# Patient Record
Sex: Female | Born: 1955 | Race: Black or African American | Hispanic: No | Marital: Single | Smoking: Former smoker
Health system: Southern US, Community
[De-identification: ages and names within clinical notes are randomized; demographics above are authoritative.]

## PROBLEM LIST (undated history)

## (undated) DIAGNOSIS — Z8489 Family history of other specified conditions: Secondary | ICD-10-CM

## (undated) DIAGNOSIS — Z972 Presence of dental prosthetic device (complete) (partial): Secondary | ICD-10-CM

## (undated) DIAGNOSIS — T8859XA Other complications of anesthesia, initial encounter: Secondary | ICD-10-CM

## (undated) DIAGNOSIS — I10 Essential (primary) hypertension: Secondary | ICD-10-CM

## (undated) DIAGNOSIS — T4145XA Adverse effect of unspecified anesthetic, initial encounter: Secondary | ICD-10-CM

## (undated) HISTORY — PX: ABDOMINAL HYSTERECTOMY: SHX81

## (undated) HISTORY — PX: COLONOSCOPY: SHX174

---

## 2015-12-09 ENCOUNTER — Emergency Department
Admission: EM | Admit: 2015-12-09 | Discharge: 2015-12-09 | Disposition: A | Discharge status: Home / Self Care | Payer: BLUE CROSS/BLUE SHIELD | Attending: Emergency Medicine | Admitting: Emergency Medicine

## 2015-12-09 ENCOUNTER — Emergency Department: Payer: BLUE CROSS/BLUE SHIELD

## 2015-12-09 ENCOUNTER — Encounter: Payer: Self-pay | Admitting: Emergency Medicine

## 2015-12-09 DIAGNOSIS — M5441 Lumbago with sciatica, right side: Secondary | ICD-10-CM | POA: Diagnosis not present

## 2015-12-09 DIAGNOSIS — I10 Essential (primary) hypertension: Secondary | ICD-10-CM | POA: Insufficient documentation

## 2015-12-09 DIAGNOSIS — R52 Pain, unspecified: Secondary | ICD-10-CM

## 2015-12-09 DIAGNOSIS — M545 Low back pain: Secondary | ICD-10-CM | POA: Diagnosis present

## 2015-12-09 HISTORY — DX: Essential (primary) hypertension: I10

## 2015-12-09 MED ORDER — NAPROXEN 500 MG PO TABS: 500.0000 mg | ORAL_TABLET | Freq: Two times a day (BID) | ORAL | Status: DC

## 2015-12-09 MED ORDER — OXYCODONE-ACETAMINOPHEN 5-325 MG PO TABS: 1.0000 | ORAL_TABLET | ORAL | Status: DC | PRN

## 2015-12-09 MED ORDER — OXYCODONE-ACETAMINOPHEN 5-325 MG PO TABS
2.0000 | ORAL_TABLET | Freq: Once | ORAL | Status: AC
  Administered 2015-12-09: 2 via ORAL
  Filled 2015-12-09: qty 2

## 2015-12-09 MED ORDER — KETOROLAC TROMETHAMINE 30 MG/ML IJ SOLN
30.0000 mg | Freq: Once | INTRAMUSCULAR | Status: AC
  Administered 2015-12-09: 30 mg via INTRAVENOUS
  Filled 2015-12-09: qty 1

## 2015-12-09 MED ORDER — CYCLOBENZAPRINE HCL 10 MG PO TABS: 10.0000 mg | ORAL_TABLET | Freq: Three times a day (TID) | ORAL | Status: DC | PRN

## 2015-12-09 NOTE — ED Notes (Signed)
Pt to ed with c/o back pain that started yesterday afternoon, worse with walking or activity.  Reports she could have injured it while at work yesterday but not exactly sure of when.

## 2015-12-09 NOTE — ED Provider Notes (Signed)
[facility] Emergency Department Provider Note  ____________________________________________  Time seen: Approximately 11:20 AM  I have reviewed the triage vital signs and the nursing notes.   HISTORY  Chief Complaint Back Pain    HPI Little Ishikawaamela Sternberg is a 60 y.o. female presents for violation of lower right-sided back pain that radiates down to the right leg. Patient denies any known injury patient states the symptoms started yesterday afternoon. Pain is worsened or exacerbated with walking or activity. Minimal relief with rest. She reports the possibility she could've injured it while at work yesterday but not exactly sure when. No known mechanism of injury. Patient is 10 out of 10 with radiation down her leg. [place] does not have a local provider established this time.   Past Medical History  Diagnosis Date  . Hypertension     There are no active problems to display for this patient.   History reviewed. No pertinent past surgical history.  No current outpatient prescriptions on file.  Allergies Review of patient's allergies indicates no known allergies.  History reviewed. No pertinent family history.  Social History Social History  Substance Use Topics  . Smoking status: Never Smoker   . Smokeless tobacco: None  . Alcohol Use: No    Review of Systems Constitutional: No fever/chills Cardiovascular: Denies chest pain. Respiratory: Denies shortness of breath. Gastrointestinal: No abdominal pain.  No nausea, no vomiting.  No diarrhea.  No constipation. Genitourinary: Negative for dysuria. Musculoskeletal: Positive for severe right lower quadrant back pain radiating down her right leg. Skin: Negative for rash. Neurological: Negative for headaches, focal weakness or numbness.  10-point ROS otherwise negative.  ____________________________________________   PHYSICAL EXAM: BP 106/51 mmHg  Pulse 73   Temp(Src) 98.2 F (36.8 C) (Oral)  Resp 18  SpO2 98%   VITAL SIGNS: ED Triage Vitals  Enc Vitals Group     BP --      Pulse --      Resp --      Temp --      Temp src --      SpO2 --      Weight --      Height --      Head Cir --      Peak Flow --      Pain Score 12/09/15 1100 10     Pain Loc --      Pain Edu? --      Excl. in GC? --     Constitutional: Alert and oriented. Well appearing and in no acute distress.   Cardiovascular: Normal rate, regular rhythm. Grossly normal heart sounds.  Good peripheral circulation. Respiratory: Normal respiratory effort.  No retractions. Lungs CTAB. Gastrointestinal: Soft and nontender. No distention. No abdominal bruits. No CVA tenderness. Musculoskeletal: No lower extremity tenderness nor edema.  No joint effusions. Straight leg raise negative distal neurovascularly intact bilaterally Neurologic:  Normal speech and language. No gross focal neurologic deficits are appreciated. No gait instability. Skin:  Skin is warm, dry and intact. No rash noted. Psychiatric: Mood and affect are normal. Speech and behavior are normal.  ____________________________________________   LABS (all labs ordered are listed, but only abnormal results are displayed)  Labs Reviewed - No data to display ____________________________________________   RADIOLOGY  FINDINGS: Frontal, lateral, and spot lumbosacral lateral images were obtained. There are 5 non-rib-bearing lumbar type vertebral bodies. There is no fracture or spondylolisthesis. There is mild disc space narrowing at L4-5 and  L5-S1. Other disc spaces appear unremarkable. There are anterior osteophytes at L3 and L4. There are foci of calcification in the abdominal aorta.  IMPRESSION: Disc space narrowing at L4-5 and L5-S1. No fracture or spondylolisthesis. Scattered foci of atherosclerotic calcification in the aorta. ____________________________________________   PROCEDURES  Procedure(s)  performed: None  Critical Care performed: No  ____________________________________________   INITIAL IMPRESSION / ASSESSMENT AND PLAN / ED COURSE  Pertinent labs & imaging results that were available during my care of the patient were reviewed by me and considered in my medical decision making (see chart for details).  Lower back pain with some disc narrowing. Rx given for Flexeril 10 mg Naprosyn 500 mg and Percocet 5/325. Patient to follow-up with her get [facility]Dr. Carole Binning as needed for continued low back pain. Patient voices no other emergency medical complaints at this time work excuse given 24-48 hours. ____________________________________________   FINAL CLINICAL IMPRESSION(S) / ED DIAGNOSES  Final diagnoses:  None     This chart was dictated using voice recognition software/Dragon. Despite best efforts to proofread, errors can occur which can change the meaning. Any change was purely unintentional.   Evangeline Dakin, PA-C 12/09/15 1249  Myrna Blazer, MD 12/09/15 [phone]

## 2015-12-09 NOTE — ED Notes (Signed)
Pt back from x-ray.

## 2015-12-09 NOTE — ED Notes (Signed)
Right lower back pain that radiates to right lower leg. No known injury. Pt took Tylenol for pain with no relief yesterday.

## 2015-12-09 NOTE — Discharge Instructions (Signed)
Cryotherapy °Cryotherapy means treatment with cold. Ice or gel packs can be used to reduce both pain and swelling. Ice is the most helpful within the first 24 to 48 hours after an injury or flare-up from overusing a muscle or joint. Sprains, strains, spasms, burning pain, shooting pain, and aches can all be eased with ice. Ice can also be used when recovering from surgery. Ice is effective, has very few side effects, and is safe for most people to use. °PRECAUTIONS  °Ice is not a safe treatment option for people with: °· Raynaud phenomenon. This is a condition affecting small blood vessels in the extremities. Exposure to cold may cause your problems to return. °· Cold hypersensitivity. There are many forms of cold hypersensitivity, including: °· Cold urticaria. Red, itchy hives appear on the skin when the tissues begin to warm after being iced. °· Cold erythema. This is a red, itchy rash caused by exposure to cold. °· Cold hemoglobinuria. Red blood cells break down when the tissues begin to warm after being iced. The hemoglobin that carry oxygen are passed into the urine because they cannot combine with blood proteins fast enough. °· Numbness or altered sensitivity in the area being iced. °If you have any of the following conditions, do not use ice until you have discussed cryotherapy with your caregiver: °· Heart conditions, such as arrhythmia, angina, or chronic heart disease. °· High blood pressure. °· Healing wounds or open skin in the area being iced. °· Current infections. °· Rheumatoid arthritis. °· Poor circulation. °· Diabetes. °Ice slows the blood flow in the region it is applied. This is beneficial when trying to stop inflamed tissues from spreading irritating chemicals to surrounding tissues. However, if you expose your skin to cold temperatures for too long or without the proper protection, you can damage your skin or nerves. Watch for signs of skin damage due to cold. °HOME CARE INSTRUCTIONS °Follow  these tips to use ice and cold packs safely. °· Place a dry or damp towel between the ice and skin. A damp towel will cool the skin more quickly, so you may need to shorten the time that the ice is used. °· For a more rapid response, add gentle compression to the ice. °· Ice for no more than 10 to 20 minutes at a time. The bonier the area you are icing, the less time it will take to get the benefits of ice. °· Check your skin after 5 minutes to make sure there are no signs of a poor response to cold or skin damage. °· Rest 20 minutes or more between uses. °· Once your skin is numb, you can end your treatment. You can test numbness by very lightly touching your skin. The touch should be so light that you do not see the skin dimple from the pressure of your fingertip. When using ice, most people will feel these normal sensations in this order: cold, burning, aching, and numbness. °· Do not use ice on someone who cannot communicate their responses to pain, such as small children or people with dementia. °HOW TO MAKE AN ICE PACK °Ice packs are the most common way to use ice therapy. Other methods include ice massage, ice baths, and cryosprays. Muscle creams that cause a cold, tingly feeling do not offer the same benefits that ice offers and should not be used as a substitute unless recommended by your caregiver. °To make an ice pack, do one of the following: °· Place crushed ice or a   bag of frozen vegetables in a sealable plastic bag. Squeeze out the excess air. Place this bag inside another plastic bag. Slide the bag into a pillowcase or place a damp towel between your skin and the bag.  Mix 3 parts water with 1 part rubbing alcohol. Freeze the mixture in a sealable plastic bag. When you remove the mixture from the freezer, it will be slushy. Squeeze out the excess air. Place this bag inside another plastic bag. Slide the bag into a pillowcase or place a damp towel between your skin and the bag. SEEK MEDICAL CARE  IF:  You develop white spots on your skin. This may give the skin a blotchy (mottled) appearance.  Your skin turns blue or pale.  Your skin becomes waxy or hard.  Your swelling gets worse. MAKE SURE YOU:   Understand these instructions.  Will watch your condition.  Will get help right away if you are not doing well or get worse.   This information is not intended to replace advice given to you by your health care provider. Make sure you discuss any questions you have with your health care provider.   Document Released: 05/03/2011 Document Revised: 09/27/2014 Document Reviewed: 05/03/2011 Elsevier Interactive Patient Education 2016 [place] therapy can help ease sore, stiff, injured, and tight muscles and joints. Heat relaxes your muscles, which may help ease your pain.  RISKS AND COMPLICATIONS If you have any of the following conditions, do not use heat therapy unless your health care provider has approved:  Poor circulation.  Healing wounds or scarred skin in the area being treated.  Diabetes, heart disease, or high blood pressure.  Not being able to feel (numbness) the area being treated.  Unusual swelling of the area being treated.  Active infections.  Blood clots.  Cancer.  Inability to communicate pain. This may include young children and people who have problems with their brain function (dementia).  Pregnancy. Heat therapy should only be used on old, pre-existing, or long-lasting (chronic) injuries. Do not use heat therapy on new injuries unless directed by your health care provider. HOW TO USE HEAT THERAPY There are several different kinds of heat therapy, including:  Moist heat pack.  Warm water bath.  Hot water bottle.  Electric heating pad.  Heated gel pack.  Heated wrap.  Electric heating pad. Use the heat therapy method suggested by your health care provider. Follow your health care provider's instructions on when  and how to use heat therapy. GENERAL HEAT THERAPY RECOMMENDATIONS  Do not sleep while using heat therapy. Only use heat therapy while you are awake.  Your skin may turn pink while using heat therapy. Do not use heat therapy if your skin turns red.  Do not use heat therapy if you have new pain.  High heat or long exposure to heat can cause burns. Be careful when using heat therapy to avoid burning your skin.  Do not use heat therapy on areas of your skin that are already irritated, such as with a rash or sunburn. SEEK MEDICAL CARE IF:  You have blisters, redness, swelling, or numbness.  You have new pain.  Your pain is worse. MAKE SURE YOU:  Understand these instructions.  Will watch your condition.  Will get help right away if you are not doing well or get worse.   This information is not intended to replace advice given to you by your health care provider. Make sure you discuss any questions you  have with your health care provider.   Document Released: 11/29/2011 Document Revised: 09/27/2014 Document Reviewed: 10/30/2013 Elsevier Interactive Patient Education 2016 [employer] Inc[employer]  Musculoskeletal Pain Musculoskeletal pain is muscle and boney aches and pains. These pains can occur in any part of the body. Your caregiver may treat you without knowing the cause of the pain. They may treat you if blood or urine tests, X-rays, and other tests were normal.  CAUSES There is often not a definite cause or reason for these pains. These pains may be caused by a type of germ (virus). The discomfort may also come from overuse. Overuse includes working out too hard when your body is not fit. Boney aches also come from weather changes. Bone is sensitive to atmospheric pressure changes. HOME CARE INSTRUCTIONS   Ask when your test results will be ready. Make sure you get your test results.  Only take over-the-counter or prescription medicines for pain, discomfort, or fever as directed by your  caregiver. If you were given medications for your condition, do not drive, operate machinery or power tools, or sign legal documents for 24 hours. Do not drink alcohol. Do not take sleeping pills or other medications that may interfere with treatment.  Continue all activities unless the activities cause more pain. When the pain lessens, slowly resume normal activities. Gradually increase the intensity and duration of the activities or exercise.  During periods of severe pain, bed rest may be helpful. Lay or sit in any position that is comfortable.  Putting ice on the injured area.  Put ice in a bag.  Place a towel between your skin and the bag.  Leave the ice on for 15 to 20 minutes, 3 to 4 times a day.  Follow up with your caregiver for continued problems and no reason can be found for the pain. If the pain becomes worse or does not go away, it may be necessary to repeat tests or do additional testing. Your caregiver may need to look further for a possible cause. SEEK IMMEDIATE MEDICAL CARE IF:  You have pain that is getting worse and is not relieved by medications.  You develop chest pain that is associated with shortness or breath, sweating, feeling sick to your stomach (nauseous), or throw up (vomit).  Your pain becomes localized to the abdomen.  You develop any new symptoms that seem different or that concern you. MAKE SURE YOU:   Understand these instructions.  Will watch your condition.  Will get help right away if you are not doing well or get worse.   This information is not intended to replace advice given to you by your health care provider. Make sure you discuss any questions you have with your health care provider.   Document Released: 09/06/2005 Document Revised: 11/29/2011 Document Reviewed: 05/11/2013 Elsevier Interactive Patient Education 2016 Elsevier Inc.  Sciatica Sciatica is pain, weakness, numbness, or tingling along the path of the sciatic nerve. The  nerve starts in the lower back and runs down the back of each leg. The nerve controls the muscles in the lower leg and in the back of the knee, while also providing sensation to the back of the thigh, lower leg, and the sole of your foot. Sciatica is a symptom of another medical condition. For instance, nerve damage or certain conditions, such as a herniated disk or bone spur on the spine, pinch or put pressure on the sciatic nerve. This causes the pain, weakness, or other sensations normally associated with sciatica. Generally,  sciatica only affects one side of the body. CAUSES   Herniated or slipped disc.  Degenerative disk disease.  A pain disorder involving the narrow muscle in the buttocks (piriformis syndrome).  Pelvic injury or fracture.  Pregnancy.  Tumor (rare). SYMPTOMS  Symptoms can vary from mild to very severe. The symptoms usually travel from the low back to the buttocks and down the back of the leg. Symptoms can include:  Mild tingling or dull aches in the lower back, leg, or hip.  Numbness in the back of the calf or sole of the foot.  Burning sensations in the lower back, leg, or hip.  Sharp pains in the lower back, leg, or hip.  Leg weakness.  Severe back pain inhibiting movement. These symptoms may get worse with coughing, sneezing, laughing, or prolonged sitting or standing. Also, being overweight may worsen symptoms. DIAGNOSIS  Your caregiver will perform a physical exam to look for common symptoms of sciatica. He or she may ask you to do certain movements or activities that would trigger sciatic nerve pain. Other tests may be performed to find the cause of the sciatica. These may include:  Blood tests.  X-rays.  Imaging tests, such as an MRI or CT scan. TREATMENT  Treatment is directed at the cause of the sciatic pain. Sometimes, treatment is not necessary and the pain and discomfort goes away on its own. If treatment is needed, your caregiver may  suggest:  Over-the-counter medicines to relieve pain.  Prescription medicines, such as anti-inflammatory medicine, muscle relaxants, or narcotics.  Applying heat or ice to the painful area.  Steroid injections to lessen pain, irritation, and inflammation around the nerve.  Reducing activity during periods of pain.  Exercising and stretching to strengthen your abdomen and improve flexibility of your spine. Your caregiver may suggest losing weight if the extra weight makes the back pain worse.  Physical therapy.  Surgery to eliminate what is pressing or pinching the nerve, such as a bone spur or part of a herniated disk. HOME CARE INSTRUCTIONS   Only take over-the-counter or prescription medicines for pain or discomfort as directed by your caregiver.  Apply ice to the affected area for 20 minutes, 3-4 times a day for the first 48-72 hours. Then try heat in the same way.  Exercise, stretch, or perform your usual activities if these do not aggravate your pain.  Attend physical therapy sessions as directed by your caregiver.  Keep all follow-up appointments as directed by your caregiver.  Do not wear high heels or shoes that do not provide proper support.  Check your mattress to see if it is too soft. A firm mattress may lessen your pain and discomfort. SEEK IMMEDIATE MEDICAL CARE IF:   You lose control of your bowel or bladder (incontinence).  You have increasing weakness in the lower back, pelvis, buttocks, or legs.  You have redness or swelling of your back.  You have a burning sensation when you urinate.  You have pain that gets worse when you lie down or awakens you at night.  Your pain is worse than you have experienced in the past.  Your pain is lasting longer than 4 weeks.  You are suddenly losing weight without reason. MAKE SURE YOU:  Understand these instructions.  Will watch your condition.  Will get help right away if you are not doing well or get  worse.   This information is not intended to replace advice given to you by your health care provider.  Make sure you discuss any questions you have with your health care provider.   Document Released: 08/31/2001 Document Revised: 05/28/2015 Document Reviewed: 01/16/2012 Elsevier Interactive Patient Education [employer]

## 2015-12-09 NOTE — ED Notes (Signed)
Patient transported to X-ray 

## 2016-04-09 ENCOUNTER — Encounter: Payer: Self-pay | Admitting: *Deleted

## 2016-04-09 DIAGNOSIS — M2011 Hallux valgus (acquired), right foot: Secondary | ICD-10-CM | POA: Insufficient documentation

## 2016-04-14 ENCOUNTER — Encounter
Admission: RE | Disposition: A | Discharge status: Home / Self Care | Payer: Self-pay | Source: Ambulatory Visit | Attending: Podiatry

## 2016-04-14 ENCOUNTER — Ambulatory Visit: Payer: BLUE CROSS/BLUE SHIELD | Admitting: Anesthesiology

## 2016-04-14 ENCOUNTER — Ambulatory Visit
Admission: RE | Admit: 2016-04-14 | Discharge: 2016-04-14 | Disposition: A | Discharge status: Home / Self Care | Payer: BLUE CROSS/BLUE SHIELD | Source: Ambulatory Visit | Attending: Podiatry | Admitting: Podiatry

## 2016-04-14 DIAGNOSIS — Z79899 Other long term (current) drug therapy: Secondary | ICD-10-CM | POA: Diagnosis not present

## 2016-04-14 DIAGNOSIS — Z90710 Acquired absence of both cervix and uterus: Secondary | ICD-10-CM | POA: Diagnosis not present

## 2016-04-14 DIAGNOSIS — M2011 Hallux valgus (acquired), right foot: Secondary | ICD-10-CM | POA: Diagnosis not present

## 2016-04-14 DIAGNOSIS — F17200 Nicotine dependence, unspecified, uncomplicated: Secondary | ICD-10-CM | POA: Insufficient documentation

## 2016-04-14 DIAGNOSIS — I10 Essential (primary) hypertension: Secondary | ICD-10-CM | POA: Diagnosis not present

## 2016-04-14 HISTORY — PX: HALLUX VALGUS LAPIDUS: SHX6626

## 2016-04-14 HISTORY — DX: Adverse effect of unspecified anesthetic, initial encounter: T41.45XA

## 2016-04-14 HISTORY — DX: Other complications of anesthesia, initial encounter: T88.59XA

## 2016-04-14 HISTORY — DX: Presence of dental prosthetic device (complete) (partial): Z97.2

## 2016-04-14 HISTORY — DX: Family history of other specified conditions: Z84.89

## 2016-04-14 SURGERY — BUNIONECTOMY, LAPIDUS
Anesthesia: Regional | Site: Foot | Laterality: Right | Wound class: Clean

## 2016-04-14 MED ORDER — ONDANSETRON HCL 4 MG/2ML IJ SOLN
4.0000 mg | Freq: Once | INTRAMUSCULAR | Status: AC
  Administered 2016-04-14: 4 mg via INTRAVENOUS

## 2016-04-14 MED ORDER — LACTATED RINGERS IV SOLN
INTRAVENOUS | Status: DC
  Administered 2016-04-14: 11:00:00 via INTRAVENOUS

## 2016-04-14 MED ORDER — OXYCODONE-ACETAMINOPHEN 5-325 MG PO TABS: 1.0000 | ORAL_TABLET | ORAL | 0 refills | Status: DC | PRN

## 2016-04-14 MED ORDER — BUPIVACAINE HCL (PF) 0.25 % IJ SOLN
INTRAMUSCULAR | Status: DC | PRN
  Administered 2016-04-14: 16 mL

## 2016-04-14 MED ORDER — PROPOFOL 10 MG/ML IV BOLUS
INTRAVENOUS | Status: DC | PRN
  Administered 2016-04-14: 200 mg via INTRAVENOUS

## 2016-04-14 MED ORDER — OXYCODONE HCL 5 MG PO TABS: 5.0000 mg | ORAL_TABLET | Freq: Once | ORAL | Status: DC | PRN

## 2016-04-14 MED ORDER — ROPIVACAINE HCL 5 MG/ML IJ SOLN
INTRAMUSCULAR | Status: DC | PRN
  Administered 2016-04-14: 30 mL via EPIDURAL

## 2016-04-14 MED ORDER — FENTANYL CITRATE (PF) 100 MCG/2ML IJ SOLN
50.0000 ug | INTRAMUSCULAR | Status: DC | PRN
  Administered 2016-04-14: 100 ug via INTRAVENOUS

## 2016-04-14 MED ORDER — DEXAMETHASONE SODIUM PHOSPHATE 4 MG/ML IJ SOLN
INTRAMUSCULAR | Status: DC | PRN
  Administered 2016-04-14: 4 mg via INTRAVENOUS

## 2016-04-14 MED ORDER — MIDAZOLAM HCL 2 MG/2ML IJ SOLN
1.0000 mg | INTRAMUSCULAR | Status: DC | PRN
  Administered 2016-04-14: 2 mg via INTRAVENOUS

## 2016-04-14 MED ORDER — GLYCOPYRROLATE 0.2 MG/ML IJ SOLN
INTRAMUSCULAR | Status: DC | PRN
  Administered 2016-04-14: 0.2 mg via INTRAVENOUS

## 2016-04-14 MED ORDER — ONDANSETRON HCL 4 MG/2ML IJ SOLN
INTRAMUSCULAR | Status: DC | PRN
  Administered 2016-04-14: 4 mg via INTRAVENOUS

## 2016-04-14 MED ORDER — OXYCODONE HCL 5 MG/5ML PO SOLN: 5.0000 mg | Freq: Once | ORAL | Status: DC | PRN

## 2016-04-14 MED ORDER — LIDOCAINE HCL (CARDIAC) 20 MG/ML IV SOLN
INTRAVENOUS | Status: DC | PRN
  Administered 2016-04-14: 30 mg via INTRATRACHEAL

## 2016-04-14 MED ORDER — PROMETHAZINE HCL 25 MG/ML IJ SOLN
6.2500 mg | INTRAMUSCULAR | Status: DC | PRN
  Administered 2016-04-14: 6.25 mg via INTRAVENOUS

## 2016-04-14 SURGICAL SUPPLY — 43 items
BANDAGE ELASTIC 4 VELCRO NS (GAUZE/BANDAGES/DRESSINGS) ×2 IMPLANT
BENZOIN TINCTURE PRP APPL 2/3 (GAUZE/BANDAGES/DRESSINGS) ×2 IMPLANT
BLADE MED AGGRESSIVE (BLADE) IMPLANT
BLADE OSC/SAGITTAL MD 5.5X18 (BLADE) IMPLANT
BLADE SURG 15 STRL LF DISP TIS (BLADE) IMPLANT
BLADE SURG 15 STRL SS (BLADE)
BNDG ESMARK 4X12 TAN STRL LF (GAUZE/BANDAGES/DRESSINGS) ×2 IMPLANT
BNDG GAUZE 4.5X4.1 6PLY STRL (MISCELLANEOUS) ×2 IMPLANT
BNDG STRETCH 4X75 STRL LF (GAUZE/BANDAGES/DRESSINGS) ×2 IMPLANT
CANISTER SUCT 1200ML W/VALVE (MISCELLANEOUS) ×2 IMPLANT
CONTROL 360 (Bone Implant) ×2 IMPLANT
COVER LIGHT HANDLE UNIVERSAL (MISCELLANEOUS) ×4 IMPLANT
CUFF TOURN SGL QUICK 18 (TOURNIQUET CUFF) ×2 IMPLANT
DRAPE FLUOR MINI C-ARM 54X84 (DRAPES) ×2 IMPLANT
DURAPREP 26ML APPLICATOR (WOUND CARE) ×2 IMPLANT
GAUZE PETRO XEROFOAM 1X8 (MISCELLANEOUS) ×2 IMPLANT
GAUZE SPONGE 4X4 12PLY STRL (GAUZE/BANDAGES/DRESSINGS) ×2 IMPLANT
GLOVE BIO SURGEON STRL SZ7.5 (GLOVE) ×2 IMPLANT
GLOVE INDICATOR 8.0 STRL GRN (GLOVE) ×2 IMPLANT
GOWN STRL REUS W/ TWL LRG LVL3 (GOWN DISPOSABLE) ×2 IMPLANT
GOWN STRL REUS W/TWL LRG LVL3 (GOWN DISPOSABLE) ×2
K-WIRE DBL END TROCAR 6X.045 (WIRE)
K-WIRE DBL END TROCAR 6X.062 (WIRE)
KIT ROOM TURNOVER OR (KITS) ×2 IMPLANT
KWIRE DBL END TROCAR 6X.045 (WIRE) IMPLANT
KWIRE DBL END TROCAR 6X.062 (WIRE) IMPLANT
NS IRRIG 500ML POUR BTL (IV SOLUTION) ×2 IMPLANT
PACK EXTREMITY ARMC (MISCELLANEOUS) ×2 IMPLANT
PAD GROUND ADULT SPLIT (MISCELLANEOUS) ×2 IMPLANT
PIN BALLS 3/8 F/.045 WIRE (MISCELLANEOUS) IMPLANT
RASP SM TEAR CROSS CUT (RASP) ×2 IMPLANT
SAW BLADE OMEGA SURGICAL INST (SAW) ×2 IMPLANT
SPLINT CAST 1 STEP 4X30 (MISCELLANEOUS) ×2 IMPLANT
STOCKINETTE STRL 6IN 960660 (GAUZE/BANDAGES/DRESSINGS) ×2 IMPLANT
STRAP BODY AND KNEE 60X3 (MISCELLANEOUS) ×2 IMPLANT
STRIP CLOSURE SKIN 1/4X4 (GAUZE/BANDAGES/DRESSINGS) ×2 IMPLANT
SUT MNCRL 5-0+ PC-1 (SUTURE) ×1 IMPLANT
SUT MONOCRYL 5-0 (SUTURE) ×1
SUT VIC AB 2-0 SH 27 (SUTURE)
SUT VIC AB 2-0 SH 27XBRD (SUTURE) IMPLANT
SUT VIC AB 3-0 SH 27 (SUTURE) ×1
SUT VIC AB 3-0 SH 27X BRD (SUTURE) ×1 IMPLANT
SUT VIC AB 4-0 FS2 27 (SUTURE) ×2 IMPLANT

## 2016-04-14 NOTE — Anesthesia Preprocedure Evaluation (Addendum)
Anesthesia Evaluation  Patient identified by MRN, date of birth, ID band Patient awake    Reviewed: Allergy & Precautions, NPO status , Patient's Chart, lab work & pertinent test results  Airway Mallampati: II  TM Distance: >3 FB Neck ROM: Full    Dental no notable dental hx.    Pulmonary neg pulmonary ROS, Current Smoker,    Pulmonary exam normal breath sounds clear to auscultation       Cardiovascular hypertension, negative cardio ROS Normal cardiovascular exam Rhythm:Regular Rate:Normal     Neuro/Psych negative neurological ROS  negative psych ROS   GI/Hepatic negative GI ROS, Neg liver ROS,   Endo/Other  negative endocrine ROS  Renal/GU negative Renal ROS  negative genitourinary   Musculoskeletal negative musculoskeletal ROS (+)   Abdominal   Peds negative pediatric ROS (+)  Hematology negative hematology ROS (+)   Anesthesia Other Findings   Reproductive/Obstetrics negative OB ROS                             Anesthesia Physical Anesthesia Plan  ASA: II  Anesthesia Plan: General and Regional   Post-op Pain Management:    Induction: Intravenous  Airway Management Planned:   Additional Equipment:   Intra-op Plan:   Post-operative Plan: Extubation in OR  Informed Consent: I have reviewed the patients History and Physical, chart, labs and discussed the procedure including the risks, benefits and alternatives for the proposed anesthesia with the patient or authorized representative who has indicated his/her understanding and acceptance.   Dental advisory given  Plan Discussed with: CRNA  Anesthesia Plan Comments: (POP block plus LMA)       Anesthesia Quick Evaluation

## 2016-04-14 NOTE — Anesthesia Postprocedure Evaluation (Signed)
Anesthesia Post Note  Patient: Kim Munoz  Procedure(s) Performed: Procedure(s) (LRB): HALLUX VALGUS LAPIDUS CORRECTION RIGHT FOOT (Right)  Patient location during evaluation: PACU Anesthesia Type: General and Regional Level of consciousness: awake and alert Pain management: pain level controlled Vital Signs Assessment: post-procedure vital signs reviewed and stable Respiratory status: spontaneous breathing, nonlabored ventilation, respiratory function stable and patient connected to nasal cannula oxygen Cardiovascular status: blood pressure returned to baseline and stable Postop Assessment: no signs of nausea or vomiting Anesthetic complications: no    Leaira Fullam C

## 2016-04-14 NOTE — Discharge Instructions (Signed)
[facility] °[facility] ° °POST OPERATIVE INSTRUCTIONS FOR DR. TROXLER AND DR. Elwyn Klosinski °[facility] ° ° °1. Take your medication as prescribed.  Pain medication should be taken only as needed. ° °2. Keep the dressing clean, dry and intact. ° °3. Keep your foot elevated above the heart level for the first 48 hours. ° °4. Walking to the bathroom and brief periods of walking are acceptable, unless we have instructed you to be non-weight bearing. ° °5. Always wear your post-op shoe when walking.  Always use your crutches if you are to be non-weight bearing. ° °6. Do not take a shower.   ° °7. Every hour you are awake:  °- Bend your knee 15 times. °- Massage calf 15 times ° °8. Call [facility] ([phone]) if any of the following problems occur: °- You develop a temperature or fever. °- The bandage becomes saturated with blood. °- Medication does not stop your pain. °- Injury of the foot occurs. °- Any symptoms of infection including redness, odor, or red streaks running from wound. ° °General Anesthesia, Adult, Care After °Refer to this sheet in the next few weeks. These instructions provide you with information on caring for yourself after your procedure. Your health care provider may also give you more specific instructions. Your treatment has been planned according to current medical practices, but problems sometimes occur. Call your health care provider if you have any problems or questions after your procedure. °WHAT TO EXPECT AFTER THE PROCEDURE °After the procedure, it is typical to experience: °· Sleepiness. °· Nausea and vomiting. °HOME CARE INSTRUCTIONS °· For the first 24 hours after general anesthesia: °¨ Have a responsible person with you. °¨ Do not drive a car. If you are alone, do not take public transportation. °¨ Do not drink alcohol. °¨ Do not take medicine that has not been prescribed by your health care provider. °¨ Do not sign important  papers or make important decisions. °¨ You may resume a normal diet and activities as directed by your health care provider. °· Change bandages (dressings) as directed. °· If you have questions or problems that seem related to general anesthesia, call the hospital and ask for the anesthetist or anesthesiologist on call. °SEEK MEDICAL CARE IF: °· You have nausea and vomiting that continue the day after anesthesia. °· You develop a rash. °SEEK IMMEDIATE MEDICAL CARE IF:  °· You have difficulty breathing. °· You have chest pain. °· You have any allergic problems. °  °This information is not intended to replace advice given to you by your health care provider. Make sure you discuss any questions you have with your health care provider. °  °Document Released: 12/13/2000 Document Revised: 09/27/2014 Document Reviewed: 01/05/2012 °Elsevier Interactive Patient Education ©2016 [employer] ° °

## 2016-04-14 NOTE — Anesthesia Procedure Notes (Signed)
Anesthesia Regional Block:  Popliteal block  Pre-Anesthetic Checklist: ,, timeout performed, Correct Patient, Correct Site, Correct Laterality, Correct Procedure, Correct Position, site marked, Risks and benefits discussed,  Surgical consent,  Pre-op evaluation,  At surgeon's request and post-op pain management  Laterality: Right  Prep: chloraprep       Needles:  Injection technique: Single-shot  Needle Type: Echogenic Needle     Needle Length: 9cm 9 cm Needle Gauge: 21 and 21 G    Additional Needles:  Procedures: ultrasound guided (picture in chart) Popliteal block Narrative:  Start time: 04/14/2016 11:25 AM End time: 04/14/2016 11:30 AM Injection made incrementally with aspirations every 5 mL.  Performed by: Personally   Additional Notes: Functioning IV was confirmed and monitors applied. Ultrasound guidance: relevant anatomy identified, needle position confirmed, local anesthetic spread visualized around nerve(s)., vascular puncture avoided.  Image printed for medical record.  Negative aspiration and no paresthesias; incremental administration of local anesthetic. The patient tolerated the procedure well. Vitals signes recorded in RN notes.  No needle to nerve contact. No complications. Nerve, local, needle, blood vessels all well visualized throughout procedure.

## 2016-04-14 NOTE — Op Note (Signed)
Operative note   Surgeon:Shoua Ressler Lawyer: None    Preop diagnosis: Right foot hallux valgus    Postop diagnosis: Same    Procedure: Lapidus hallux valgus correction right foot    EBL: Minimal    Anesthesia:regional and general    Hemostasis: Ankle tourniquet inflated to 250 mmHg for 99 minutes    Specimen: None    Complications: None    Operative indications:Kim Munoz is an 60 y.o. that presents today for surgical intervention.  The risks/benefits/alternatives/complications have been discussed and consent has been given.    Procedure:  Patient was brought into the OR and placed on the operating table in thesupine position. After anesthesia was obtained theright lower extremity was prepped and draped in usual sterile fashion.  Attention was directed to the right first metatarsal and met cuneiform joint. A longitudinal incision was made overlying the long extensor tendon. Sharp and blunt dissection carried down to the periosteum. Just medial to the extensor tendon the subperiosteal dissection was undertaken. This exposed the first met cuneiform joint. At this time the joint was then freed of the dorsomedial 6 attachments. A sagittal saw blade was placed into the joint and free the plantar ligamentous structures. The metatarsals mobilized and I was able to rotate this in a better position. Next a distal dissection was undertaken. The joint positioner was then placed. The was placed between the bases of the first metatarsal and second metatarsal region. The joint positioner was then placed on the midtarsal joint region and realignment was noted after repositioning the first metatarsal. A 0 intermetatarsal angle was noted. Next the joint seeker was placed into the joint and the osteotomy cut guide was placed on the dorsal aspect of the first met cuneiform joint. This was then stabilized with K wires. A 7 cut was placed on the cuneiform with the base of the first metatarsal  excised. All loose bodies were then removed. The articular surfaces were then prepared with the 2.0 mm long drill bit. Good bone graft was left within the met cuneiform fusion site. The joint was then manually compressed. It was then compressed with a compression and a stabilizing wire medially. Final placement of plates and screws were placed on the dorsal and medial aspect. This was from theTreace lapiplasty set. Good stability was noted in all planes with excellent realignment noted. All residual K wires and positions were removed prior to final placement. At this time attention was then directed to the dorsal medial first MTPJ where an incision was made. Sharp and blunt dissection was carried down to the capsule a T capsulotomy was performed. The dorsomedial eminence was noted and transected with a power saw and smoothed with a power rasp. The intermetatarsal space was entered the DTI L was transected. The conjoined tendon of the abductor was released and the sesamoid apparatus was freed up. Good realignment of the MTPJ was noted at this time. All wounds were flushed with copious pus or irrigation. Layered closure was performed with a 3-0 Vicryl for the deeper and capsulorrhaphy layer. A small capsulorrhaphy was performed. 4-0 Vicryl for the subcutaneous tissue and a 5-0 Monocryl for the skin. The small stab incision overlying the second metatarsal was closed with a 4-0 nylon. All areas were then infiltrated with 0.25% Marcaine. A well compressive sterile dressing was applied and the patient was placed in a posterior splint.    Patient tolerated the procedure and anesthesia well.  Was transported from the OR to the PACU  with all vital signs stable and vascular status intact. To be discharged per routine protocol.  Will follow up in approximately 1 week in the outpatient clinic. \

## 2016-04-14 NOTE — Anesthesia Procedure Notes (Signed)
Procedure Name: LMA Insertion Date/Time: 04/14/2016 12:07 PM Performed by: Andee Poles Pre-anesthesia Checklist: Patient identified, Emergency Drugs available, Suction available, Timeout performed and Patient being monitored Patient Re-evaluated:Patient Re-evaluated prior to inductionOxygen Delivery Method: Circle system utilized Preoxygenation: Pre-oxygenation with 100% oxygen Intubation Type: IV induction LMA: LMA inserted LMA Size: 4.0 Number of attempts: 1 Placement Confirmation: positive ETCO2 and breath sounds checked- equal and bilateral Tube secured with: Tape

## 2016-04-14 NOTE — Transfer of Care (Signed)
Immediate Anesthesia Transfer of Care Note  Patient: Munachiso Leonhart  Procedure(s) Performed: Procedure(s) with comments: HALLUX VALGUS LAPIDUS (Right) - WITH POPLITEAL BLOCK  Patient Location: PACU  Anesthesia Type: General, Regional  Level of Consciousness: awake, alert  and patient cooperative  Airway and Oxygen Therapy: Patient Spontanous Breathing and Patient connected to supplemental oxygen  Post-op Assessment: Post-op Vital signs reviewed, Patient's Cardiovascular Status Stable, Respiratory Function Stable, Patent Airway and No signs of Nausea or vomiting  Post-op Vital Signs: Reviewed and stable  Complications: No apparent anesthesia complications

## 2016-04-14 NOTE — H&P (Signed)
  HISTORY AND PHYSICAL INTERVAL NOTE:  04/14/2016  11:40 AM  Kim Munoz  has presented today for surgery, with the diagnosis of M20.11 HALLUX VALGUS RIGHT.  The various methods of treatment have been discussed with the patient.  No guarantees were given.  After consideration of risks, benefits and other options for treatment, the patient has consented to surgery.  I have reviewed the patients' chart and labs.    Patient Vitals for the past 24 hrs:  BP Temp Temp src Pulse Resp SpO2 Weight  04/14/16 1130 (!) 112/92 - - 64 16 100 % -  04/14/16 1125 114/72 - - (!) 59 15 100 % -  04/14/16 1120 117/85 - - (!) 58 20 100 % -  04/14/16 1048 139/90 97.8 F (36.6 C) Temporal 69 - 99 % 79.4 kg (175 lb)    A history and physical examination was performed in my office.  The patient was reexamined.  There have been no changes to this history and physical examination.Plan for lapidus hallux valgus correction.  Gwyneth Revels A

## 2016-04-16 ENCOUNTER — Encounter: Payer: Self-pay | Admitting: Podiatry

## 2016-05-10 DIAGNOSIS — Z7185 Encounter for immunization safety counseling: Secondary | ICD-10-CM | POA: Insufficient documentation

## 2016-05-10 DIAGNOSIS — E669 Obesity, unspecified: Secondary | ICD-10-CM | POA: Insufficient documentation

## 2016-06-11 ENCOUNTER — Observation Stay
Admission: EM | Admit: 2016-06-11 | Discharge: 2016-06-13 | Disposition: A | Discharge status: Home / Self Care | Payer: BLUE CROSS/BLUE SHIELD | Attending: Internal Medicine | Admitting: Internal Medicine

## 2016-06-11 ENCOUNTER — Encounter: Payer: Self-pay | Admitting: Emergency Medicine

## 2016-06-11 DIAGNOSIS — R197 Diarrhea, unspecified: Secondary | ICD-10-CM | POA: Diagnosis present

## 2016-06-11 DIAGNOSIS — Z90710 Acquired absence of both cervix and uterus: Secondary | ICD-10-CM | POA: Insufficient documentation

## 2016-06-11 DIAGNOSIS — K529 Noninfective gastroenteritis and colitis, unspecified: Principal | ICD-10-CM | POA: Insufficient documentation

## 2016-06-11 DIAGNOSIS — Z9889 Other specified postprocedural states: Secondary | ICD-10-CM | POA: Diagnosis not present

## 2016-06-11 DIAGNOSIS — F17210 Nicotine dependence, cigarettes, uncomplicated: Secondary | ICD-10-CM | POA: Diagnosis not present

## 2016-06-11 DIAGNOSIS — Z8249 Family history of ischemic heart disease and other diseases of the circulatory system: Secondary | ICD-10-CM | POA: Insufficient documentation

## 2016-06-11 DIAGNOSIS — R112 Nausea with vomiting, unspecified: Secondary | ICD-10-CM | POA: Diagnosis present

## 2016-06-11 DIAGNOSIS — I10 Essential (primary) hypertension: Secondary | ICD-10-CM | POA: Insufficient documentation

## 2016-06-11 LAB — URINALYSIS COMPLETE WITH MICROSCOPIC (ARMC ONLY)
BILIRUBIN URINE: NEGATIVE
Glucose, UA: NEGATIVE mg/dL
Hgb urine dipstick: NEGATIVE
KETONES UR: NEGATIVE mg/dL
LEUKOCYTES UA: NEGATIVE
NITRITE: NEGATIVE
Protein, ur: NEGATIVE mg/dL
SPECIFIC GRAVITY, URINE: 1.014 (ref 1.005–1.030)
pH: 5 (ref 5.0–8.0)

## 2016-06-11 LAB — COMPREHENSIVE METABOLIC PANEL
ALBUMIN: 3.9 g/dL (ref 3.5–5.0)
ALK PHOS: 35 U/L — AB (ref 38–126)
ALT: 40 U/L (ref 14–54)
ANION GAP: 9 (ref 5–15)
AST: 27 U/L (ref 15–41)
BILIRUBIN TOTAL: 0.7 mg/dL (ref 0.3–1.2)
BUN: 13 mg/dL (ref 6–20)
CALCIUM: 9.6 mg/dL (ref 8.9–10.3)
CO2: 23 mmol/L (ref 22–32)
Chloride: 104 mmol/L (ref 101–111)
Creatinine, Ser: 0.87 mg/dL (ref 0.44–1.00)
GFR calc non Af Amer: >60 mL/min (ref >60)
GLUCOSE: 110 mg/dL — AB (ref 65–99)
POTASSIUM: 3.8 mmol/L (ref 3.5–5.1)
SODIUM: 136 mmol/L (ref 135–145)
TOTAL PROTEIN: 7.7 g/dL (ref 6.5–8.1)

## 2016-06-11 LAB — CBC WITH DIFFERENTIAL/PLATELET
Basophils Absolute: 0.1 10*3/uL (ref 0–0.1)
Basophils Relative: 1 %
Eosinophils Absolute: 0.3 10*3/uL (ref 0–0.7)
Eosinophils Relative: 3 %
HCT: 42.3 % (ref 35.0–47.0)
Hemoglobin: 14.8 g/dL (ref 12.0–16.0)
Lymphocytes Relative: 25 %
Lymphs Abs: 2.3 10*3/uL (ref 1.0–3.6)
MCH: 28.7 pg (ref 26.0–34.0)
MCHC: 35 g/dL (ref 32.0–36.0)
MCV: 82.1 fL (ref 80.0–100.0)
Monocytes Absolute: 0.6 10*3/uL (ref 0.2–0.9)
Monocytes Relative: 6 %
Neutro Abs: 6.1 10*3/uL (ref 1.4–6.5)
Neutrophils Relative %: 65 %
Platelets: 285 10*3/uL (ref 150–440)
RBC: 5.15 MIL/uL (ref 3.80–5.20)
RDW: 15.3 % — ABNORMAL HIGH (ref 11.5–14.5)
WBC: 9.4 10*3/uL (ref 3.6–11.0)

## 2016-06-11 LAB — LIPASE, BLOOD: Lipase: 26 U/L (ref 11–51)

## 2016-06-11 MED ORDER — SODIUM CHLORIDE 0.9 % IV SOLN
Freq: Once | INTRAVENOUS | Status: AC
  Administered 2016-06-11: 1000 mL via INTRAVENOUS

## 2016-06-11 MED ORDER — PROMETHAZINE HCL 25 MG/ML IJ SOLN
25.0000 mg | Freq: Once | INTRAMUSCULAR | Status: AC
  Administered 2016-06-11: 25 mg via INTRAVENOUS
  Filled 2016-06-11: qty 1

## 2016-06-11 MED ORDER — ONDANSETRON 4 MG PO TBDP: 4.0000 mg | ORAL_TABLET | Freq: Three times a day (TID) | ORAL | 0 refills | Status: DC | PRN

## 2016-06-11 MED ORDER — PROMETHAZINE HCL 25 MG/ML IJ SOLN
INTRAMUSCULAR | Status: AC
  Administered 2016-06-11: 25 mg via INTRAVENOUS
  Filled 2016-06-11: qty 1

## 2016-06-11 MED ORDER — ONDANSETRON 4 MG PO TBDP
4.0000 mg | ORAL_TABLET | Freq: Once | ORAL | Status: AC
  Administered 2016-06-11: 4 mg via ORAL

## 2016-06-11 MED ORDER — SODIUM CHLORIDE 0.9 % IV SOLN
Freq: Once | INTRAVENOUS | Status: AC
  Administered 2016-06-11: 22:00:00 via INTRAVENOUS

## 2016-06-11 MED ORDER — ONDANSETRON 4 MG PO TBDP
ORAL_TABLET | ORAL | Status: AC
  Administered 2016-06-11: 4 mg via ORAL
  Filled 2016-06-11: qty 1

## 2016-06-11 NOTE — Discharge Instructions (Signed)
Use the Zofran orally disintegrating tablet 3 times a day to help with the nausea. Try some Pepto-Bismol as directed for the diarrhea. Return for lightheadedness, fever, worse pain, if you can't keep down fluids feeling sicker or if you're not any better in 2 days or see your doctor.

## 2016-06-11 NOTE — ED Triage Notes (Signed)
C/o n/v/d.  NAD

## 2016-06-11 NOTE — ED Notes (Signed)
Pt given cup of water for PO challenge

## 2016-06-11 NOTE — ED Provider Notes (Signed)
[facility] Emergency Department Provider Note   ____________________________________________   First MD Initiated Contact with Patient 06/11/16 2008     (approximate)  I have reviewed the triage vital signs and the nursing notes.   HISTORY  Chief Complaint Emesis   HPI Kim Munoz is a 60 y.o. female patient complains of nausea vomiting and diarrhea for 3 days. Patient has no fever. Patient's husband's mother also has diarrhea and lives with them. When also has diarrhea. no one remembers eating any bad food. Patient complains of upper abdominal pain especially in the epigastric area. Patient is unable to keep down any food or water or any other liquid.   Past Medical History:  Diagnosis Date  . Complication of anesthesia    nausea after colonoscopy  . Family history of adverse reaction to anesthesia    sister - nausea aafter surgery  . Hypertension   . Wears dentures    full upper and lower    There are no active problems to display for this patient.   Past Surgical History:  Procedure Laterality Date  . ABDOMINAL HYSTERECTOMY    . COLONOSCOPY    . HALLUX VALGUS LAPIDUS Right 04/14/2016   Procedure: HALLUX VALGUS LAPIDUS CORRECTION RIGHT FOOT;  Surgeon: Gwyneth RevelsJustin Fowler, DPM;  [facility];  Service: Podiatry;  Laterality: Right;  WITH POPLITEAL BLOCK    Prior to Admission medications   Medication Sig Start Date End Date Taking? Authorizing Provider  amLODipine (NORVASC) 10 MG tablet Take 10 mg by mouth daily.    Historical Provider, MD  cyclobenzaprine (FLEXERIL) 10 MG tablet Take 1 tablet (10 mg total) by mouth every 8 (eight) hours as needed for muscle spasms. 12/09/15   Evangeline Dakinharles M Beers, PA-C  naproxen (NAPROSYN) 500 MG tablet Take 1 tablet (500 mg total) by mouth 2 (two) times daily with a meal. 12/09/15   Evangeline Dakinharles M Beers, PA-C  oxyCODONE-acetaminophen (ROXICET) 5-325 MG tablet Take 1-2 tablets by mouth every 4 (four) hours  as needed for severe pain. 12/09/15   Evangeline Dakinharles M Beers, PA-C  oxyCODONE-acetaminophen (ROXICET) 5-325 MG tablet Take 1-2 tablets by mouth every 4 (four) hours as needed for severe pain. 04/14/16   Gwyneth RevelsJustin Fowler, DPM  terbinafine (LAMISIL) 250 MG tablet Take 250 mg by mouth daily.    Historical Provider, MD    Allergies Review of patient's allergies indicates no known allergies.  No family history on file.  Social History Social History  Substance Use Topics  . Smoking status: Current Every Day Smoker    Packs/day: 0.50    Years: 35.00    Types: Cigarettes  . Smokeless tobacco: Never Used  . Alcohol use Yes     Comment: 2 drinks/mo    Review of Systems Constitutional: No fever/chills Eyes: No visual changes. ENT: No sore throat. Cardiovascular: Denies chest pain. Respiratory: Denies shortness of breath. Gastrointestinal:  See history of present illness Genitourinary: Negative for dysuria. Musculoskeletal: Negative for back pain. Skin: Negative for rash. Neurological: Negative for headaches, focal weakness or numbness.  10-point ROS otherwise negative.  ____________________________________________   PHYSICAL EXAM:  VITAL SIGNS: ED Triage Vitals  Enc Vitals Group     BP 06/11/16 1900 (!) 135/98     Pulse Rate 06/11/16 1900 95     Resp 06/11/16 1900 16     Temp 06/11/16 1900 98.1 F (36.7 C)     Temp Source 06/11/16 1900 Oral     SpO2 06/11/16 1900 99 %  Weight --      Height --      Head Circumference --      Peak Flow --      Pain Score 06/11/16 1856 8     Pain Loc --      Pain Edu? --      Excl. in GC? --    Constitutional: Alert and oriented. Well appearing and in no acute distress. Eyes: Conjunctivae are normal. PERRL. EOMI. Head: Atraumatic. Nose: No congestion/rhinnorhea. Mouth/Throat: Mucous membranes are moist.  Oropharynx non-erythematous. Neck: No stridor. Cardiovascular: Normal rate, regular rhythm. Grossly normal heart sounds.  Good  peripheral circulation. Respiratory: Normal respiratory effort.  No retractions. Lungs CTAB. Gastrointestinal: Soft and nontender except for an immediate epigastric area.. No distention. No abdominal bruits. No CVA tenderness. Musculoskeletal: No lower extremity tenderness nor edema.  No joint effusions. Neurologic:  Normal speech and language. No gross focal neurologic deficits are appreciated. No gait instability. Skin:  Skin is warm, dry and intact. No rash noted.   ____________________________________________   LABS (all labs ordered are listed, but only abnormal results are displayed)  Labs Reviewed  CBC WITH DIFFERENTIAL/PLATELET - Abnormal; Notable for the following:       Result Value   RDW 15.3 (*)    All other components within normal limits  COMPREHENSIVE METABOLIC PANEL - Abnormal; Notable for the following:    Glucose, Bld 110 (*)    Alkaline Phosphatase 35 (*)    All other components within normal limits  URINALYSIS COMPLETEWITH MICROSCOPIC ([facility] ONLY) - Abnormal; Notable for the following:    Color, Urine YELLOW (*)    APPearance HAZY (*)    Bacteria, UA RARE (*)    Squamous Epithelial / LPF 6-30 (*)    All other components within normal limits  LIPASE, BLOOD   ____________________________________________  EKG  EKG read and interpreted by me shows normal sinus rhythm rate of 93 normal axis no acute ST-T wave changes ____________________________________________  RADIOLOGY   ____________________________________________   PROCEDURES  Procedure(s) performed:   Procedures  Critical Care performed:   ____________________________________________   INITIAL IMPRESSION / ASSESSMENT AND PLAN / ED COURSE  Pertinent labs & imaging results that were available during my care of the patient were reviewed by me and considered in my medical decision making (see chart for details). After Zofran and Phenergan patient is able to keep fluids down. She is now  sleepy. She reports she has abdominal cramps immediately prior to having diarrhea. Otherwise her belly is not hurting anymore.   Clinical Course     ____________________________________________   FINAL CLINICAL IMPRESSION(S) / ED DIAGNOSES  Final diagnoses:  Nausea vomiting and diarrhea      NEW MEDICATIONS STARTED DURING THIS VISIT:  New Prescriptions   No medications on file     Note:  This document was prepared using Dragon voice recognition software and may include unintentional dictation errors.    Arnaldo Natal, MD 06/11/16 2239

## 2016-06-11 NOTE — ED Notes (Signed)
Pt in via triage with complaints of N/V/D since Wednesday.  Pt complains of right and left upper quadrant abdominal pain since then as well.  Pt reports unable to keep anything solid down and has therefore not eaten since Wed.  Pt A/Ox4, no immediate distress noted at this time.

## 2016-06-11 NOTE — ED Notes (Signed)
Pt to bathroom via wheelchair with one assist.   Pt unable to have BM at this time.

## 2016-06-12 DIAGNOSIS — R112 Nausea with vomiting, unspecified: Secondary | ICD-10-CM | POA: Diagnosis present

## 2016-06-12 LAB — C DIFFICILE QUICK SCREEN W PCR REFLEX
C Diff antigen: NEGATIVE
C Diff interpretation: NOT DETECTED
C Diff toxin: NEGATIVE

## 2016-06-12 LAB — TSH: TSH: 5.184 u[IU]/mL — ABNORMAL HIGH (ref 0.350–4.500)

## 2016-06-12 MED ORDER — ENOXAPARIN SODIUM 40 MG/0.4ML ~~LOC~~ SOLN
40.0000 mg | SUBCUTANEOUS | Status: DC
  Administered 2016-06-12: 40 mg via SUBCUTANEOUS
  Filled 2016-06-12: qty 0.4

## 2016-06-12 MED ORDER — KETOROLAC TROMETHAMINE 30 MG/ML IJ SOLN: 30.0000 mg | Freq: Four times a day (QID) | INTRAMUSCULAR | Status: DC | PRN

## 2016-06-12 MED ORDER — ONDANSETRON HCL 4 MG PO TABS: 4.0000 mg | ORAL_TABLET | Freq: Four times a day (QID) | ORAL | Status: DC | PRN

## 2016-06-12 MED ORDER — ACETAMINOPHEN 325 MG PO TABS: 650.0000 mg | ORAL_TABLET | Freq: Four times a day (QID) | ORAL | Status: DC | PRN

## 2016-06-12 MED ORDER — IBUPROFEN 600 MG PO TABS
600.0000 mg | ORAL_TABLET | Freq: Four times a day (QID) | ORAL | Status: DC | PRN
  Administered 2016-06-12 (×2): 600 mg via ORAL
  Filled 2016-06-12 (×2): qty 1

## 2016-06-12 MED ORDER — DOCUSATE SODIUM 100 MG PO CAPS
100.0000 mg | ORAL_CAPSULE | Freq: Two times a day (BID) | ORAL | Status: DC
  Filled 2016-06-12 (×2): qty 1

## 2016-06-12 MED ORDER — ACETAMINOPHEN 650 MG RE SUPP
650.0000 mg | Freq: Four times a day (QID) | RECTAL | Status: DC | PRN
Start: 2016-06-12 — End: 2016-06-13

## 2016-06-12 MED ORDER — AMLODIPINE BESYLATE 10 MG PO TABS
10.0000 mg | ORAL_TABLET | Freq: Every day | ORAL | Status: DC
  Administered 2016-06-12 – 2016-06-13 (×2): 10 mg via ORAL
  Filled 2016-06-12 (×2): qty 1

## 2016-06-12 MED ORDER — SODIUM CHLORIDE 0.9 % IV SOLN
INTRAVENOUS | Status: DC
  Administered 2016-06-12 (×3): via INTRAVENOUS

## 2016-06-12 MED ORDER — ONDANSETRON HCL 4 MG/2ML IJ SOLN: 4.0000 mg | Freq: Four times a day (QID) | INTRAMUSCULAR | Status: DC | PRN

## 2016-06-12 NOTE — ED Notes (Signed)
Admitting MD at bedside at this time.

## 2016-06-12 NOTE — ED Notes (Signed)
Pt's heart rate noted to increase on monitor, when this nurse checked on pt, pt w/ empty emesis bag c/o nausea.  Nausea medication offered but pt declined.  Pt given ice chips upon request

## 2016-06-12 NOTE — Progress Notes (Signed)
Admitted this morning because of nausea, diarrhea going on for 3 days. Patient to nausea improved but still has diarrhea. Unable to give stool sample because it is mixed with urine. Patient alert awake oriented denies any nausea but still has diarrhea. Denies any other complaints . Vitals; stable at blood pressure 144/87 Pulse 84. Physical exam showed ;  cardiovascular system S1-S2 regular no murmurs.  Lungs clear to auscultation no wheezes no rales.  J abdomen patient has tenderness in the left lower quadrant. Bowel sounds present. No organomegaly.  Neurologically alert awake oriented, no focal neurological deficit.  Psychiatric mood and affect are within normal limits.  Lab data patient's CBC Chem-7 are within normal limits.  Assessment and plan ;acute gastroenteritis in a 60 year old female. Patient's stool for C. difficile has been pending. I told the patient that if she can give a stool sample if it's negative for C. difficile will discharge the patient home by today evening or early tomorrow morning. Continue IV hydration, IV nausea medicine,  Blood pressures patient has history of essential hypertension controlled with Norvasc. Continue that. Discussed this with patient's registered nurse. .  I spent about 20 minutes. Discussed with the patient.

## 2016-06-13 LAB — HEMOGLOBIN A1C
Hgb A1c MFr Bld: 5.8 % — ABNORMAL HIGH (ref 4.8–5.6)
Mean Plasma Glucose: 120 mg/dL

## 2016-06-13 NOTE — Progress Notes (Signed)
Pt A and O x 4. VSS. Pt tolerating diet well. No complaints of pain or nausea. IV removed intact, no new prescriptions given. Pt voiced understanding of discharge instructions with no further questions. Pt discharged via wheelchair with nurse aide.   

## 2016-06-13 NOTE — Discharge Summary (Signed)
Kim Munoz, is a 60 y.o. female  DOB Jul 05, 1956  MRN [id number].  Admission date:  06/11/2016  Admitting Physician  Arnaldo Natal, MD  Discharge Date:  06/13/2016   Primary MD  [facility]  Patient has seen Dr. Burnadette Pop  on August 21. She can follow with him .  Recommendations for primary care physician for things to follow:  Follow with primary doctor in 1 week   Admission Diagnosis  Nausea vomiting and diarrhea [R11.2, R19.7]   Discharge Diagnosis  Nausea vomiting and diarrhea [R11.2, R19.7]    Active Problems:   Intractable nausea and vomiting      Past Medical History:  Diagnosis Date  . Complication of anesthesia    nausea after colonoscopy  . Family history of adverse reaction to anesthesia    sister - nausea aafter surgery  . Hypertension   . Wears dentures    full upper and lower    Past Surgical History:  Procedure Laterality Date  . ABDOMINAL HYSTERECTOMY    . COLONOSCOPY    . HALLUX VALGUS LAPIDUS Right 04/14/2016   Procedure: HALLUX VALGUS LAPIDUS CORRECTION RIGHT FOOT;  Surgeon: Gwyneth Revels, DPM;  Location: [facility];  Service: Podiatry;  Laterality: Right;  WITH POPLITEAL BLOCK       History of present illness and  Hospital Course:     Kindly see H&P for history of present illness and admission details, please review complete Labs, Consult reports and Test reports for all details in brief  HPI  from the history and physical done on the day of admission 60 year old female patient with the past medical history of essential hypertension admitted because of  vomiting, diarrhea for last 2 days. Patient admitted to hospitalist service for acute gastroenteritis, started on IV hydration, IV nausea medicine.   Hospital Course  #1 nausea vomiting diarrhea likely due  to acute gastroenteritis: Stool for C. difficile has been negative. Patient improved with IV hydration. Patient had Kim dizziness, abdominal pain yesterday but nothing today. No more dizziness. Patient improved with IV hydration, D management. And also diarrhea also improved. Patient is stable for discharge no new medications are added. Advised to continue something softer and easier to digest  for the next 2 days. #2 essential hypertension: Controlled continue amlodipine.    Discharge Condition:stable   Follow UP  Follow-up Information    [facility] Follow up in 1 week(s).   Contact information: [address] [place] [address] [phone]             Discharge Instructions  and  Discharge Medications       Medication List    TAKE these medications   amLODipine 10 MG tablet Commonly known as:  NORVASC Take 10 mg by mouth daily.         Diet and Activity recommendation: See Discharge Instructions above   Consults obtained - none   Major procedures and Radiology Reports - PLEASE review detailed and final reports for all details, in brief -     No results found.  Micro Results    Recent Results (from the past 240 hour(s))  C difficile quick scan w PCR reflex     Status: None   Collection Time: 06/12/16 12:30 PM  Result Value Ref Range Status   C Diff antigen NEGATIVE NEGATIVE Final   C Diff toxin NEGATIVE NEGATIVE Final   C Diff interpretation No C. difficile detected.  Final  Today   Subjective:   Kim Munoz today has no headache,no chest abdominal pain,no new weakness tingling or numbness, feels much better wants to go home today.   Objective:   Blood pressure (!) 153/78, pulse 89, temperature 98.4 F (36.9 C), temperature source Oral, resp. rate 20, height 5\' 1"  (1.549 m), weight 87.3 kg (192 lb 7.4 oz), SpO2 99 %.   Intake/Output Summary (Last 24 hours) at 06/13/16 1113 Last data filed at 06/13/16  1030  Gross per 24 hour  Intake          2619.83 ml  Output                0 ml  Net          2619.83 ml    Exam Awake Alert, Oriented x 3, No new F.N deficits, Normal affect [place].AT,PERRAL Supple Neck,No JVD, No cervical lymphadenopathy appriciated.  Symmetrical Chest wall movement, Good air movement bilaterally, CTAB RRR,No Gallops,Rubs or new Murmurs, No Parasternal Heave +ve B.Sounds, Abd Soft, Non tender, No organomegaly appriciated, No rebound -guarding or rigidity. No Cyanosis, Clubbing or edema, No new Rash or bruise  Data Review   CBC w Diff: Lab Results  Component Value Date   WBC 9.4 06/11/2016   HGB 14.8 06/11/2016   HCT 42.3 06/11/2016   PLT 285 06/11/2016   LYMPHOPCT 25 06/11/2016   MONOPCT 6 06/11/2016   EOSPCT 3 06/11/2016   BASOPCT 1 06/11/2016    CMP: Lab Results  Component Value Date   NA 136 06/11/2016   K 3.8 06/11/2016   CL 104 06/11/2016   CO2 23 06/11/2016   BUN 13 06/11/2016   CREATININE 0.87 06/11/2016   PROT 7.7 06/11/2016   ALBUMIN 3.9 06/11/2016   BILITOT 0.7 06/11/2016   ALKPHOS 35 (L) 06/11/2016   AST 27 06/11/2016   ALT 40 06/11/2016  .   Total Time in preparing paper work, data evaluation and todays exam - 35 minutes  Aleem Elza M.D on 06/13/2016 at 11:13 AM    Note: This dictation was prepared with Dragon dictation along with smaller phrase technology. Any transcriptional errors that result from this process are unintentional.

## 2016-06-13 NOTE — H&P (Signed)
Kim Munoz is an 60 y.o. female.   Chief Complaint: Nausea and vomiting HPI: The patient with past medical history of hypertension presents to the emergency department complaining of nausea and vomiting.  She also admits to diarrhea x3 days.  After multiple doses of antiemetics and normal labs the patient felt that she was too weak to go home which prompted the emergency department staff to call the hospitalist service for admission.  Past Medical History:  Diagnosis Date  . Complication of anesthesia    nausea after colonoscopy  . Family history of adverse reaction to anesthesia    sister - nausea aafter surgery  . Hypertension   . Wears dentures    full upper and lower    Past Surgical History:  Procedure Laterality Date  . ABDOMINAL HYSTERECTOMY    . COLONOSCOPY    . HALLUX VALGUS LAPIDUS Right 04/14/2016   Procedure: HALLUX VALGUS LAPIDUS CORRECTION RIGHT FOOT;  Surgeon: Samara Deist, DPM;  Location: [place];  Service: Podiatry;  Laterality: Right;  WITH POPLITEAL BLOCK    No family history on file. None Social History:  reports that she has been smoking Cigarettes.  She has a 17.50 pack-year smoking history. She has never used smokeless tobacco. She reports that she drinks alcohol. Her drug history is not on file.  Allergies: No Known Allergies  Medications Prior to Admission  Medication Sig Dispense Refill  . amLODipine (NORVASC) 10 MG tablet Take 10 mg by mouth daily.      Results for orders placed or performed during the hospital encounter of 06/11/16 (from the past 48 hour(s))  CBC with Differential     Status: Abnormal   Collection Time: 06/11/16  7:01 PM  Result Value Ref Range   WBC 9.4 3.6 - 11.0 K/uL   RBC 5.15 3.80 - 5.20 MIL/uL   Hemoglobin 14.8 12.0 - 16.0 g/dL   HCT 42.3 35.0 - 47.0 %   MCV 82.1 80.0 - 100.0 fL   MCH 28.7 26.0 - 34.0 pg   MCHC 35.0 32.0 - 36.0 g/dL   RDW 15.3 (H) 11.5 - 14.5 %   Platelets 285 150 - 440 K/uL   Neutrophils  Relative % 65 %   Neutro Abs 6.1 1.4 - 6.5 K/uL   Lymphocytes Relative 25 %   Lymphs Abs 2.3 1.0 - 3.6 K/uL   Monocytes Relative 6 %   Monocytes Absolute 0.6 0.2 - 0.9 K/uL   Eosinophils Relative 3 %   Eosinophils Absolute 0.3 0 - 0.7 K/uL   Basophils Relative 1 %   Basophils Absolute 0.1 0 - 0.1 K/uL  Comprehensive metabolic panel     Status: Abnormal   Collection Time: 06/11/16  7:01 PM  Result Value Ref Range   Sodium 136 135 - 145 mmol/L   Potassium 3.8 3.5 - 5.1 mmol/L   Chloride 104 101 - 111 mmol/L   CO2 23 22 - 32 mmol/L   Glucose, Bld 110 (H) 65 - 99 mg/dL   BUN 13 6 - 20 mg/dL   Creatinine, Ser 0.87 0.44 - 1.00 mg/dL   Calcium 9.6 8.9 - 10.3 mg/dL   Total Protein 7.7 6.5 - 8.1 g/dL   Albumin 3.9 3.5 - 5.0 g/dL   AST 27 15 - 41 U/L   ALT 40 14 - 54 U/L   Alkaline Phosphatase 35 (L) 38 - 126 U/L   Total Bilirubin 0.7 0.3 - 1.2 mg/dL   GFR calc non Af Amer >60 >  60 mL/min   GFR calc Af Amer >60 >60 mL/min    Comment: (NOTE) The eGFR has been calculated using the CKD EPI equation. This calculation has not been validated in all clinical situations. eGFR's persistently <60 mL/min signify possible Chronic Kidney Disease.    Anion gap 9 5 - 15  Lipase, blood     Status: None   Collection Time: 06/11/16  7:01 PM  Result Value Ref Range   Lipase 26 11 - 51 U/L  TSH     Status: Abnormal   Collection Time: 06/11/16  7:01 PM  Result Value Ref Range   TSH 5.184 (H) 0.350 - 4.500 uIU/mL  Urinalysis complete, with microscopic     Status: Abnormal   Collection Time: 06/11/16  8:40 PM  Result Value Ref Range   Color, Urine YELLOW (A) YELLOW   APPearance HAZY (A) CLEAR   Glucose, UA NEGATIVE NEGATIVE mg/dL   Bilirubin Urine NEGATIVE NEGATIVE   Ketones, ur NEGATIVE NEGATIVE mg/dL   Specific Gravity, Urine 1.014 1.005 - 1.030   Hgb urine dipstick NEGATIVE NEGATIVE   pH 5.0 5.0 - 8.0   Protein, ur NEGATIVE NEGATIVE mg/dL   Nitrite NEGATIVE NEGATIVE   Leukocytes, UA  NEGATIVE NEGATIVE   RBC / HPF 0-5 0 - 5 RBC/hpf   WBC, UA 0-5 0 - 5 WBC/hpf   Bacteria, UA RARE (A) NONE SEEN   Squamous Epithelial / LPF 6-30 (A) NONE SEEN   Mucous PRESENT    Hyaline Casts, UA PRESENT   C difficile quick scan w PCR reflex     Status: None   Collection Time: 06/12/16 12:30 PM  Result Value Ref Range   C Diff antigen NEGATIVE NEGATIVE   C Diff toxin NEGATIVE NEGATIVE   C Diff interpretation No C. difficile detected.    No results found.  Review of Systems  Constitutional: Negative for chills and fever.  HENT: Negative for sore throat and tinnitus.   Eyes: Negative for blurred vision and redness.  Respiratory: Negative for cough and shortness of breath.   Cardiovascular: Negative for chest pain, palpitations, orthopnea and PND.  Gastrointestinal: Negative for abdominal pain, diarrhea, nausea and vomiting.  Genitourinary: Negative for dysuria, frequency and urgency.  Musculoskeletal: Negative for joint pain and myalgias.  Skin: Negative for rash.       No lesions  Neurological: Negative for speech change, focal weakness and weakness.  Endo/Heme/Allergies: Does not bruise/bleed easily.       No temperature intolerance  Psychiatric/Behavioral: Negative for depression and suicidal ideas.    Blood pressure 134/79, pulse 85, temperature 98.2 F (36.8 C), temperature source Oral, resp. rate (!) 22, height 5' 1"  (1.549 m), weight 85.4 kg (188 lb 3.2 oz), SpO2 100 %. Physical Exam  Vitals reviewed. Constitutional: She is oriented to person, place, and time. She appears well-developed and well-nourished.  HENT:  Head: Normocephalic and atraumatic.  Mouth/Throat: Oropharynx is clear and moist.  Eyes: Conjunctivae and EOM are normal. Pupils are equal, round, and reactive to light. No scleral icterus.  Neck: Normal range of motion. Neck supple. No JVD present. No tracheal deviation present. No thyromegaly present.  Cardiovascular: Normal rate, regular rhythm and normal  heart sounds.  Exam reveals no gallop and no friction rub.   No murmur heard. Respiratory: Effort normal and breath sounds normal.  GI: Soft. Bowel sounds are normal. She exhibits no distension. There is no tenderness.  Genitourinary:  Genitourinary Comments: Deferred  Lymphadenopathy:    She  has no cervical adenopathy.  Neurological: She is alert and oriented to person, place, and time. No cranial nerve deficit. She exhibits normal muscle tone.  Skin: Skin is warm and dry. No rash noted. No erythema.  Psychiatric: She has a normal mood and affect. Her behavior is normal. Judgment and thought content normal.     Assessment/Plan This is a 60 year old female admitted for intractable nausea and vomiting. 1. Nausea and vomiting: The etiology of the patient describes gastroenteritis. Since being in the emergency department we have not witnessed any diarrhea or emesis. Plan is to hydrate the patient with intravenous fluid and provide antiemetics for symptomatic relief. If she is strong enough by morning (for discharge home. 2. Hypertension: Continue amlodipine 3. DVT prophylaxis: Lovenox 4. GI prophylaxis: None The patient is a full code. Time spent on admission orders and patient care approximately 45 minutes  Harrie Foreman, MD 06/13/2016, 1:07 AM

## 2016-07-15 ENCOUNTER — Other Ambulatory Visit: Payer: Self-pay | Admitting: Podiatry

## 2016-07-15 DIAGNOSIS — M2011 Hallux valgus (acquired), right foot: Secondary | ICD-10-CM

## 2016-07-20 ENCOUNTER — Ambulatory Visit
Admission: RE | Admit: 2016-07-20 | Discharge: 2016-07-20 | Disposition: A | Discharge status: Home / Self Care | Payer: BLUE CROSS/BLUE SHIELD | Source: Ambulatory Visit | Attending: Podiatry | Admitting: Podiatry

## 2016-07-20 DIAGNOSIS — M79671 Pain in right foot: Secondary | ICD-10-CM | POA: Diagnosis present

## 2016-07-20 DIAGNOSIS — M24674 Ankylosis, right foot: Secondary | ICD-10-CM | POA: Insufficient documentation

## 2016-07-20 DIAGNOSIS — M2011 Hallux valgus (acquired), right foot: Secondary | ICD-10-CM | POA: Diagnosis not present

## 2016-08-16 ENCOUNTER — Encounter
Admission: RE | Admit: 2016-08-16 | Discharge: 2016-08-16 | Disposition: A | Discharge status: Home / Self Care | Payer: BLUE CROSS/BLUE SHIELD | Source: Ambulatory Visit | Attending: Podiatry | Admitting: Podiatry

## 2016-08-16 NOTE — Pre-Procedure Instructions (Signed)
ED Provider Notes Date of Service: 06/11/2016 8:55 PM Arnaldo NatalPaul F Malinda, MD  Emergency Medicine    [] Hide copied text [] Hover for attribution information   [facility] Emergency Department Provider Note   ____________________________________________   First MD Initiated Contact with Patient 06/11/16 2008     (approximate)  I have reviewed the triage vital signs and the nursing notes.   HISTORY  Chief Complaint Emesis   HPI Kim Munoz is a 60 y.o. female patient complains of nausea vomiting and diarrhea for 3 days. Patient has no fever. Patient's husband's mother also has diarrhea and lives with them. When also has diarrhea. no one remembers eating any bad food. Patient complains of upper abdominal pain especially in the epigastric area. Patient is unable to keep down any food or water or any other liquid.       Past Medical History:  Diagnosis Date  . Complication of anesthesia    nausea after colonoscopy  . Family history of adverse reaction to anesthesia    sister - nausea aafter surgery  . Hypertension   . Wears dentures    full upper and lower    There are no active problems to display for this patient.        Past Surgical History:  Procedure Laterality Date  . ABDOMINAL HYSTERECTOMY    . COLONOSCOPY    . HALLUX VALGUS LAPIDUS Right 04/14/2016   Procedure: HALLUX VALGUS LAPIDUS CORRECTION RIGHT FOOT;  Surgeon: Gwyneth RevelsJustin Fowler, DPM;  [facility];  Service: Podiatry;  Laterality: Right;  WITH POPLITEAL BLOCK           Prior to Admission medications   Medication Sig Start Date End Date Taking? Authorizing Provider  amLODipine (NORVASC) 10 MG tablet Take 10 mg by mouth daily.    Historical Provider, MD  cyclobenzaprine (FLEXERIL) 10 MG tablet Take 1 tablet (10 mg total) by mouth every 8 (eight) hours as needed for muscle spasms. 12/09/15   Evangeline Dakinharles M Beers, PA-C  naproxen (NAPROSYN) 500 MG  tablet Take 1 tablet (500 mg total) by mouth 2 (two) times daily with a meal. 12/09/15   Evangeline Dakinharles M Beers, PA-C  oxyCODONE-acetaminophen (ROXICET) 5-325 MG tablet Take 1-2 tablets by mouth every 4 (four) hours as needed for severe pain. 12/09/15   Evangeline Dakinharles M Beers, PA-C  oxyCODONE-acetaminophen (ROXICET) 5-325 MG tablet Take 1-2 tablets by mouth every 4 (four) hours as needed for severe pain. 04/14/16   Gwyneth RevelsJustin Fowler, DPM  terbinafine (LAMISIL) 250 MG tablet Take 250 mg by mouth daily.    Historical Provider, MD    Allergies Review of patient's allergies indicates no known allergies.  No family history on file.  Social History        Social History  Substance Use Topics  . Smoking status: Current Every Day Smoker    Packs/day: 0.50    Years: 35.00    Types: Cigarettes  . Smokeless tobacco: Never Used  . Alcohol use Yes      Comment: 2 drinks/mo    Review of Systems Constitutional: No fever/chills Eyes: No visual changes. ENT: No sore throat. Cardiovascular: Denies chest pain. Respiratory: Denies shortness of breath. Gastrointestinal:  See history of present illness Genitourinary: Negative for dysuria. Musculoskeletal: Negative for back pain. Skin: Negative for rash. Neurological: Negative for headaches, focal weakness or numbness.  10-point ROS otherwise negative.  ____________________________________________   PHYSICAL EXAM:  VITAL SIGNS:     ED Triage Vitals  Enc Vitals Group  BP 06/11/16 1900 (!) 135/98     Pulse Rate 06/11/16 1900 95     Resp 06/11/16 1900 16     Temp 06/11/16 1900 98.1 F (36.7 C)     Temp Source 06/11/16 1900 Oral     SpO2 06/11/16 1900 99 %     Weight --      Height --      Head Circumference --      Peak Flow --      Pain Score 06/11/16 1856 8     Pain Loc --      Pain Edu? --      Excl. in GC? --    Constitutional: Alert and oriented. Well appearing and in no acute distress. Eyes: Conjunctivae  are normal. PERRL. EOMI. Head: Atraumatic. Nose: No congestion/rhinnorhea. Mouth/Throat: Mucous membranes are moist.  Oropharynx non-erythematous. Neck: No stridor. Cardiovascular: Normal rate, regular rhythm. Grossly normal heart sounds.  Good peripheral circulation. Respiratory: Normal respiratory effort.  No retractions. Lungs CTAB. Gastrointestinal: Soft and nontender except for an immediate epigastric area.. No distention. No abdominal bruits. No CVA tenderness. Musculoskeletal: No lower extremity tenderness nor edema.  No joint effusions. Neurologic:  Normal speech and language. No gross focal neurologic deficits are appreciated. No gait instability. Skin:  Skin is warm, dry and intact. No rash noted.   ____________________________________________   LABS (all labs ordered are listed, but only abnormal results are displayed)       Labs Reviewed  CBC WITH DIFFERENTIAL/PLATELET - Abnormal; Notable for the following:       Result Value    RDW 15.3 (*)    All other components within normal limits  COMPREHENSIVE METABOLIC PANEL - Abnormal; Notable for the following:    Glucose, Bld 110 (*)    Alkaline Phosphatase 35 (*)    All other components within normal limits  URINALYSIS COMPLETEWITH MICROSCOPIC ([facility] ONLY) - Abnormal; Notable for the following:    Color, Urine YELLOW (*)    APPearance HAZY (*)    Bacteria, UA RARE (*)    Squamous Epithelial / LPF 6-30 (*)    All other components within normal limits  LIPASE, BLOOD   ____________________________________________  EKG  EKG read and interpreted by me shows normal sinus rhythm rate of 93 normal axis no acute ST-T wave changes ____________________________________________  RADIOLOGY   ____________________________________________   PROCEDURES  Procedure(s) performed:   Procedures  Critical Care performed:   ____________________________________________   INITIAL IMPRESSION  / ASSESSMENT AND PLAN / ED COURSE  Pertinent labs & imaging results that were available during my care of the patient were reviewed by me and considered in my medical decision making (see chart for details). After Zofran and Phenergan patient is able to keep fluids down. She is now sleepy. She reports she has abdominal cramps immediately prior to having diarrhea. Otherwise her belly is not hurting anymore.   Clinical Course     ____________________________________________   FINAL CLINICAL IMPRESSION(S) / ED DIAGNOSES  Final diagnoses:  Nausea vomiting and diarrhea      NEW MEDICATIONS STARTED DURING THIS VISIT:     New Prescriptions   No medications on file     Note:  This document was prepared using Dragon voice recognition software and may include unintentional dictation errors.    Arnaldo NatalPaul F Malinda, MD 06/11/16 2239     Electronically signed by Arnaldo NatalPaul F Malinda, MD at 06/11/2016 10:39 PM      ED to Hosp-Admission (Discharged) on 06/11/2016  Detailed Report

## 2016-08-16 NOTE — Patient Instructions (Signed)
  Your procedure is scheduled on: 08-20-16 (FRIDAY) Report to Same Day Surgery 2nd floor medical mall [facility](Medical Mall Entrance-take elevator on left to 2nd floor.  Check in with surgery information desk.) To find out your arrival time please call [phone] between 1PM - 3PM on 08-19-16 [facility](THURSDAY)  Remember: Instructions that are not followed completely may result in serious medical risk, up to and including death, or upon the discretion of your surgeon and anesthesiologist your surgery may need to be rescheduled.    _x___ 1. Do not eat food or drink liquids after midnight. No gum chewing or hard candies.     __x__ 2. No Alcohol for 24 hours before or after surgery.   __x__3. No Smoking for 24 prior to surgery.   ____  4. Bring all medications with you on the day of surgery if instructed.    __x__ 5. Notify your doctor if there is any change in your medical condition     (cold, fever, infections).     Do not wear jewelry, make-up, hairpins, clips or nail polish.  Do not wear lotions, powders, or perfumes. You may wear deodorant.  Do not shave 48 hours prior to surgery. Men may shave face and neck.  Do not bring valuables to the hospital.    [facility] is not responsible for any belongings or valuables.               Contacts, dentures or bridgework may not be worn into surgery.  Leave your suitcase in the car. After surgery it may be brought to your room.  For patients admitted to the hospital, discharge time is determined by your treatment team.   Patients discharged the day of surgery will not be allowed to drive home.  You will need someone to drive you home and stay with you the night of your procedure.    Please read over the following fact sheets that you were given:   [facility] Preparing for Surgery and or MRSA Information   _x___ Take these medicines the morning of surgery with A SIP OF WATER:    1. AMLODIPINE  2.  3.  4.  5.  6.  ____Fleets enema or Magnesium  Citrate as directed.   _x___ Use CHG Soap or sage wipes as directed on instruction sheet   ____ Use inhalers on the day of surgery and bring to hospital day of surgery  ____ Stop metformin 2 days prior to surgery    ____ Take 1/2 of usual insulin dose the night before surgery and none on the morning of  surgery.   ____ Stop Aspirin, Coumadin, Pllavix ,Eliquis, Effient, or Pradaxa  x__ Stop Anti-inflammatories such as Advil, Aleve, Ibuprofen, Motrin, Naproxen,          Naprosyn, Goodies powders or aspirin products. Ok to take Tylenol.   ____ Stop supplements until after surgery.    ____ Bring C-Pap to the hospital.

## 2016-08-18 ENCOUNTER — Encounter
Admission: RE | Admit: 2016-08-18 | Discharge: 2016-08-18 | Disposition: A | Discharge status: Home / Self Care | Payer: BLUE CROSS/BLUE SHIELD | Source: Ambulatory Visit | Attending: Podiatry | Admitting: Podiatry

## 2016-08-18 DIAGNOSIS — M960 Pseudarthrosis after fusion or arthrodesis: Secondary | ICD-10-CM | POA: Diagnosis not present

## 2016-08-18 DIAGNOSIS — I10 Essential (primary) hypertension: Secondary | ICD-10-CM | POA: Diagnosis not present

## 2016-08-18 DIAGNOSIS — Y793 Surgical instruments, materials and orthopedic devices (including sutures) associated with adverse incidents: Secondary | ICD-10-CM | POA: Diagnosis not present

## 2016-08-18 DIAGNOSIS — Z87891 Personal history of nicotine dependence: Secondary | ICD-10-CM | POA: Diagnosis not present

## 2016-08-18 DIAGNOSIS — Y838 Other surgical procedures as the cause of abnormal reaction of the patient, or of later complication, without mention of misadventure at the time of the procedure: Secondary | ICD-10-CM | POA: Diagnosis not present

## 2016-08-18 LAB — POTASSIUM: POTASSIUM: 3.4 mmol/L — AB (ref 3.5–5.1)

## 2016-08-19 MED ORDER — CEFAZOLIN SODIUM-DEXTROSE 2-4 GM/100ML-% IV SOLN
2.0000 g | Freq: Once | INTRAVENOUS | Status: AC
  Administered 2016-08-20: 2 g via INTRAVENOUS

## 2016-08-20 ENCOUNTER — Ambulatory Visit: Payer: BLUE CROSS/BLUE SHIELD | Admitting: Anesthesiology

## 2016-08-20 ENCOUNTER — Encounter: Payer: Self-pay | Admitting: *Deleted

## 2016-08-20 ENCOUNTER — Encounter
Admission: RE | Disposition: A | Discharge status: Home / Self Care | Payer: Self-pay | Source: Ambulatory Visit | Attending: Podiatry

## 2016-08-20 ENCOUNTER — Ambulatory Visit
Admission: RE | Admit: 2016-08-20 | Discharge: 2016-08-20 | Disposition: A | Discharge status: Home / Self Care | Payer: BLUE CROSS/BLUE SHIELD | Source: Ambulatory Visit | Attending: Podiatry | Admitting: Podiatry

## 2016-08-20 DIAGNOSIS — Y793 Surgical instruments, materials and orthopedic devices (including sutures) associated with adverse incidents: Secondary | ICD-10-CM | POA: Insufficient documentation

## 2016-08-20 DIAGNOSIS — M960 Pseudarthrosis after fusion or arthrodesis: Secondary | ICD-10-CM | POA: Diagnosis not present

## 2016-08-20 DIAGNOSIS — I10 Essential (primary) hypertension: Secondary | ICD-10-CM | POA: Insufficient documentation

## 2016-08-20 DIAGNOSIS — Z87891 Personal history of nicotine dependence: Secondary | ICD-10-CM | POA: Insufficient documentation

## 2016-08-20 DIAGNOSIS — Y838 Other surgical procedures as the cause of abnormal reaction of the patient, or of later complication, without mention of misadventure at the time of the procedure: Secondary | ICD-10-CM | POA: Insufficient documentation

## 2016-08-20 DIAGNOSIS — Z419 Encounter for procedure for purposes other than remedying health state, unspecified: Secondary | ICD-10-CM

## 2016-08-20 HISTORY — PX: HALLUX VALGUS LAPIDUS: SHX6626

## 2016-08-20 SURGERY — BUNIONECTOMY, LAPIDUS
Anesthesia: General | Site: Foot | Laterality: Right | Wound class: Clean

## 2016-08-20 MED ORDER — FENTANYL CITRATE (PF) 100 MCG/2ML IJ SOLN
INTRAMUSCULAR | Status: AC
  Filled 2016-08-20: qty 2

## 2016-08-20 MED ORDER — SODIUM CHLORIDE 0.9 % IV SOLN
INTRAVENOUS | Status: DC | PRN
  Administered 2016-08-20: 30 ug/min via INTRAVENOUS

## 2016-08-20 MED ORDER — CEFAZOLIN SODIUM-DEXTROSE 2-4 GM/100ML-% IV SOLN
INTRAVENOUS | Status: AC
  Filled 2016-08-20: qty 100

## 2016-08-20 MED ORDER — LIDOCAINE HCL (PF) 1 % IJ SOLN
INTRAMUSCULAR | Status: AC
  Filled 2016-08-20: qty 30

## 2016-08-20 MED ORDER — LIDOCAINE HCL (CARDIAC) 20 MG/ML IV SOLN
INTRAVENOUS | Status: DC | PRN
  Administered 2016-08-20: 100 mg via INTRAVENOUS

## 2016-08-20 MED ORDER — ROPIVACAINE HCL 5 MG/ML IJ SOLN
INTRAMUSCULAR | Status: DC | PRN
  Administered 2016-08-20: 30 mL via EPIDURAL

## 2016-08-20 MED ORDER — MIDAZOLAM HCL 2 MG/2ML IJ SOLN
INTRAMUSCULAR | Status: DC | PRN
  Administered 2016-08-20: 2 mg via INTRAVENOUS

## 2016-08-20 MED ORDER — NEOMYCIN-POLYMYXIN B GU 40-200000 IR SOLN
Status: DC | PRN
  Administered 2016-08-20: 2 mL

## 2016-08-20 MED ORDER — FAMOTIDINE 20 MG PO TABS
ORAL_TABLET | ORAL | Status: AC
  Administered 2016-08-20: 20 mg via ORAL
  Filled 2016-08-20: qty 1

## 2016-08-20 MED ORDER — BUPIVACAINE HCL (PF) 0.5 % IJ SOLN
INTRAMUSCULAR | Status: AC
  Filled 2016-08-20: qty 30

## 2016-08-20 MED ORDER — MEPERIDINE HCL 25 MG/ML IJ SOLN
12.5000 mg | Freq: Once | INTRAMUSCULAR | Status: AC
  Administered 2016-08-20: 12.5 mg via INTRAVENOUS

## 2016-08-20 MED ORDER — LACTATED RINGERS IV SOLN
INTRAVENOUS | Status: DC
  Administered 2016-08-20 (×3): via INTRAVENOUS

## 2016-08-20 MED ORDER — ONDANSETRON HCL 4 MG/2ML IJ SOLN: 4.0000 mg | Freq: Once | INTRAMUSCULAR | Status: DC | PRN

## 2016-08-20 MED ORDER — LIDOCAINE HCL 1 % IJ SOLN
INTRAMUSCULAR | Status: DC | PRN
  Administered 2016-08-20: 9 mL

## 2016-08-20 MED ORDER — PHENYLEPHRINE HCL 10 MG/ML IJ SOLN
INTRAMUSCULAR | Status: DC | PRN
  Administered 2016-08-20 (×5): 100 ug via INTRAVENOUS

## 2016-08-20 MED ORDER — KETOROLAC TROMETHAMINE 30 MG/ML IJ SOLN
INTRAMUSCULAR | Status: AC
  Administered 2016-08-20: 30 mg via INTRAVENOUS
  Filled 2016-08-20: qty 1

## 2016-08-20 MED ORDER — MEPERIDINE HCL 25 MG/ML IJ SOLN
INTRAMUSCULAR | Status: AC
  Administered 2016-08-20: 12.5 mg via INTRAVENOUS
  Filled 2016-08-20: qty 1

## 2016-08-20 MED ORDER — OXYCODONE-ACETAMINOPHEN 5-325 MG PO TABS
ORAL_TABLET | ORAL | Status: AC
  Filled 2016-08-20: qty 1

## 2016-08-20 MED ORDER — BUPIVACAINE HCL 0.25 % IJ SOLN
INTRAMUSCULAR | Status: DC | PRN
  Administered 2016-08-20: 5 mL

## 2016-08-20 MED ORDER — BUPIVACAINE HCL (PF) 0.25 % IJ SOLN
INTRAMUSCULAR | Status: AC
  Filled 2016-08-20: qty 30

## 2016-08-20 MED ORDER — KETOROLAC TROMETHAMINE 30 MG/ML IJ SOLN
30.0000 mg | Freq: Once | INTRAMUSCULAR | Status: AC
  Administered 2016-08-20: 30 mg via INTRAVENOUS

## 2016-08-20 MED ORDER — ONDANSETRON HCL 4 MG PO TABS: 4.0000 mg | ORAL_TABLET | Freq: Four times a day (QID) | ORAL | Status: DC | PRN

## 2016-08-20 MED ORDER — OXYCODONE-ACETAMINOPHEN 5-325 MG PO TABS
1.0000 | ORAL_TABLET | ORAL | Status: DC | PRN
  Administered 2016-08-20: 1 via ORAL

## 2016-08-20 MED ORDER — ROPIVACAINE HCL 5 MG/ML IJ SOLN
INTRAMUSCULAR | Status: AC
  Filled 2016-08-20: qty 40

## 2016-08-20 MED ORDER — FENTANYL CITRATE (PF) 100 MCG/2ML IJ SOLN
INTRAMUSCULAR | Status: DC | PRN
  Administered 2016-08-20: 100 ug via INTRAVENOUS
  Administered 2016-08-20: 50 ug via INTRAVENOUS

## 2016-08-20 MED ORDER — OXYCODONE-ACETAMINOPHEN 5-325 MG PO TABS: 1.0000 | ORAL_TABLET | ORAL | 0 refills | Status: DC | PRN

## 2016-08-20 MED ORDER — ONDANSETRON HCL 4 MG/2ML IJ SOLN
INTRAMUSCULAR | Status: AC
  Administered 2016-08-20: 4 mg via INTRAVENOUS
  Filled 2016-08-20: qty 2

## 2016-08-20 MED ORDER — PROPOFOL 10 MG/ML IV BOLUS
INTRAVENOUS | Status: DC | PRN
  Administered 2016-08-20: 150 mg via INTRAVENOUS

## 2016-08-20 MED ORDER — ONDANSETRON HCL 4 MG/2ML IJ SOLN
INTRAMUSCULAR | Status: DC | PRN
  Administered 2016-08-20 (×2): 4 mg via INTRAVENOUS

## 2016-08-20 MED ORDER — ONDANSETRON HCL 4 MG/2ML IJ SOLN
4.0000 mg | Freq: Four times a day (QID) | INTRAMUSCULAR | Status: DC | PRN
  Administered 2016-08-20: 4 mg via INTRAVENOUS

## 2016-08-20 MED ORDER — FENTANYL CITRATE (PF) 100 MCG/2ML IJ SOLN
25.0000 ug | INTRAMUSCULAR | Status: DC | PRN
  Administered 2016-08-20 (×5): 25 ug via INTRAVENOUS

## 2016-08-20 MED ORDER — DEXAMETHASONE SODIUM PHOSPHATE 10 MG/ML IJ SOLN
INTRAMUSCULAR | Status: DC | PRN
  Administered 2016-08-20: 4 mg via INTRAVENOUS

## 2016-08-20 MED ORDER — LIDOCAINE HCL (PF) 1 % IJ SOLN
INTRAMUSCULAR | Status: AC
  Filled 2016-08-20: qty 5

## 2016-08-20 MED ORDER — FAMOTIDINE 20 MG PO TABS
20.0000 mg | ORAL_TABLET | Freq: Once | ORAL | Status: AC
  Administered 2016-08-20: 20 mg via ORAL

## 2016-08-20 SURGICAL SUPPLY — 67 items
BAG COUNTER SPONGE EZ (MISCELLANEOUS) IMPLANT
BANDAGE ELASTIC 4 LF NS (GAUZE/BANDAGES/DRESSINGS) ×4 IMPLANT
BANDAGE STRETCH 3X4.1 STRL (GAUZE/BANDAGES/DRESSINGS) ×4 IMPLANT
BASIN GRAD PLASTIC 32OZ STRL (MISCELLANEOUS) ×2 IMPLANT
BIT DRILL 2 FENESTRATED (MISCELLANEOUS) ×1 IMPLANT
BIT DRILL CANNULTD 2.6 X 130MM (MISCELLANEOUS) ×1 IMPLANT
BIT DRILL SOLID 2.0 X 110MM (DRILL) ×1 IMPLANT
BIT DRILLL 2 FENESTRATED (MISCELLANEOUS) ×1
BLADE MED AGGRESSIVE (BLADE) IMPLANT
BLADE SURG 15 STRL LF DISP TIS (BLADE) ×2 IMPLANT
BLADE SURG 15 STRL SS (BLADE) ×2
BNDG ESMARK 4X12 TAN STRL LF (GAUZE/BANDAGES/DRESSINGS) ×2 IMPLANT
BNDG GAUZE 4.5X4.1 6PLY STRL (MISCELLANEOUS) ×2 IMPLANT
BUR 4X45 EGG (BURR) ×2 IMPLANT
CANISTER SUCT 1200ML W/VALVE (MISCELLANEOUS) ×2 IMPLANT
CONTROL 360 (Bone Implant) ×2 IMPLANT
COUNTERSICK 4.0 HEADED (MISCELLANEOUS) ×2
COVER PIN YLW 0.028-062 (MISCELLANEOUS) ×2 IMPLANT
DRAPE FLUOR MINI C-ARM 54X84 (DRAPES) ×2 IMPLANT
DRILL CANNULATED 2.6 X 130MM (MISCELLANEOUS) ×2
DRILL SOLID 2.0 X 110MM (DRILL) ×2
DURAPREP 26ML APPLICATOR (WOUND CARE) ×2 IMPLANT
ELECT REM PT RETURN 9FT ADLT (ELECTROSURGICAL) ×2
ELECTRODE REM PT RTRN 9FT ADLT (ELECTROSURGICAL) ×1 IMPLANT
FENESTRATION DRILL IMPLANT
GAUZE PETRO XEROFOAM 1X8 (MISCELLANEOUS) ×2 IMPLANT
GAUZE SPONGE 4X4 12PLY STRL (GAUZE/BANDAGES/DRESSINGS) ×2 IMPLANT
GAUZE STRETCH 2X75IN STRL (MISCELLANEOUS) ×2 IMPLANT
GLOVE BIO SURGEON STRL SZ7.5 (GLOVE) ×6 IMPLANT
GLOVE INDICATOR 8.0 STRL GRN (GLOVE) ×2 IMPLANT
GOWN STRL REUS W/ TWL LRG LVL3 (GOWN DISPOSABLE) ×3 IMPLANT
GOWN STRL REUS W/TWL LRG LVL3 (GOWN DISPOSABLE) ×3
GRAFT TRIN ELITE MED MUSC TRAN (Graft) ×2 IMPLANT
K-WIRE SNGL END 1.2X150 (MISCELLANEOUS) ×6
KWIRE SNGL END 1.2X150 (MISCELLANEOUS) ×3 IMPLANT
LABEL OR SOLS (LABEL) ×2 IMPLANT
MONOCRYL 5-0 ×4 IMPLANT
NEEDLE FILTER BLUNT 18X 1/2SAF (NEEDLE) ×1
NEEDLE FILTER BLUNT 18X1 1/2 (NEEDLE) ×1 IMPLANT
NEEDLE HYPO 25X1 1.5 SAFETY (NEEDLE) ×4 IMPLANT
NS IRRIG 500ML POUR BTL (IV SOLUTION) ×2 IMPLANT
PACK EXTREMITY ARMC (MISCELLANEOUS) ×2 IMPLANT
PENCIL ELECTRO HAND CTR (MISCELLANEOUS) ×2 IMPLANT
PLATE 2HOLE LOCKING 22MM (Plate) ×2 IMPLANT
PLATE STRAIGHT 4-HOLE (Plate) ×2 IMPLANT
RASP SM TEAR CROSS CUT (RASP) IMPLANT
SCREW  2.7X32 NONLOCK (Screw) ×1 IMPLANT
SCREW 2.7X12 LOCKING (Screw) ×2 IMPLANT
SCREW 2.7X18MM LOCKING (Screw) ×2 IMPLANT
SCREW 2.7X30 NONLOCK (Screw) ×2 IMPLANT
SCREW 2.7X32 NONLOCK (Screw) ×1 IMPLANT
SCREW 3.5X32 NONLOCKING (Screw) ×2 IMPLANT
SCREW 4.0X30MM (Screw) ×1 IMPLANT
SCREW BN ST 30X4XCANN HD (Screw) ×1 IMPLANT
SCREW CANN.SHORT HEAD 4.0X32MM (Screw) ×2 IMPLANT
SCREW COUNTERSINK 4.0 HEADED (MISCELLANEOUS) ×1 IMPLANT
SCREW LOCK PLATE R3 2.7X20 ×8 IMPLANT
SPLINT CAST 1 STEP 4X30 (MISCELLANEOUS) ×4 IMPLANT
SPLINT FAST PLASTER 5X30 (CAST SUPPLIES) ×2
SPLINT PLASTER CAST FAST 5X30 (CAST SUPPLIES) ×2 IMPLANT
STOCKINETTE M/LG 89821 (MISCELLANEOUS) ×2 IMPLANT
STRAP SAFETY BODY (MISCELLANEOUS) ×2 IMPLANT
STRIP CLOSURE SKIN 1/4X4 (GAUZE/BANDAGES/DRESSINGS) ×2 IMPLANT
SUT VIC AB 4-0 FS2 27 (SUTURE) ×2 IMPLANT
SYRINGE 10CC LL (SYRINGE) ×2 IMPLANT
WIRE Z .045 C-WIRE SPADE TIP (WIRE) ×2 IMPLANT
WIRE Z .062 C-WIRE SPADE TIP (WIRE) IMPLANT

## 2016-08-20 NOTE — Anesthesia Postprocedure Evaluation (Signed)
Anesthesia Post Note  Patient: Kim Munoz  Procedure(s) Performed: Procedure(s) (LRB): HALLUX VALGUS LAPIDUS (Right)  Patient location during evaluation: PACU Anesthesia Type: General Level of consciousness: awake and alert Pain management: pain level controlled Vital Signs Assessment: post-procedure vital signs reviewed and stable Respiratory status: spontaneous breathing, nonlabored ventilation, respiratory function stable and patient connected to nasal cannula oxygen Cardiovascular status: blood pressure returned to baseline and stable Postop Assessment: no signs of nausea or vomiting Anesthetic complications: no    Last Vitals:  Vitals:   08/20/16 1207 08/20/16 1210  BP: 107/68   Pulse: (!) 106 (!) 111  Resp: (!) 22 (!) 25  Temp:      Last Pain:  Vitals:   08/20/16 1210  TempSrc:   PainSc: 7                  [name]

## 2016-08-20 NOTE — Op Note (Signed)
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    EBL: Minimal    Anesthesia:local and general    Hemostasis: Midcalf tourniquet inflated to 200 mmHg for approximately 100 minutes    Specimen: Nonunion first metatarsal-cuneiform joint    Complications: None    Operative indications:Kim Munoz is an 60 y.o. that presents today for surgical intervention.  The risks/benefits/alternatives/complications have been discussed and consent has been given.    Procedure:  Patient was brought into the OR and placed on the operating table in thesupine position. After anesthesia was obtained theright lower extremity was prepped and draped in usual sterile fashion.  Attention was directed to the first metatarsocuneiform joint surgical site. Longitudinal incision was made just medial to the long extensor tendon. Sharp and blunt dissection carried down to the periosteum. Subperiosteal dissection was undertaken. The dorsal and medial aspect of the metatarsals cuneiform joint was exposed. The previous locking plates were noted. Initially the dorsal plate was removed. At this time the nonunion site was entered and there was noted fibrotic tissue without bony fusion across the nonunion site. All of the fibrotic nonunion was taken down with the use of curettes, osteotomes, and rongeur.  After removal of the nonunion site initially a 4.0 cannulated screw was placed from dorsal distal to proximal plantar. This was drilled appropriately and placed within the fusion site to stabilize placed the fusion site back in its previous position. The screw was then backed out. The K wire was left intact initially. The medial plate was then removed and the residual nonunion site was taken down with the above  stated instrumentation. At this time the proximal and distal aspect of the nonunion site was then drilled with a 2.0 mm drill bit. A small portion of the nonunion had been sent for pathological examination. A prior to final drilling the site was irrigated copiously. Good bleeding bone was noted. At this time the nonunion site was then packed with 5 cc of Trinity Elite bone graft material. The 4.0 cannulated by 32 mm screw was then placed with good compression noted across the fusion site. Next a 4 hole locking and compression plate was placed dorsally. 2 x 2.7 mm locking screws by 20 mm were placed distally.  3.5 mm x 32 mm nonlocking screw was placed just proximal to the fusion site followed by a 2.7 mm x 20 mm locking screw. Excellent stability was noted at this time. Good alignment was noted on the fluoroscopy. A 2 hole locking plate was placed just medial on the first met cuneiform fusion site. A 2.7 x 18 mm locking screw was placed distally and a 2.7 x 12 mm locking screw was placed proximally. This was noted be extremely stable at this time. The fusion site was packed with the final amounts of bone graft material. Layered closure was then performed with a 3-0 Vicryl for the deeper and subcutaneous tissues and a 5-0 Monocryl undyed for the skin. Patient was then placed in a well compressive sterile dressing and posterior splint. Finally and external Orthofix bone stimulator was fastened overlying the nonunion site.    Patient tolerated the procedure and anesthesia well.  Was transported from the OR to the PACU with all vital signs stable and vascular status intact. To be discharged per routine protocol.  Will follow up in approximately 1 week in the outpatient clinic.

## 2016-08-20 NOTE — H&P (Signed)
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  The various methods of treatment have been discussed with the patient.  No guarantees were given.  After consideration of risks, benefits and other options for treatment, the patient has consented to surgery.  I have reviewed the patients' chart and labs.    Patient Vitals for the past 24 hrs:  BP Temp Temp src Pulse Resp SpO2 Height Weight  08/20/16 0610 (!) 142/96 (!) 96.7 F (35.9 C) Tympanic 85 16 99 % 5\' 1"  (1.549 m) 88.5 kg (195 lb)    Munoz history and physical examination was performed in my office.  The patient was reexamined.  There have been no changes to this history and physical examination.  Kim Munoz, Kim Munoz

## 2016-08-20 NOTE — Addendum Note (Signed)
Addendum  created 08/20/16 1307 by Junious SilkMark Bridie Colquhoun, CRNA   Anesthesia Intra Meds edited

## 2016-08-20 NOTE — Anesthesia Procedure Notes (Signed)
Procedure Name: LMA Insertion Date/Time: 08/20/2016 7:45 AM Performed by: Junious SilkNOLES, Terisha Losasso Pre-anesthesia Checklist: Patient identified, Patient being monitored, Timeout performed, Emergency Drugs available and Suction available Patient Re-evaluated:Patient Re-evaluated prior to inductionOxygen Delivery Method: Circle system utilized Preoxygenation: Pre-oxygenation with 100% oxygen Intubation Type: IV induction Ventilation: Mask ventilation without difficulty LMA: LMA inserted LMA Size: 3.5 Tube type: Oral Number of attempts: 1 Placement Confirmation: positive ETCO2 and breath sounds checked- equal and bilateral Tube secured with: Tape Dental Injury: Teeth and Oropharynx as per pre-operative assessment

## 2016-08-20 NOTE — Anesthesia Preprocedure Evaluation (Signed)
Anesthesia Evaluation  Patient identified by MRN, date of birth, ID band Patient awake    Reviewed: Allergy & Precautions, H&P , NPO status , Patient's Chart, lab work & pertinent test results, reviewed documented beta blocker date and time   History of Anesthesia Complications (+) Family history of anesthesia reaction and history of anesthetic complications  Airway Mallampati: II  TM Distance: >3 FB Neck ROM: full    Dental  (+) Teeth Intact   Pulmonary neg pulmonary ROS, former smoker,    Pulmonary exam normal        Cardiovascular Exercise Tolerance: Good hypertension, negative cardio ROS Normal cardiovascular exam Rate:Normal     Neuro/Psych negative neurological ROS  negative psych ROS   GI/Hepatic negative GI ROS, Neg liver ROS,   Endo/Other  negative endocrine ROS  Renal/GU negative Renal ROS  negative genitourinary   Musculoskeletal   Abdominal   Peds  Hematology negative hematology ROS (+)   Anesthesia Other Findings   Reproductive/Obstetrics negative OB ROS                             Anesthesia Physical Anesthesia Plan  ASA: III  Anesthesia Plan: General LMA   Post-op Pain Management:    Induction:   Airway Management Planned:   Additional Equipment:   Intra-op Plan:   Post-operative Plan:   Informed Consent: I have reviewed the patients History and Physical, chart, labs and discussed the procedure including the risks, benefits and alternatives for the proposed anesthesia with the patient or authorized representative who has indicated his/her understanding and acceptance.     Plan Discussed with: CRNA  Anesthesia Plan Comments:         Anesthesia Quick Evaluation

## 2016-08-20 NOTE — Discharge Instructions (Signed)
AMBULATORY SURGERY  DISCHARGE INSTRUCTIONS   1) The drugs that you were given will stay in your system until tomorrow so for the next 24 hours you should not:  A) Drive an automobile B) Make any legal decisions C) Drink any alcoholic beverage   2) You may resume regular meals tomorrow.  Today it is better to start with liquids and gradually work up to solid foods.  You may eat anything you prefer, but it is better to start with liquids, then soup and crackers, and gradually work up to solid foods.   3) Please notify your doctor immediately if you have any unusual bleeding, trouble breathing, redness and pain at the surgery site, drainage, fever, or pain not relieved by medication.    4) Additional Instructions:        Please contact your physician with any problems or Same Day Surgery at [phone], Monday through Friday 6 am to 4 pm, or  at [facility] Main number at [phone].[facility]  POST OPERATIVE INSTRUCTIONS FOR DR. TROXLER AND DR. [facility]   1. Take your medication as prescribed.  Pain medication should be taken only as needed.  2. Keep the dressing clean, dry and intact.  3. Keep your foot elevated above the heart level for the first 48 hours.  4. Walking to the bathroom and brief periods of walking are acceptable, unless we have instructed you to be non-weight bearing.  5. Always wear your post-op shoe when walking.  Always use your crutches if you are to be non-weight bearing.  6. Do not take a shower. Baths are permissible as long as the foot is kept out of the water.   7. Every hour you are awake:  - Bend your knee 15 times.  8. Call [facility] [phone]) if any of the following problems occur: - You develop a temperature or fever. - The bandage becomes saturated with blood. - Medication does not stop your pain. - Injury of the foot occurs. - Any  symptoms of infection including redness, odor, or red streaks running from wound.

## 2016-08-20 NOTE — Transfer of Care (Signed)
Immediate Anesthesia Transfer of Care Note  Patient: Kim Munoz  Procedure(s) Performed: Procedure(s): HALLUX VALGUS LAPIDUS (Right)  Patient Location: PACU  Anesthesia Type:General  Level of Consciousness: sedated  Airway & Oxygen Therapy: Patient Spontanous Breathing and Patient connected to face mask oxygen  Post-op Assessment: Report given to RN and Post -op Vital signs reviewed and stable  Post vital signs: Reviewed and stable  Last Vitals:  Vitals:   08/20/16 0610  BP: (!) 142/96  Pulse: 85  Resp: 16  Temp: (!) 35.9 C    Last Pain:  Vitals:   08/20/16 0610  TempSrc: Tympanic  PainSc: 5       Patients Stated Pain Goal: 2 (08/20/16 0610)  Complications: No apparent anesthesia complications

## 2016-08-23 ENCOUNTER — Encounter: Payer: Self-pay | Admitting: Podiatry

## 2016-08-23 LAB — SURGICAL PATHOLOGY

## 2016-08-24 ENCOUNTER — Encounter: Payer: Self-pay | Admitting: Podiatry

## 2016-09-22 ENCOUNTER — Encounter: Payer: Self-pay | Admitting: Podiatry

## 2016-09-23 ENCOUNTER — Encounter: Payer: Self-pay | Admitting: Podiatry

## 2016-10-05 ENCOUNTER — Other Ambulatory Visit: Payer: Self-pay | Admitting: Podiatry

## 2016-10-05 DIAGNOSIS — M9689 Other intraoperative and postprocedural complications and disorders of the musculoskeletal system: Secondary | ICD-10-CM

## 2016-11-01 ENCOUNTER — Ambulatory Visit
Admission: RE | Admit: 2016-11-01 | Discharge: 2016-11-01 | Disposition: A | Discharge status: Home / Self Care | Payer: 59 | Source: Ambulatory Visit | Attending: Podiatry | Admitting: Podiatry

## 2016-11-01 DIAGNOSIS — M9689 Other intraoperative and postprocedural complications and disorders of the musculoskeletal system: Secondary | ICD-10-CM | POA: Insufficient documentation

## 2016-11-01 DIAGNOSIS — M24674 Ankylosis, right foot: Secondary | ICD-10-CM | POA: Insufficient documentation

## 2017-01-09 ENCOUNTER — Emergency Department
Admission: EM | Admit: 2017-01-09 | Discharge: 2017-01-09 | Disposition: A | Discharge status: Home / Self Care | Payer: BLUE CROSS/BLUE SHIELD | Attending: Emergency Medicine | Admitting: Emergency Medicine

## 2017-01-09 ENCOUNTER — Emergency Department: Payer: BLUE CROSS/BLUE SHIELD

## 2017-01-09 ENCOUNTER — Encounter: Payer: Self-pay | Admitting: Emergency Medicine

## 2017-01-09 DIAGNOSIS — I10 Essential (primary) hypertension: Secondary | ICD-10-CM | POA: Insufficient documentation

## 2017-01-09 DIAGNOSIS — M25512 Pain in left shoulder: Secondary | ICD-10-CM

## 2017-01-09 DIAGNOSIS — Z79899 Other long term (current) drug therapy: Secondary | ICD-10-CM | POA: Insufficient documentation

## 2017-01-09 DIAGNOSIS — Z87891 Personal history of nicotine dependence: Secondary | ICD-10-CM | POA: Insufficient documentation

## 2017-01-09 DIAGNOSIS — M7552 Bursitis of left shoulder: Secondary | ICD-10-CM

## 2017-01-09 MED ORDER — OXYCODONE-ACETAMINOPHEN 5-325 MG PO TABS: 1.0000 | ORAL_TABLET | ORAL | 0 refills | Status: DC | PRN

## 2017-01-09 MED ORDER — KETOROLAC TROMETHAMINE 30 MG/ML IJ SOLN
60.0000 mg | Freq: Once | INTRAMUSCULAR | Status: AC
  Administered 2017-01-09: 60 mg via INTRAMUSCULAR
  Filled 2017-01-09: qty 2

## 2017-01-09 MED ORDER — OXYCODONE-ACETAMINOPHEN 5-325 MG PO TABS
1.0000 | ORAL_TABLET | Freq: Once | ORAL | Status: AC
  Administered 2017-01-09: 1 via ORAL
  Filled 2017-01-09: qty 1

## 2017-01-09 MED ORDER — METHYLPREDNISOLONE 4 MG PO TBPK: ORAL_TABLET | ORAL | 0 refills | Status: DC

## 2017-01-09 MED ORDER — IBUPROFEN 800 MG PO TABS: 800.0000 mg | ORAL_TABLET | Freq: Three times a day (TID) | ORAL | 0 refills | Status: DC | PRN

## 2017-01-09 NOTE — ED Triage Notes (Signed)
Patient reports left shoulder pain that radiates down left arm since 2 am.  Denies any type of injury.

## 2017-01-09 NOTE — Discharge Instructions (Signed)
1. You may take pain medicines as needed (Motrin/Percocet #15). 2. Take steroid taper as prescribed (Medrol Dosepak). 3. Wear sling as needed for comfort. 4. Return to the ER for worsening symptoms, persistent vomiting, difficulty breathing or other concerns.

## 2017-01-09 NOTE — ED Provider Notes (Signed)
[facility] Emergency Department Provider Note   ____________________________________________   First MD Initiated Contact with Patient 01/09/17 [phone]     (approximate)  I have reviewed the triage vital signs and the nursing notes.   HISTORY  Chief Complaint Shoulder Pain    HPI Lennis Rader is a 61 y.o. female who presents to the ED from home with a chief complaint of left shoulder pain. Patient awoke at 2 AM with left shoulder pain radiating down her left arm. Denies fall/injury/trauma. Symptoms are worsened with movement. Patient is right-hand dominant. May have strained left shoulder the other day. Denies associated fever, chills, chest pain, shortness of breath, abdominal pain, nausea, vomiting. Denies recent travel or trauma.   Past Medical History:  Diagnosis Date  . Complication of anesthesia    nausea after colonoscopy  . Family history of adverse reaction to anesthesia    sister - nausea aafter surgery  . Hypertension   . Wears dentures    full upper and lower    Patient Active Problem List   Diagnosis Date Noted  . Intractable nausea and vomiting 06/12/2016    Past Surgical History:  Procedure Laterality Date  . ABDOMINAL HYSTERECTOMY    . COLONOSCOPY    . HALLUX VALGUS LAPIDUS Right 04/14/2016   Procedure: HALLUX VALGUS LAPIDUS CORRECTION RIGHT FOOT;  Surgeon: Gwyneth Revels, DPM;  Location: [facility];  Service: Podiatry;  Laterality: Right;  WITH POPLITEAL BLOCK  . HALLUX VALGUS LAPIDUS Right 08/20/2016   Procedure: HALLUX VALGUS LAPIDUS;  Surgeon: Gwyneth Revels, DPM;  Location: [facility];  Service: Podiatry;  Laterality: Right;    Prior to Admission medications   Medication Sig Start Date End Date Taking? Authorizing Provider  amLODipine (NORVASC) 10 MG tablet Take 10 mg by mouth every morning.     Historical Provider, MD  hydrochlorothiazide (HYDRODIURIL) 12.5 MG tablet Take 12.5 mg by mouth every morning.  04/09/16  04/09/17  Historical Provider, MD  ibuprofen (ADVIL,MOTRIN) 800 MG tablet Take 1 tablet (800 mg total) by mouth every 8 (eight) hours as needed for moderate pain. 01/09/17   Irean Hong, MD  methylPREDNISolone (MEDROL DOSEPAK) 4 MG TBPK tablet Take as directed 01/09/17   Irean Hong, MD  oxyCODONE-acetaminophen (ROXICET) 5-325 MG tablet Take 1 tablet by mouth every 4 (four) hours as needed for severe pain. 01/09/17   Irean Hong, MD    Allergies Patient has no known allergies.  No family history on file.  Social History Social History  Substance Use Topics  . Smoking status: Former Smoker    Packs/day: 0.50    Years: 35.00    Types: Cigarettes    Quit date: 04/10/2016  . Smokeless tobacco: Never Used  . Alcohol use Yes     Comment: 2 drinks/mo    Review of Systems  Constitutional: No fever/chills. Eyes: No visual changes. ENT: No sore throat. Cardiovascular: Denies chest pain. Respiratory: Denies shortness of breath. Gastrointestinal: No abdominal pain.  No nausea, no vomiting.  No diarrhea.  No constipation. Genitourinary: Negative for dysuria. Musculoskeletal: Positive for left shoulder pain. Negative for back pain. Skin: Negative for rash. Neurological: Negative for headaches, focal weakness or numbness.  10-point ROS otherwise negative.  ____________________________________________   PHYSICAL EXAM:  VITAL SIGNS: ED Triage Vitals  Enc Vitals Group     BP 01/09/17 0545 (!) 187/108     Pulse Rate 01/09/17 0545 83     Resp 01/09/17 0545 (!) 22  Temp 01/09/17 0545 97.8 F (36.6 C)     Temp Source 01/09/17 0545 Oral     SpO2 01/09/17 0545 100 %     Weight 01/09/17 0543 187 lb (84.8 kg)     Height 01/09/17 0543  (1.549 m)     Head Circumference --      Peak Flow --      Pain Score 01/09/17 0543 10     Pain Loc --      Pain Edu? --      Excl. in GC? --     Constitutional: Alert and oriented. Well appearing and in mild acute distress. Eyes: Conjunctivae  are normal. PERRL. EOMI. Head: Atraumatic. Nose: No congestion/rhinnorhea. Mouth/Throat: Mucous membranes are moist.  Oropharynx non-erythematous. Neck: No stridor.  No cervical spine tenderness to palpation.  No carotid bruits. Cardiovascular: Normal rate, regular rhythm. Grossly normal heart sounds.  Good peripheral circulation. Respiratory: Normal respiratory effort.  No retractions. Lungs CTAB. Gastrointestinal: Soft and nontender. No distention. No abdominal bruits. No CVA tenderness. Musculoskeletal: Left anterior shoulder tender to palpation. Limited range of motion secondary to pain. 2+ distal pulse. Brisk, less than 5 second capillary refill. Neurologic:  Normal speech and language. No gross focal neurologic deficits are appreciated. No gait instability. Skin:  Skin is warm, dry and intact. No rash noted. Psychiatric: Mood and affect are normal. Speech and behavior are normal.  ____________________________________________   LABS (all labs ordered are listed, but only abnormal results are displayed)  Labs Reviewed - No data to display ____________________________________________  EKG  None ____________________________________________  RADIOLOGY  Shoulder x-rays (viewed by me, interpreted per Dr. Phill Myron): 1. No acute fracture or dislocation.  2. Small focal periarticular calcification, suggesting underlying  calcific tendinopathy.  3. Degenerative osteoarthritic changes about the left glenohumeral  and acromioclavicular joints.    ____________________________________________   PROCEDURES  Procedure(s) performed: None  Procedures  Critical Care performed: No  ____________________________________________   INITIAL IMPRESSION / ASSESSMENT AND PLAN / ED COURSE  Pertinent labs & imaging results that were available during my care of the patient were reviewed by me and considered in my medical decision making (see chart for details).  61 year old female who  presents with left shoulder bursitis. Will treat with NSAIDs, analgesia, Medrol Dosepak, sling for comfort and follow-up with orthopedics next week. Strict return precautions given. Patient verbalizes understanding and agrees with plan of care.      ____________________________________________   FINAL CLINICAL IMPRESSION(S) / ED DIAGNOSES  Final diagnoses:  Acute pain of left shoulder  Bursitis of left shoulder      NEW MEDICATIONS STARTED DURING THIS VISIT:  New Prescriptions   IBUPROFEN (ADVIL,MOTRIN) 800 MG TABLET    Take 1 tablet (800 mg total) by mouth every 8 (eight) hours as needed for moderate pain.   METHYLPREDNISOLONE (MEDROL DOSEPAK) 4 MG TBPK TABLET    Take as directed   OXYCODONE-ACETAMINOPHEN (ROXICET) 5-325 MG TABLET    Take 1 tablet by mouth every 4 (four) hours as needed for severe pain.     Note:  This document was prepared using Dragon voice recognition software and may include unintentional dictation errors.    Irean Hong, MD 01/09/17 [id number]

## 2017-01-09 NOTE — ED Notes (Signed)

## 2017-09-25 ENCOUNTER — Other Ambulatory Visit: Payer: Self-pay

## 2017-09-25 ENCOUNTER — Emergency Department
Admission: EM | Admit: 2017-09-25 | Discharge: 2017-09-25 | Disposition: A | Discharge status: Home / Self Care | Payer: BLUE CROSS/BLUE SHIELD | Attending: Emergency Medicine | Admitting: Emergency Medicine

## 2017-09-25 DIAGNOSIS — M19011 Primary osteoarthritis, right shoulder: Secondary | ICD-10-CM | POA: Insufficient documentation

## 2017-09-25 DIAGNOSIS — Z87891 Personal history of nicotine dependence: Secondary | ICD-10-CM | POA: Insufficient documentation

## 2017-09-25 DIAGNOSIS — Z79899 Other long term (current) drug therapy: Secondary | ICD-10-CM | POA: Insufficient documentation

## 2017-09-25 DIAGNOSIS — I10 Essential (primary) hypertension: Secondary | ICD-10-CM | POA: Insufficient documentation

## 2017-09-25 DIAGNOSIS — M7532 Calcific tendinitis of left shoulder: Secondary | ICD-10-CM | POA: Insufficient documentation

## 2017-09-25 DIAGNOSIS — M19012 Primary osteoarthritis, left shoulder: Secondary | ICD-10-CM | POA: Insufficient documentation

## 2017-09-25 MED ORDER — DICLOFENAC SODIUM 50 MG PO TBEC: 50.0000 mg | DELAYED_RELEASE_TABLET | Freq: Two times a day (BID) | ORAL | 1 refills | Status: AC

## 2017-09-25 MED ORDER — KETOROLAC TROMETHAMINE 10 MG PO TABS: 10.0000 mg | ORAL_TABLET | Freq: Three times a day (TID) | ORAL | 0 refills | Status: DC

## 2017-09-25 MED ORDER — CYCLOBENZAPRINE HCL 10 MG PO TABS
10.0000 mg | ORAL_TABLET | Freq: Once | ORAL | Status: AC
  Administered 2017-09-25: 10 mg via ORAL
  Filled 2017-09-25: qty 1

## 2017-09-25 MED ORDER — KETOROLAC TROMETHAMINE 30 MG/ML IJ SOLN
30.0000 mg | Freq: Once | INTRAMUSCULAR | Status: AC
  Administered 2017-09-25: 30 mg via INTRAMUSCULAR
  Filled 2017-09-25: qty 1

## 2017-09-25 MED ORDER — CYCLOBENZAPRINE HCL 5 MG PO TABS: 5.0000 mg | ORAL_TABLET | Freq: Three times a day (TID) | ORAL | 0 refills | Status: DC | PRN

## 2017-09-25 NOTE — Discharge Instructions (Signed)
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 You should follow-up with ortho for ongoing management. Take the prescription meds as directed.

## 2017-09-25 NOTE — ED Triage Notes (Signed)
Pt reports bilateral shoulder pain going across clavicle. 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

## 2017-09-25 NOTE — ED Provider Notes (Signed)
[facility] Emergency Department Provider Note ____________________________________________  Time seen: 1914  I have reviewed the triage vital signs and the nursing notes.  HISTORY  Chief Complaint  Shoulder Pain  HPI Kim Munoz is a 62 y.o. female presented herself to the ED for bilateral shoulder pain.  Patient describes pain across the clavicles bilaterally.  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  And this pain for her has been chronic in nature.  She denies any recent injuries, accident, trauma, or falls.  She denies at this time being evaluated by orthopedics.  She does admit during the interview however that she is being referred to [facility] by her primary care provider.  She denies any current medications, and she denies any chest pain or shortness of breath.  She describes pain and disability to the left greater than right at this time and notes that she has increased pain with any attempts to raise the elbow above shoulder level.  The  Past Medical History:  Diagnosis Date  . Complication of anesthesia    nausea after colonoscopy  . Family history of adverse reaction to anesthesia    sister - nausea aafter surgery  . Hypertension   . Wears dentures    full upper and lower    Patient Active Problem List   Diagnosis Date Noted  . Intractable nausea and vomiting 06/12/2016    Past Surgical History:  Procedure Laterality Date  . ABDOMINAL HYSTERECTOMY    . COLONOSCOPY    . HALLUX VALGUS LAPIDUS Right 04/14/2016   Procedure: HALLUX VALGUS LAPIDUS CORRECTION RIGHT FOOT;  Surgeon: Gwyneth Revels, DPM;  Location: [facility];  Service: Podiatry;  Laterality: Right;  WITH POPLITEAL BLOCK  . HALLUX VALGUS LAPIDUS Right 08/20/2016   Procedure: HALLUX VALGUS LAPIDUS;  Surgeon: Gwyneth Revels, DPM;  Location: [facility];  Service: Podiatry;  Laterality: Right;    Prior to Admission medications    Medication Sig Start Date End Date Taking? Authorizing Provider  amLODipine (NORVASC) 10 MG tablet Take 10 mg by mouth every morning.     [provider]  cyclobenzaprine (FLEXERIL) 5 MG tablet Take 1 tablet (5 mg total) by mouth 3 (three) times daily as needed for muscle spasms. 09/25/17   Kohl Polinsky, Charlesetta Ivory, PA-C  diclofenac (VOLTAREN) 50 MG EC tablet Take 1 tablet (50 mg total) by mouth 2 (two) times daily. 09/25/17 11/24/17  Ashawn Rinehart, Charlesetta Ivory, PA-C  hydrochlorothiazide (HYDRODIURIL) 12.5 MG tablet Take 12.5 mg by mouth every morning.  04/09/16 04/09/17  [provider]  ibuprofen (ADVIL,MOTRIN) 800 MG tablet Take 1 tablet (800 mg total) by mouth every 8 (eight) hours as needed for moderate pain. 01/09/17   Irean Hong, MD  ketorolac (TORADOL) 10 MG tablet Take 1 tablet (10 mg total) by mouth every 8 (eight) hours. 09/25/17   Wahneta Derocher, Charlesetta Ivory, PA-C  methylPREDNISolone (MEDROL DOSEPAK) 4 MG TBPK tablet Take as directed 01/09/17   Irean Hong, MD  oxyCODONE-acetaminophen (ROXICET) 5-325 MG tablet Take 1 tablet by mouth every 4 (four) hours as needed for severe pain. 01/09/17   Irean Hong, MD    Allergies Patient has no known allergies.  No family history on file.  Social History Social History   Tobacco Use  . Smoking status: Former Smoker    Packs/day: 0.50    Years: 35.00    Pack years: 17.50    Types: Cigarettes  Last attempt to quit: 04/10/2016    Years since quitting: 1.4  . Smokeless tobacco: Never Used  Substance Use Topics  . Alcohol use: Yes    Comment: 2 drinks/mo  . Drug use: Not on file    Review of Systems  Constitutional: Negative for fever. Cardiovascular: Negative for chest pain. Respiratory: Negative for shortness of breath. Musculoskeletal: Negative for back pain. Left shoulder pain and disability.  Skin: Negative for rash. Neurological: Negative for headaches, focal weakness or  numbness. ____________________________________________  PHYSICAL EXAM:  VITAL SIGNS: ED Triage Vitals  Enc Vitals Group     BP 09/25/17 1815 (!) 168/100     Pulse Rate 09/25/17 1815 86     Resp 09/25/17 1815 16     Temp 09/25/17 1815 98.8 F (37.1 C)     Temp Source 09/25/17 1815 Oral     SpO2 09/25/17 1815 100 %     Weight 09/25/17 1816 190 lb (86.2 kg)     Height 09/25/17 1816 5\' 1"  (1.549 m)     Head Circumference --      Peak Flow --      Pain Score --      Pain Loc --      Pain Edu? --      Excl. in GC? --     Constitutional: Alert and oriented. Well appearing and in no distress. Head: Normocephalic and atraumatic. Neck: Supple. No thyromegaly. Cardiovascular: Normal rate, regular rhythm. Normal distal pulses. Respiratory: Normal respiratory effort. No wheezes/rales/rhonchi. Gastrointestinal: Soft and nontender. No distention. Musculoskeletal: Patient with the left arm held in a flexed abducted position.  She has self limited range of motion on the left side.  Normal composite grip strength bilaterally.  Patient has active range of motion to about 90 degrees of extension and abduction.  Nontender with normal range of motion in all extremities.  Neurologic: Nerves II through XII grossly intact.  Normal gross sensation. Normal speech and language. No gross focal neurologic deficits are appreciated. Skin:  Skin is warm, dry and intact. No rash noted. Psychiatric: Mood and affect are normal. Patient exhibits appropriate insight and judgment. ____________________________________________   RADIOLOGY  Left Shoulder (01/09/2017)  IMPRESSION: 1. No acute fracture or dislocation. 2. Small focal periarticular calcification, suggesting underlying calcific tendinopathy. 3. Degenerative osteoarthritic changes about the left glenohumeral and acromioclavicular joints.  Right Shoulder ([facility] via [facility] Everywhere 05/20/2017)  FINDINGS:  There is no fracture, dislocation, lytic or  blastic lesion, chondrocalcinosis or evidence for inflammatory or erosive arthropathy.  There is mild osseous overgrowth at the acromioclavicular joint. ____________________________________________  PROCEDURES  Procedures Toradol 30 mg IM Flexeril 10 mg PO ____________________________________________  INITIAL IMPRESSION / ASSESSMENT AND PLAN / ED COURSE  Patient with ED evaluation of chronic right greater than left shoulder pain and disability.  Patient apparently was evaluated here and April for the same complaint.  [facility] some mild calcific tendinitis of the left shoulder.  She was referred at that time to orthopedics, but the patient relates to me at this time that she was unaware she had a referral and did not apparently read the paperwork she was given.  She was discharged at that time with prescription prednisone, pain medicine, and ibuprofen.  She apparently has been seen in the interim by her primary care provider and again evaluated for right shoulder pain including a CT which I am unable to to retrieve at this time.  Patient  is aware of her clinical diagnosis, and apparently has been referred to orthopedics by her primary care provider.  She denies any current medications at this time and denies any benefit with over-the-counter Tylenol.  She will be discharged with prescriptions for ketorolac and Flexeril dose as directed.  She will also start a daily dose of diclofenac once the ketorolac has been used for acute pain relief.  She is again referred to orthopedics at this time for further evaluation and management.  Patient understands her plan of care at this time.  Work note is provided for 3 days as requested. ____________________________________________  FINAL CLINICAL IMPRESSION(S) / ED DIAGNOSES  Final diagnoses:  Arthritis of both acromioclavicular joints  Calcific tendinitis of left shoulder      Yanelie Abraha, Charlesetta Ivory,  PA-C 09/25/17 2355    Dionne Bucy, MD 09/26/17 1113

## 2020-03-23 ENCOUNTER — Other Ambulatory Visit: Payer: Self-pay

## 2020-03-23 DIAGNOSIS — J449 Chronic obstructive pulmonary disease, unspecified: Secondary | ICD-10-CM | POA: Insufficient documentation

## 2020-03-23 DIAGNOSIS — Z79899 Other long term (current) drug therapy: Secondary | ICD-10-CM | POA: Insufficient documentation

## 2020-03-23 DIAGNOSIS — R05 Cough: Secondary | ICD-10-CM | POA: Diagnosis present

## 2020-03-23 DIAGNOSIS — R0989 Other specified symptoms and signs involving the circulatory and respiratory systems: Secondary | ICD-10-CM | POA: Diagnosis not present

## 2020-03-23 DIAGNOSIS — J069 Acute upper respiratory infection, unspecified: Secondary | ICD-10-CM | POA: Insufficient documentation

## 2020-03-23 DIAGNOSIS — R509 Fever, unspecified: Secondary | ICD-10-CM | POA: Diagnosis not present

## 2020-03-23 DIAGNOSIS — Z87891 Personal history of nicotine dependence: Secondary | ICD-10-CM | POA: Diagnosis not present

## 2020-03-23 DIAGNOSIS — Z7951 Long term (current) use of inhaled steroids: Secondary | ICD-10-CM | POA: Diagnosis not present

## 2020-03-23 DIAGNOSIS — I10 Essential (primary) hypertension: Secondary | ICD-10-CM | POA: Diagnosis not present

## 2020-03-23 DIAGNOSIS — Z20822 Contact with and (suspected) exposure to covid-19: Secondary | ICD-10-CM | POA: Insufficient documentation

## 2020-03-23 MED ORDER — ACETAMINOPHEN 325 MG PO TABS
650.0000 mg | ORAL_TABLET | Freq: Once | ORAL | Status: AC
  Administered 2020-03-24: 650 mg via ORAL
  Filled 2020-03-23: qty 2

## 2020-03-23 NOTE — ED Notes (Signed)
Patient reports history of high blood pressure, but has not been taking meds for same due to not being able to afford them for the past 2 years.

## 2020-03-23 NOTE — ED Notes (Signed)
Pt walks up to nursing station asking about wait times. Pt c/o worsening headache. Tylenol offered.

## 2020-03-23 NOTE — ED Triage Notes (Signed)
Patient reports cough and congestion that started yesterday.

## 2020-03-24 ENCOUNTER — Encounter: Payer: Self-pay | Admitting: Radiology

## 2020-03-24 ENCOUNTER — Emergency Department
Admission: EM | Admit: 2020-03-24 | Discharge: 2020-03-24 | Disposition: A | Discharge status: Home / Self Care | Payer: BC Managed Care – PPO | Attending: Emergency Medicine | Admitting: Emergency Medicine

## 2020-03-24 ENCOUNTER — Emergency Department: Payer: BC Managed Care – PPO

## 2020-03-24 DIAGNOSIS — J069 Acute upper respiratory infection, unspecified: Secondary | ICD-10-CM

## 2020-03-24 DIAGNOSIS — R059 Cough, unspecified: Secondary | ICD-10-CM

## 2020-03-24 LAB — POC SARS CORONAVIRUS 2 AG: SARS Coronavirus 2 Ag: NEGATIVE

## 2020-03-24 MED ORDER — ALBUTEROL SULFATE HFA 108 (90 BASE) MCG/ACT IN AERS: INHALATION_SPRAY | RESPIRATORY_TRACT | 0 refills | Status: DC

## 2020-03-24 MED ORDER — BENZONATATE 100 MG PO CAPS
200.0000 mg | ORAL_CAPSULE | Freq: Once | ORAL | Status: AC
  Administered 2020-03-24: 200 mg via ORAL
  Filled 2020-03-24: qty 2

## 2020-03-24 MED ORDER — AZITHROMYCIN 250 MG PO TABS: ORAL_TABLET | ORAL | 0 refills | Status: DC

## 2020-03-24 MED ORDER — BENZONATATE 100 MG PO CAPS: 100.0000 mg | ORAL_CAPSULE | Freq: Three times a day (TID) | ORAL | 0 refills | Status: DC | PRN

## 2020-03-24 NOTE — ED Notes (Signed)
Provider aware of BP and states pt is okay to be discharged with BP of 183/92

## 2020-03-24 NOTE — Discharge Instructions (Addendum)
As we discussed, your chest x-ray did not show any abnormalities, but given your fever and your cough I will treat you for a mild community-acquired pneumonia.  Please drink plenty of fluids and use the medications I prescribed as instructed on the labels.  You can use over-the-counter ibuprofen and Tylenol according to label instructions as well for comfort, and drink plenty of fluids to stay hydrated.  If you have a primary care provider, please follow-up with the next available opportunity.  If you develop new or worsening symptoms that concern you, please return to the emergency department for additional evaluation.

## 2020-03-24 NOTE — ED Notes (Signed)
Pt very upset about wait. Pt crying states 'they just sit me over there and forget about me". Pt states "professionalism is professionalism, someone could come and talk to me". Pt updated about wait by this rn, apologized to pt multiple times for wait during this conversation. Pt states "they told me not to come over here, to go to [place], I should have listened to them". Pt ambulatory around lobby intermittently. Pt states "that young lady up there told me an hour ago that there were four people ahead of me and four discharges and now an hour has gone by and nothing". Pt offered to speak with charge nurse, pt declines.

## 2020-03-24 NOTE — ED Notes (Signed)
Provider notified pt is complaining of chest heaviness. ER provider notified. No new orders.

## 2020-03-24 NOTE — ED Notes (Addendum)
Pt states cough and congestion with headache started on Saturday. Pt states unable to go to work due to a fever. Pt resting in room with pulse ox and bp monitor on. Pt provided with blanket and is laying on stretcher. Pt states pain to the head and chest

## 2020-03-24 NOTE — ED Provider Notes (Signed)
[facility] Emergency Department Provider Note  ____________________________________________   First MD Initiated Contact with Patient 03/24/20 [phone]     (approximate)  I have reviewed the triage vital signs and the nursing notes.   HISTORY  Chief Complaint Cough    HPI Kim Munoz is a 64 y.o. female who presents for evaluation of about 24 hours of severe cough and feeling of chest congestion.  She said that she has had a fever up to 100.8 measured with a forehead thermometer at home.  She has had no shortness of breath except for associated with the cough.  She denies nausea, vomiting, sore throat, abdominal pain, and dysuria as well as an increase in urinary frequency.  Nothing in particular makes his symptoms better or worse.  She smokes but does not have a history of diagnosed COPD.  She has received both doses of the Moderna COVID-19 vaccination.  She does not have a history of CHF.         Past Medical History:  Diagnosis Date  . Complication of anesthesia    nausea after colonoscopy  . Family history of adverse reaction to anesthesia    sister - nausea aafter surgery  . Hypertension   . Wears dentures    full upper and lower    Patient Active Problem List   Diagnosis Date Noted  . Intractable nausea and vomiting 06/12/2016    Past Surgical History:  Procedure Laterality Date  . ABDOMINAL HYSTERECTOMY    . COLONOSCOPY    . HALLUX VALGUS LAPIDUS Right 04/14/2016   Procedure: HALLUX VALGUS LAPIDUS CORRECTION RIGHT FOOT;  Surgeon: Gwyneth Revels, DPM;  Location: [facility];  Service: Podiatry;  Laterality: Right;  WITH POPLITEAL BLOCK  . HALLUX VALGUS LAPIDUS Right 08/20/2016   Procedure: HALLUX VALGUS LAPIDUS;  Surgeon: Gwyneth Revels, DPM;  Location: [facility];  Service: Podiatry;  Laterality: Right;    Prior to Admission medications   Medication Sig Start Date End Date Taking? Authorizing Provider  albuterol (VENTOLIN HFA) 108  (90 Base) MCG/ACT inhaler Inhale 2-4 puffs by mouth every 4 hours as needed for wheezing, cough, and/or shortness of breath 03/24/20   Loleta Rose, MD  amLODipine (NORVASC) 10 MG tablet Take 10 mg by mouth every morning.     [provider]  azithromycin (ZITHROMAX) 250 MG tablet Take 2 tablets PO on day 1, then take 1 tablet PO daily for 4 more days 03/24/20   Loleta Rose, MD  benzonatate (TESSALON PERLES) 100 MG capsule Take 1 capsule (100 mg total) by mouth 3 (three) times daily as needed for cough. 03/24/20   Loleta Rose, MD  cyclobenzaprine (FLEXERIL) 5 MG tablet Take 1 tablet (5 mg total) by mouth 3 (three) times daily as needed for muscle spasms. 09/25/17   Menshew, Charlesetta Ivory, PA-C  hydrochlorothiazide (HYDRODIURIL) 12.5 MG tablet Take 12.5 mg by mouth every morning.  04/09/16 04/09/17  [provider]  ibuprofen (ADVIL,MOTRIN) 800 MG tablet Take 1 tablet (800 mg total) by mouth every 8 (eight) hours as needed for moderate pain. 01/09/17   Irean Hong, MD  ketorolac (TORADOL) 10 MG tablet Take 1 tablet (10 mg total) by mouth every 8 (eight) hours. 09/25/17   Menshew, Charlesetta Ivory, PA-C  methylPREDNISolone (MEDROL DOSEPAK) 4 MG TBPK tablet Take as directed 01/09/17   Irean Hong, MD  oxyCODONE-acetaminophen (ROXICET) 5-325 MG tablet Take 1 tablet by mouth every 4 (four) hours as needed for severe  pain. 01/09/17   Irean Hong, MD    Allergies Patient has no known allergies.  No family history on file.  Social History Social History   Tobacco Use  . Smoking status: Former Smoker    Packs/day: 0.50    Years: 35.00    Pack years: 17.50    Types: Cigarettes    Quit date: 04/10/2016    Years since quitting: 3.9  . Smokeless tobacco: Never Used  Substance Use Topics  . Alcohol use: Yes    Comment: 2 drinks/mo  . Drug use: Not on file    Review of Systems Constitutional: Fever to 100.8 yesterday Eyes: No visual changes. ENT: No sore throat. Cardiovascular:  Denies chest pain. Respiratory: Cough and "chest congestion", no shortness of breath except for when coughing. Gastrointestinal: No abdominal pain.  No nausea, no vomiting.  No diarrhea.  No constipation. Genitourinary: Negative for dysuria. Musculoskeletal: Negative for neck pain.  Negative for back pain. Integumentary: Negative for rash. Neurological: Negative for headaches, focal weakness or numbness.   ____________________________________________   PHYSICAL EXAM:  VITAL SIGNS: ED Triage Vitals  Enc Vitals Group     BP 03/23/20 2219 (!) 220/119     Pulse Rate 03/23/20 2219 78     Resp 03/23/20 2219 20     Temp 03/23/20 2219 98.8 F (37.1 C)     Temp Source 03/23/20 2219 Oral     SpO2 03/23/20 2219 100 %     Weight 03/23/20 2215 81.6 kg (180 lb)     Height 03/23/20 2215 1.549 m (5\' 1" )     Head Circumference --      Peak Flow --      Pain Score 03/23/20 2215 0     Pain Loc --      Pain Edu? --      Excl. in GC? --     Constitutional: Alert and oriented.  Appears uncomfortable but not in pain. Eyes: Conjunctivae are normal.  Head: Atraumatic. Nose: No congestion/rhinnorhea. Mouth/Throat: Patient is wearing a mask. Neck: No stridor.  No meningeal signs.   Cardiovascular: Normal rate, regular rhythm. Good peripheral circulation. Grossly normal heart sounds. Respiratory: Normal respiratory effort.  No retractions.  Frequent cough.  No wheezing.  Small "squeak" in the posterior right lower lobe. Gastrointestinal: Soft and nontender. No distention.  Musculoskeletal: No lower extremity tenderness nor edema. No gross deformities of extremities. Neurologic:  Normal speech and language. No gross focal neurologic deficits are appreciated.  Skin:  Skin is warm, dry and intact. Psychiatric: Mood and affect are normal. Speech and behavior are normal.  ____________________________________________   LABS (all labs ordered are listed, but only abnormal results are  displayed)  Labs Reviewed  POC SARS CORONAVIRUS 2 AG -  ED  POC SARS CORONAVIRUS 2 AG   ____________________________________________  EKG  No indication for emergent EKG ____________________________________________  RADIOLOGY I, 2216, personally viewed and evaluated these images (plain radiographs) as part of my medical decision making, as well as reviewing the written report by the radiologist.  ED MD interpretation: No acute abnormality identified on chest x-ray  Official radiology report(s): DG Chest Port 1 View  Result Date: 03/24/2020 CLINICAL DATA:  Cough, congestion with headache which began Saturday EXAM: PORTABLE CHEST 1 VIEW COMPARISON:  Shoulder radiograph 01/09/2017 FINDINGS: No consolidation, features of edema, pneumothorax, or effusion. Pulmonary vascularity is normally distributed. The aorta is calcified. The remaining cardiomediastinal contours are unremarkable. No acute osseous or soft tissue abnormality. Degenerative  changes are present in the imaged spine and shoulders. IMPRESSION: No acute cardiopulmonary abnormality. Aortic Atherosclerosis (ICD10-I70.0). Electronically Signed   By: Kreg ShropshirePrice  DeHay M.D.   On: 03/24/2020 03:42    ____________________________________________   PROCEDURES   Procedure(s) performed (including Critical Care):  Procedures   ____________________________________________   INITIAL IMPRESSION / MDM / ASSESSMENT AND PLAN / ED COURSE  As part of my medical decision making, I reviewed the following data within the electronic MEDICAL RECORD NUMBER Nursing notes reviewed and incorporated, Old chart reviewed, Radiograph reviewed  and Notes from prior ED visits   Differential diagnosis includes, but is not limited to, pneumonia, COVID-19, bronchitis, urinary tract infection, intra-abdominal infection, other infectious source, allergies.  The patient is presenting like a community-acquired pneumonia and she smokes but does not have a  history of COPD.  She has a small squeak in her right lower lobe on stethoscope auscultation and a frequent cough.  She reports a forehead temperature of 100.8 yesterday.  Her chest x-ray is clear without any obvious lobar pneumonia but the presentation suggests a mild pneumonia but she is not hypoxemic and has no tachycardia.  She and I discussed additional evaluation versus empiric treatment.  She does not want lab work and I agreed that it is unlikely to make a decision about the management or disposition.  She also declines a COVID-19 PCR test (the rapid 15-minute antigen test was negative) which I think is appropriate given that she is vaccinated and antigen test was negative.  We agreed on empiric community-acquired pneumonia treatment with medications prescribed as listed below.  I gave her a dose of Tessalon in the emergency department and recommended close outpatient follow-up.  I also gave strict return precautions and she understands and agrees with the plan.           ____________________________________________  FINAL CLINICAL IMPRESSION(S) / ED DIAGNOSES  Final diagnoses:  Viral URI with cough     MEDICATIONS GIVEN DURING THIS VISIT:  Medications  acetaminophen (TYLENOL) tablet 650 mg (650 mg Oral Given 03/24/20 0016)  benzonatate (TESSALON) capsule 200 mg (200 mg Oral Given 03/24/20 0432)     ED Discharge Orders         Ordered    azithromycin (ZITHROMAX) 250 MG tablet  Status:  Discontinued     Reprint     03/24/20 0425    albuterol (VENTOLIN HFA) 108 (90 Base) MCG/ACT inhaler  Status:  Discontinued     Reprint    Note to Pharmacy: Pharmacy may substitute brand and size for insurance-approved equivalent   03/24/20 0425    benzonatate (TESSALON PERLES) 100 MG capsule  3 times daily PRN,   Status:  Discontinued     Reprint     03/24/20 0425    albuterol (VENTOLIN HFA) 108 (90 Base) MCG/ACT inhaler     Discontinue  Reprint    Note to Pharmacy: Pharmacy may substitute  brand and size for insurance-approved equivalent   03/24/20 0502    azithromycin (ZITHROMAX) 250 MG tablet     Discontinue  Reprint     03/24/20 0502    benzonatate (TESSALON PERLES) 100 MG capsule  3 times daily PRN     Discontinue  Reprint     03/24/20 0502          *Please note:  Kim Munoz was evaluated in Emergency Department on 03/24/2020 for the symptoms described in the history of present illness. She was evaluated in the context of the global  COVID-19 pandemic, which necessitated consideration that the patient might be at risk for infection with the SARS-CoV-2 virus that causes COVID-19. Institutional protocols and algorithms that pertain to the evaluation of patients at risk for COVID-19 are in a state of rapid change based on information released by regulatory bodies including the CDC and federal and state organizations. These policies and algorithms were followed during the patient's care in the ED.  Some ED evaluations and interventions may be delayed as a result of limited staffing during and after the pandemic.*  Note:  This document was prepared using Dragon voice recognition software and may include unintentional dictation errors.   Loleta Rose, MD 03/24/20 [phone]

## 2020-06-18 ENCOUNTER — Other Ambulatory Visit: Payer: Self-pay | Admitting: Family Medicine

## 2020-06-18 ENCOUNTER — Other Ambulatory Visit
Admission: RE | Admit: 2020-06-18 | Discharge: 2020-06-18 | Disposition: A | Discharge status: Home / Self Care | Payer: BC Managed Care – PPO | Source: Ambulatory Visit | Attending: Family Medicine | Admitting: Family Medicine

## 2020-06-18 DIAGNOSIS — R053 Chronic cough: Secondary | ICD-10-CM

## 2020-06-18 DIAGNOSIS — R05 Cough: Secondary | ICD-10-CM | POA: Insufficient documentation

## 2020-06-18 DIAGNOSIS — R7989 Other specified abnormal findings of blood chemistry: Secondary | ICD-10-CM

## 2020-06-18 LAB — FIBRIN DERIVATIVES D-DIMER (ARMC ONLY): Fibrin derivatives D-dimer (ARMC): 534.77 ng/mL (FEU) — ABNORMAL HIGH (ref 0.00–499.00)

## 2020-06-19 ENCOUNTER — Other Ambulatory Visit: Payer: Self-pay | Admitting: Family Medicine

## 2020-06-19 ENCOUNTER — Ambulatory Visit
Admission: RE | Admit: 2020-06-19 | Discharge: 2020-06-19 | Disposition: A | Discharge status: Home / Self Care | Payer: BC Managed Care – PPO | Source: Ambulatory Visit | Attending: Family Medicine | Admitting: Family Medicine

## 2020-06-19 ENCOUNTER — Ambulatory Visit: Admission: RE | Admit: 2020-06-19 | Payer: BC Managed Care – PPO | Source: Ambulatory Visit

## 2020-06-19 DIAGNOSIS — R7989 Other specified abnormal findings of blood chemistry: Secondary | ICD-10-CM

## 2020-06-19 DIAGNOSIS — R059 Cough, unspecified: Secondary | ICD-10-CM

## 2020-06-19 MED ORDER — IOPAMIDOL (ISOVUE-370) INJECTION 76%
75.0000 mL | Freq: Once | INTRAVENOUS | Status: AC | PRN
  Administered 2020-06-19: 75 mL via INTRAVENOUS

## 2020-06-20 ENCOUNTER — Other Ambulatory Visit: Payer: Self-pay | Admitting: Family Medicine

## 2020-06-20 DIAGNOSIS — R7989 Other specified abnormal findings of blood chemistry: Secondary | ICD-10-CM

## 2020-09-16 ENCOUNTER — Other Ambulatory Visit: Payer: Self-pay

## 2020-09-16 ENCOUNTER — Emergency Department
Admission: EM | Admit: 2020-09-16 | Discharge: 2020-09-16 | Disposition: A | Discharge status: Home / Self Care | Payer: BC Managed Care – PPO | Attending: Emergency Medicine | Admitting: Emergency Medicine

## 2020-09-16 ENCOUNTER — Emergency Department: Payer: BC Managed Care – PPO

## 2020-09-16 DIAGNOSIS — Z87891 Personal history of nicotine dependence: Secondary | ICD-10-CM | POA: Diagnosis not present

## 2020-09-16 DIAGNOSIS — R079 Chest pain, unspecified: Secondary | ICD-10-CM | POA: Diagnosis present

## 2020-09-16 DIAGNOSIS — Z79899 Other long term (current) drug therapy: Secondary | ICD-10-CM | POA: Insufficient documentation

## 2020-09-16 DIAGNOSIS — I10 Essential (primary) hypertension: Secondary | ICD-10-CM | POA: Diagnosis not present

## 2020-09-16 DIAGNOSIS — U071 COVID-19: Secondary | ICD-10-CM | POA: Diagnosis not present

## 2020-09-16 LAB — BASIC METABOLIC PANEL
Anion gap: 9 (ref 5–15)
BUN: 10 mg/dL (ref 8–23)
CO2: 27 mmol/L (ref 22–32)
Calcium: 9.6 mg/dL (ref 8.9–10.3)
Chloride: 100 mmol/L (ref 98–111)
Creatinine, Ser: 0.91 mg/dL (ref 0.44–1.00)
GFR, Estimated: >60 mL/min (ref >60)
Glucose, Bld: 105 mg/dL — ABNORMAL HIGH (ref 70–99)
Potassium: 3.6 mmol/L (ref 3.5–5.1)
Sodium: 136 mmol/L (ref 135–145)

## 2020-09-16 LAB — RESP PANEL BY RT-PCR (FLU A&B, COVID) ARPGX2
Influenza A by PCR: NEGATIVE
Influenza B by PCR: NEGATIVE
SARS Coronavirus 2 by RT PCR: POSITIVE — AB

## 2020-09-16 LAB — CBC
HCT: 39.4 % (ref 36.0–46.0)
Hemoglobin: 13.8 g/dL (ref 12.0–15.0)
MCH: 28.9 pg (ref 26.0–34.0)
MCHC: 35 g/dL (ref 30.0–36.0)
MCV: 82.4 fL (ref 80.0–100.0)
Platelets: 238 10*3/uL (ref 150–400)
RBC: 4.78 MIL/uL (ref 3.87–5.11)
RDW: 13.9 % (ref 11.5–15.5)
WBC: 5.2 10*3/uL (ref 4.0–10.5)
nRBC: 0 % (ref 0.0–0.2)

## 2020-09-16 LAB — TROPONIN I (HIGH SENSITIVITY): Troponin I (High Sensitivity): 5 ng/L (ref <18)

## 2020-09-16 MED ORDER — ONDANSETRON HCL 4 MG PO TABS: 4.0000 mg | ORAL_TABLET | Freq: Three times a day (TID) | ORAL | 0 refills | Status: DC | PRN

## 2020-09-16 MED ORDER — PREDNISONE 10 MG (21) PO TBPK: ORAL_TABLET | ORAL | 0 refills | Status: DC

## 2020-09-16 MED ORDER — ACETAMINOPHEN 325 MG PO TABS
650.0000 mg | ORAL_TABLET | Freq: Once | ORAL | Status: AC
  Administered 2020-09-16: 15:00:00 650 mg via ORAL
  Filled 2020-09-16: qty 2

## 2020-09-16 NOTE — ED Notes (Signed)
Patient to xray at this time

## 2020-09-16 NOTE — ED Triage Notes (Signed)
Pt comes pov with central and left chest pain going to right arm and shoulder. Started yesterday. Sent by [facility]. HTN.

## 2020-09-16 NOTE — ED Provider Notes (Signed)
[facility] Emergency Department Provider Note    ____________________________________________   I have reviewed the triage vital signs and the nursing notes.   HISTORY  Chief Complaint Chest Pain   History limited by: Not Limited   HPI Kim Munoz is a 64 y.o. female who presents to the emergency department today from walk in clinic because of concern for chest pain. The patient states that her symptoms started last night. Pain is located in the center chest. She describes it as a squeezing sensation. It then radiated to her right shoulder, jaw and arm. She has had associated shortness of breath. Denies any cough. She states that she had chills last night. Has history of asthma and tried her inhaler this morning without significant relief. No known sick contacts.    Records reviewed. Per medical record review patient has a history of HTN  Past Medical History:  Diagnosis Date  . Complication of anesthesia    nausea after colonoscopy  . Family history of adverse reaction to anesthesia    sister - nausea aafter surgery  . Hypertension   . Wears dentures    full upper and lower    Patient Active Problem List   Diagnosis Date Noted  . Intractable nausea and vomiting 06/12/2016    Past Surgical History:  Procedure Laterality Date  . ABDOMINAL HYSTERECTOMY    . COLONOSCOPY    . HALLUX VALGUS LAPIDUS Right 04/14/2016   Procedure: HALLUX VALGUS LAPIDUS CORRECTION RIGHT FOOT;  Surgeon: Gwyneth Revels, DPM;  Location: [facility];  Service: Podiatry;  Laterality: Right;  WITH POPLITEAL BLOCK  . HALLUX VALGUS LAPIDUS Right 08/20/2016   Procedure: HALLUX VALGUS LAPIDUS;  Surgeon: Gwyneth Revels, DPM;  Location: [facility];  Service: Podiatry;  Laterality: Right;    Prior to Admission medications   Medication Sig Start Date End Date Taking? Authorizing Provider  albuterol (VENTOLIN HFA) 108 (90 Base) MCG/ACT inhaler Inhale 2-4 puffs by mouth every 4  hours as needed for wheezing, cough, and/or shortness of breath 03/24/20   Loleta Rose, MD  amLODipine (NORVASC) 10 MG tablet Take 10 mg by mouth every morning.     [provider]  azithromycin (ZITHROMAX) 250 MG tablet Take 2 tablets PO on day 1, then take 1 tablet PO daily for 4 more days 03/24/20   Loleta Rose, MD  benzonatate (TESSALON PERLES) 100 MG capsule Take 1 capsule (100 mg total) by mouth 3 (three) times daily as needed for cough. 03/24/20   Loleta Rose, MD  cyclobenzaprine (FLEXERIL) 5 MG tablet Take 1 tablet (5 mg total) by mouth 3 (three) times daily as needed for muscle spasms. 09/25/17   Menshew, Charlesetta Ivory, PA-C  hydrochlorothiazide (HYDRODIURIL) 12.5 MG tablet Take 12.5 mg by mouth every morning.  04/09/16 04/09/17  [provider]  ibuprofen (ADVIL,MOTRIN) 800 MG tablet Take 1 tablet (800 mg total) by mouth every 8 (eight) hours as needed for moderate pain. 01/09/17   Irean Hong, MD  ketorolac (TORADOL) 10 MG tablet Take 1 tablet (10 mg total) by mouth every 8 (eight) hours. 09/25/17   Menshew, Charlesetta Ivory, PA-C  methylPREDNISolone (MEDROL DOSEPAK) 4 MG TBPK tablet Take as directed 01/09/17   Irean Hong, MD  oxyCODONE-acetaminophen (ROXICET) 5-325 MG tablet Take 1 tablet by mouth every 4 (four) hours as needed for severe pain. 01/09/17   Irean Hong, MD    Allergies Patient has no known allergies.  History reviewed. No pertinent family  history.  Social History Social History   Tobacco Use  . Smoking status: Former Smoker    Packs/day: 0.50    Years: 35.00    Pack years: 17.50    Types: Cigarettes    Quit date: 04/10/2016    Years since quitting: 4.4  . Smokeless tobacco: Never Used  Substance Use Topics  . Alcohol use: Yes    Comment: 2 drinks/mo    Review of Systems Constitutional: Positive for chills.  Eyes: No visual changes. ENT: No sore throat. Cardiovascular: Positive for chest pain. Respiratory: Positive for shortness of  breath. Gastrointestinal: No abdominal pain. Positive for nausea.  Genitourinary: Negative for dysuria. Musculoskeletal: Positive for right shoulder, arm and jaw pain. Skin: Negative for rash. Neurological: Negative for headaches, focal weakness or numbness.  ____________________________________________   PHYSICAL EXAM:  VITAL SIGNS: ED Triage Vitals  Enc Vitals Group     BP 09/16/20 1439 (!) 177/108     Pulse Rate 09/16/20 1439 (!) 103     Resp 09/16/20 1439 18     Temp 09/16/20 1442 (!) 101.8 F (38.8 C)     Temp Source 09/16/20 1442 Oral     SpO2 09/16/20 1439 99 %     Weight 09/16/20 1440 182 lb (82.6 kg)     Height 09/16/20 1440 5\' 1"  (1.549 m)     Head Circumference --      Peak Flow --      Pain Score 09/16/20 1439 10   Constitutional: Alert and oriented.  Eyes: Conjunctivae are normal.  ENT      Head: Normocephalic and atraumatic.      Nose: No congestion/rhinnorhea.      Mouth/Throat: Mucous membranes are moist.      Neck: No stridor. Hematological/Lymphatic/Immunilogical: No cervical lymphadenopathy. Cardiovascular: Normal rate, regular rhythm.  No murmurs, rubs, or gallops.  Respiratory: Normal respiratory effort without tachypnea nor retractions. Breath sounds are clear and equal bilaterally. No wheezes/rales/rhonchi. Gastrointestinal: Soft and non tender. No rebound. No guarding.  Genitourinary: Deferred Musculoskeletal: Normal range of motion in all extremities. No lower extremity edema. Neurologic:  Normal speech and language. No gross focal neurologic deficits are appreciated.  Skin:  Skin is warm, dry and intact. No rash noted. Psychiatric: Mood and affect are normal. Speech and behavior are normal. Patient exhibits appropriate insight and judgment.  ____________________________________________    LABS (pertinent positives/negatives)  Trop hs 5 CBC wbc 5.2, hgb 13.8, plt 238 BMP wnl except glu 105 COVID  positive ____________________________________________   EKG  I, 09/18/20, attending physician, personally viewed and interpreted this EKG  EKG Time: 1434 Rate: 93 Rhythm: normal sinus rhythm Axis: normal Intervals: qtc 457 QRS: LVH ST changes: no st elevation Impression: abnormal ekg  ____________________________________________    RADIOLOGY  CXR No acute cardiopulmonary disease  ____________________________________________   PROCEDURES  Procedures  ____________________________________________   INITIAL IMPRESSION / ASSESSMENT AND PLAN / ED COURSE  Pertinent labs & imaging results that were available during my care of the patient were reviewed by me and considered in my medical decision making (see chart for details).   Patient presented to the emergency department today because of concern for chest pain. Also complained of chills. Was found to be febrile here. CXR without any pneumonia. She did test positive for COVID. At this time I do think this explains her symptoms. Have low suspicion for blood clot given lack of hypoxia or significant respiratory distress. Discussed diagnosis with patient. Will send patient information to  monoclonal antibody infusion clinic. Will give prescription for zofran and prednisone to help with symptoms. Discussed obtaining a pulse oximeter to help monitor her oxygen levels.  ____________________________________________   FINAL CLINICAL IMPRESSION(S) / ED DIAGNOSES  Final diagnoses:  COVID-19     Note: This dictation was prepared with Dragon dictation. Any transcriptional errors that result from this process are unintentional     Phineas Semen, MD 09/16/20 [phone]

## 2020-09-16 NOTE — Discharge Instructions (Addendum)
As we discussed you can check your oxygen levels at home with a pulse oximeter (which can be ordered online or purchased for you by someone at a pharmacy), and if your oxygen levels start falling below 90 you should return. Your information has been passed on to your monoclonal antibody infusion clinic. Please seek medical attention for any high fevers, chest pain, shortness of breath, change in behavior, persistent vomiting, bloody stool or any other new or concerning symptoms.

## 2020-09-16 NOTE — ED Notes (Signed)
Date and time results received: 09/16/20 1551 (use smartphrase ".now" to insert current time)  Test: Covid Critical Value: positive  Name of Provider Notified: Derrill Kay, MD  Orders Received? Or Actions Taken?: Orders Received - See Orders for details

## 2021-01-08 ENCOUNTER — Emergency Department: Payer: BC Managed Care – PPO

## 2021-01-08 ENCOUNTER — Emergency Department
Admission: EM | Admit: 2021-01-08 | Discharge: 2021-01-08 | Disposition: A | Discharge status: Home / Self Care | Payer: BC Managed Care – PPO | Source: Home / Self Care | Attending: Student in an Organized Health Care Education/Training Program | Admitting: Student in an Organized Health Care Education/Training Program

## 2021-01-08 ENCOUNTER — Other Ambulatory Visit: Payer: Self-pay

## 2021-01-08 DIAGNOSIS — K529 Noninfective gastroenteritis and colitis, unspecified: Secondary | ICD-10-CM

## 2021-01-08 DIAGNOSIS — Z87891 Personal history of nicotine dependence: Secondary | ICD-10-CM | POA: Insufficient documentation

## 2021-01-08 DIAGNOSIS — R109 Unspecified abdominal pain: Secondary | ICD-10-CM | POA: Diagnosis not present

## 2021-01-08 DIAGNOSIS — R1084 Generalized abdominal pain: Secondary | ICD-10-CM

## 2021-01-08 DIAGNOSIS — K5000 Crohn's disease of small intestine without complications: Secondary | ICD-10-CM | POA: Diagnosis not present

## 2021-01-08 LAB — CBC
HCT: 41.6 % (ref 36.0–46.0)
Hemoglobin: 14.1 g/dL (ref 12.0–15.0)
MCH: 27.8 pg (ref 26.0–34.0)
MCHC: 33.9 g/dL (ref 30.0–36.0)
MCV: 81.9 fL (ref 80.0–100.0)
Platelets: 312 10*3/uL (ref 150–400)
RBC: 5.08 MIL/uL (ref 3.87–5.11)
RDW: 14 % (ref 11.5–15.5)
WBC: 7.4 10*3/uL (ref 4.0–10.5)
nRBC: 0 % (ref 0.0–0.2)

## 2021-01-08 LAB — URINALYSIS, COMPLETE (UACMP) WITH MICROSCOPIC
Bilirubin Urine: NEGATIVE
Glucose, UA: NEGATIVE mg/dL
Ketones, ur: NEGATIVE mg/dL
Nitrite: NEGATIVE
Protein, ur: 30 mg/dL — AB
Specific Gravity, Urine: 1.016 (ref 1.005–1.030)
pH: 5 (ref 5.0–8.0)

## 2021-01-08 LAB — COMPREHENSIVE METABOLIC PANEL
ALT: 11 U/L (ref 0–44)
AST: 12 U/L — ABNORMAL LOW (ref 15–41)
Albumin: 3.8 g/dL (ref 3.5–5.0)
Alkaline Phosphatase: 24 U/L — ABNORMAL LOW (ref 38–126)
Anion gap: 10 (ref 5–15)
BUN: 11 mg/dL (ref 8–23)
CO2: 26 mmol/L (ref 22–32)
Calcium: 9.7 mg/dL (ref 8.9–10.3)
Chloride: 104 mmol/L (ref 98–111)
Creatinine, Ser: 1.09 mg/dL — ABNORMAL HIGH (ref 0.44–1.00)
GFR, Estimated: 57 mL/min — ABNORMAL LOW (ref >60)
Glucose, Bld: 108 mg/dL — ABNORMAL HIGH (ref 70–99)
Potassium: 3.7 mmol/L (ref 3.5–5.1)
Sodium: 140 mmol/L (ref 135–145)
Total Bilirubin: 1.3 mg/dL — ABNORMAL HIGH (ref 0.3–1.2)
Total Protein: 7.3 g/dL (ref 6.5–8.1)

## 2021-01-08 LAB — LIPASE, BLOOD: Lipase: 33 U/L (ref 11–51)

## 2021-01-08 MED ORDER — ONDANSETRON HCL 4 MG/2ML IJ SOLN
4.0000 mg | Freq: Once | INTRAMUSCULAR | Status: AC
  Administered 2021-01-08: 4 mg via INTRAVENOUS
  Filled 2021-01-08: qty 2

## 2021-01-08 MED ORDER — HYDROCODONE-ACETAMINOPHEN 5-325 MG PO TABS
1.0000 | ORAL_TABLET | Freq: Once | ORAL | Status: AC
  Administered 2021-01-08: 1 via ORAL
  Filled 2021-01-08: qty 1

## 2021-01-08 MED ORDER — SODIUM CHLORIDE 0.9 % IV BOLUS
1000.0000 mL | Freq: Once | INTRAVENOUS | Status: AC
  Administered 2021-01-08: 1000 mL via INTRAVENOUS

## 2021-01-08 MED ORDER — HYDROCODONE-ACETAMINOPHEN 5-325 MG PO TABS: 1.0000 | ORAL_TABLET | Freq: Four times a day (QID) | ORAL | 0 refills | Status: DC | PRN

## 2021-01-08 MED ORDER — MORPHINE SULFATE (PF) 4 MG/ML IV SOLN
4.0000 mg | INTRAVENOUS | Status: DC | PRN
  Administered 2021-01-08: 4 mg via INTRAVENOUS
  Filled 2021-01-08: qty 1

## 2021-01-08 MED ORDER — AMLODIPINE BESYLATE 5 MG PO TABS
10.0000 mg | ORAL_TABLET | Freq: Once | ORAL | Status: AC
  Administered 2021-01-08: 10 mg via ORAL
  Filled 2021-01-08: qty 2

## 2021-01-08 MED ORDER — ONDANSETRON HCL 4 MG PO TABS: 4.0000 mg | ORAL_TABLET | Freq: Every day | ORAL | 0 refills | Status: DC | PRN

## 2021-01-08 MED ORDER — IOHEXOL 300 MG/ML  SOLN
100.0000 mL | Freq: Once | INTRAMUSCULAR | Status: AC | PRN
  Administered 2021-01-08: 100 mL via INTRAVENOUS

## 2021-01-08 NOTE — ED Notes (Signed)
IV catheter removed intact without complication.  D/C instructions given.  RX and follow up discussed.  All questions addressed, pt instructed not to drive.   Understanding verbalized.  Pt left ER via w/c, to  transported home with nephew.

## 2021-01-08 NOTE — ED Notes (Signed)
Informed provider of elevated BP.  Awaiting further orders.  Pt remains on continuous monitoring.

## 2021-01-08 NOTE — ED Provider Notes (Signed)
-----------------------------------------   6:33 PM on 01/08/2021 -----------------------------------------  Patient care assumed from Dr. Roxan Hockey.  Patient tolerating pain medication without issue.  States the pain medicine is worked for her pain but she is beginning to feel her pain returned somewhat.  We will discharge patient home with pain medication.  She has follow-up with Dr. Tobi Bastos.  Patient agreeable to plan of care.   Minna Antis, MD 01/08/21 [phone]

## 2021-01-08 NOTE — ED Provider Notes (Signed)
[facility] Emergency Department Provider Note    Event Date/Time   First MD Initiated Contact with Patient 01/08/21 1309     (approximate)  I have reviewed the triage vital signs and the nursing notes.   HISTORY  Chief Complaint Abdominal Pain and Emesis    HPI Kim Munoz is a 65 y.o. female presents to the ER for evaluation of 2 days of progressively worsening nausea vomiting diarrhea crampy abdominal pain generalized discomfort and feels aching pain.  No dysuria.  No flank pain.  No measured fevers.  No recent antibiotics.  She does smoke does not drink alcohol.  She is a diarrhea as loose brown.  No melena or hematochezia.    Past Medical History:  Diagnosis Date  . Complication of anesthesia    nausea after colonoscopy  . Family history of adverse reaction to anesthesia    sister - nausea aafter surgery  . Hypertension   . Wears dentures    full upper and lower   No family history on file. Past Surgical History:  Procedure Laterality Date  . ABDOMINAL HYSTERECTOMY    . COLONOSCOPY    . HALLUX VALGUS LAPIDUS Right 04/14/2016   Procedure: HALLUX VALGUS LAPIDUS CORRECTION RIGHT FOOT;  Surgeon: Gwyneth Revels, DPM;  Location: [facility];  Service: Podiatry;  Laterality: Right;  WITH POPLITEAL BLOCK  . HALLUX VALGUS LAPIDUS Right 08/20/2016   Procedure: HALLUX VALGUS LAPIDUS;  Surgeon: Gwyneth Revels, DPM;  Location: [facility] OR[facility];  Service: Podiatry;  Laterality: Right;   Patient Active Problem List   Diagnosis Date Noted  . Intractable nausea and vomiting 06/12/2016      Prior to Admission medications   Medication Sig Start Date End Date Taking? Authorizing Provider  HYDROcodone-acetaminophen (NORCO) 5-325 MG tablet Take 1 tablet by mouth every 6 (six) hours as needed for moderate pain. 01/08/21  Yes Willy Eddy, MD  ondansetron (ZOFRAN) 4 MG tablet Take 1 tablet (4 mg total) by mouth daily as needed. 01/08/21 01/08/22 Yes Willy Eddy, MD  albuterol (VENTOLIN HFA) 108 (90 Base) MCG/ACT inhaler Inhale 2-4 puffs by mouth every 4 hours as needed for wheezing, cough, and/or shortness of breath 03/24/20   Loleta Rose, MD  amLODipine (NORVASC) 10 MG tablet Take 10 mg by mouth every morning.     [provider]  azithromycin (ZITHROMAX) 250 MG tablet Take 2 tablets PO on day 1, then take 1 tablet PO daily for 4 more days 03/24/20   Loleta Rose, MD  benzonatate (TESSALON PERLES) 100 MG capsule Take 1 capsule (100 mg total) by mouth 3 (three) times daily as needed for cough. 03/24/20   Loleta Rose, MD  cyclobenzaprine (FLEXERIL) 5 MG tablet Take 1 tablet (5 mg total) by mouth 3 (three) times daily as needed for muscle spasms. 09/25/17   Menshew, Charlesetta Ivory, PA-C  hydrochlorothiazide (HYDRODIURIL) 12.5 MG tablet Take 12.5 mg by mouth every morning.  04/09/16 04/09/17  [provider]  ibuprofen (ADVIL,MOTRIN) 800 MG tablet Take 1 tablet (800 mg total) by mouth every 8 (eight) hours as needed for moderate pain. 01/09/17   Irean Hong, MD  ketorolac (TORADOL) 10 MG tablet Take 1 tablet (10 mg total) by mouth every 8 (eight) hours. 09/25/17   Menshew, Charlesetta Ivory, PA-C  methylPREDNISolone (MEDROL DOSEPAK) 4 MG TBPK tablet Take as directed 01/09/17   Irean Hong, MD  ondansetron (ZOFRAN) 4 MG tablet Take 1 tablet (4 mg total) by mouth  every 8 (eight) hours as needed. 09/16/20   Phineas Semen, MD  oxyCODONE-acetaminophen (ROXICET) 5-325 MG tablet Take 1 tablet by mouth every 4 (four) hours as needed for severe pain. 01/09/17   Irean Hong, MD  predniSONE (STERAPRED UNI-PAK 21 TAB) 10 MG (21) TBPK tablet Per packaging instructions 09/16/20   Phineas Semen, MD    Allergies Patient has no known allergies.    Social History Social History   Tobacco Use  . Smoking status: Former Smoker    Packs/day: 0.50    Years: 35.00    Pack years: 17.50    Types: Cigarettes    Quit date: 04/10/2016    Years since  quitting: 4.7  . Smokeless tobacco: Never Used  Substance Use Topics  . Alcohol use: Yes    Comment: 2 drinks/mo    Review of Systems Patient denies headaches, rhinorrhea, blurry vision, numbness, shortness of breath, chest pain, edema, cough, abdominal pain, nausea, vomiting, diarrhea, dysuria, fevers, rashes or hallucinations unless otherwise stated above in HPI. ____________________________________________   PHYSICAL EXAM:  VITAL SIGNS: Vitals:   01/08/21 1425 01/08/21 1500  BP: (!) 201/98 (!) 191/95  Pulse: (!) 56 77  Resp: 18 18  Temp: 98.9 F (37.2 C)   SpO2: 99% 96%    Constitutional: Alert and oriented.  Eyes: Conjunctivae are normal.  Head: Atraumatic. Nose: No congestion/rhinnorhea. Mouth/Throat: Mucous membranes are moist.   Neck: No stridor. Painless ROM.  Cardiovascular: Normal rate, regular rhythm. Grossly normal heart sounds.  Good peripheral circulation. Respiratory: Normal respiratory effort.  No retractions. Lungs CTAB. Gastrointestinal: Soft and nontender. No distention. No abdominal bruits. No CVA tenderness. Genitourinary:  Musculoskeletal: No lower extremity tenderness nor edema.  No joint effusions. Neurologic:  Normal speech and language. No gross focal neurologic deficits are appreciated. No facial droop Skin:  Skin is warm, dry and intact. No rash noted. Psychiatric: Mood and affect are normal. Speech and behavior are normal.  ____________________________________________   LABS (all labs ordered are listed, but only abnormal results are displayed)  Results for orders placed or performed during the hospital encounter of 01/08/21 (from the past 24 hour(s))  Lipase, blood     Status: None   Collection Time: 01/08/21  1:30 PM  Result Value Ref Range   Lipase 33 11 - 51 U/L  Comprehensive metabolic panel     Status: Abnormal   Collection Time: 01/08/21  1:30 PM  Result Value Ref Range   Sodium 140 135 - 145 mmol/L   Potassium 3.7 3.5 - 5.1  mmol/L   Chloride 104 98 - 111 mmol/L   CO2 26 22 - 32 mmol/L   Glucose, Bld 108 (H) 70 - 99 mg/dL   BUN 11 8 - 23 mg/dL   Creatinine, Ser 9.14 (H) 0.44 - 1.00 mg/dL   Calcium 9.7 8.9 - 78.2 mg/dL   Total Protein 7.3 6.5 - 8.1 g/dL   Albumin 3.8 3.5 - 5.0 g/dL   AST 12 (L) 15 - 41 U/L   ALT 11 0 - 44 U/L   Alkaline Phosphatase 24 (L) 38 - 126 U/L   Total Bilirubin 1.3 (H) 0.3 - 1.2 mg/dL   GFR, Estimated 57 (L) >60 mL/min   Anion gap 10 5 - 15  CBC     Status: None   Collection Time: 01/08/21  1:30 PM  Result Value Ref Range   WBC 7.4 4.0 - 10.5 K/uL   RBC 5.08 3.87 - 5.11 MIL/uL   Hemoglobin  14.1 12.0 - 15.0 g/dL   HCT 27.0 35.0 - 09.3 %   MCV 81.9 80.0 - 100.0 fL   MCH 27.8 26.0 - 34.0 pg   MCHC 33.9 30.0 - 36.0 g/dL   RDW 81.8 29.9 - 37.1 %   Platelets 312 150 - 400 K/uL   nRBC 0.0 0.0 - 0.2 %  Urinalysis, Complete w Microscopic Urine, Clean Catch     Status: Abnormal   Collection Time: 01/08/21  1:30 PM  Result Value Ref Range   Color, Urine YELLOW (A) YELLOW   APPearance CLOUDY (A) CLEAR   Specific Gravity, Urine 1.016 1.005 - 1.030   pH 5.0 5.0 - 8.0   Glucose, UA NEGATIVE NEGATIVE mg/dL   Hgb urine dipstick SMALL (A) NEGATIVE   Bilirubin Urine NEGATIVE NEGATIVE   Ketones, ur NEGATIVE NEGATIVE mg/dL   Protein, ur 30 (A) NEGATIVE mg/dL   Nitrite NEGATIVE NEGATIVE   Leukocytes,Ua SMALL (A) NEGATIVE   RBC / HPF 0-5 0 - 5 RBC/hpf   WBC, UA 11-20 0 - 5 WBC/hpf   Bacteria, UA FEW (A) NONE SEEN   Squamous Epithelial / LPF 21-50 0 - 5   Mucus PRESENT    Hyaline Casts, UA PRESENT    ____________________________________________  EKG My review and personal interpretation at Time: 15:51   Indication: abd pain  Rate: 60  Rhythm: sinus Axis: normal Other: normal intervals, no stemi, no depressions ____________________________________________  RADIOLOGY  I personally reviewed all radiographic images ordered to evaluate for the above acute complaints and reviewed  radiology reports and findings.  These findings were personally discussed with the patient.  Please see medical record for radiology report.  ____________________________________________   PROCEDURES  Procedure(s) performed:  Procedures    Critical Care performed: no ____________________________________________   INITIAL IMPRESSION / ASSESSMENT AND PLAN / ED COURSE  Pertinent labs & imaging results that were available during my care of the patient were reviewed by me and considered in my medical decision making (see chart for details).   DDX: Diverticulitis, colitis, enteritis, SBO, mass, dehydration, electrolyte abnormality, cystitis, pancreatitis, biliary pathology, hepatitis  Betzaira Mentel is a 65 y.o. who presents to the ED with sensation as described above.  Patient uncomfortable appearing but afebrile.  Her abdominal exam is fairly benign.  Given age and presentation will order CT imaging.  Will provide IV pain medication IV fluids and reassess.  Clinical Course as of 01/08/21 1605  Thu Jan 08, 2021  1533 Patient with improvement in her symptoms but still feeling mild discomfort.  No sign of stone or UTI.  No appendicitis.  Possible IBD.  Will send for stool studies.  Will consult GI. [PR]  1543 Discussed patient's presentation and results of CT imaging with Dr. Tobi Bastos GI.  Plan will be to p.o. challenge.  She is currently not have any significant discomfort and as long she is tolerating p.o. would be appropriate for close outpatient follow-up.  I will send stool studies to see if this is simply infectious.  Patient agreeable to plan.  Patient be signed out to oncoming physician pending p.o. challenge.  She is still having worsening nausea and not tolerating p.o. or worsening pain may need admission. [PR]    Clinical Course User Index [PR] Willy Eddy, MD    The patient was evaluated in Emergency Department today for the symptoms described in the history of present illness.  He/she was evaluated in the context of the global COVID-19 pandemic, which necessitated consideration that  the patient might be at risk for infection with the SARS-CoV-2 virus that causes COVID-19. Institutional protocols and algorithms that pertain to the evaluation of patients at risk for COVID-19 are in a state of rapid change based on information released by regulatory bodies including the CDC and federal and state organizations. These policies and algorithms were followed during the patient's care in the ED.  As part of my medical decision making, I reviewed the following data within the electronic MEDICAL RECORD NUMBER Nursing notes reviewed and incorporated, Labs reviewed, notes from prior ED visits and [place] Controlled Substance Database   ____________________________________________   FINAL CLINICAL IMPRESSION(S) / ED DIAGNOSES  Final diagnoses:  Generalized abdominal pain  Enteritis      NEW MEDICATIONS STARTED DURING THIS VISIT:  New Prescriptions   HYDROCODONE-ACETAMINOPHEN (NORCO) 5-325 MG TABLET    Take 1 tablet by mouth every 6 (six) hours as needed for moderate pain.   ONDANSETRON (ZOFRAN) 4 MG TABLET    Take 1 tablet (4 mg total) by mouth daily as needed.     Note:  This document was prepared using Dragon voice recognition software and may include unintentional dictation errors.    Willy Eddyobinson, Mckoy Bhakta, MD 01/08/21 [phone]

## 2021-01-08 NOTE — Discharge Instructions (Signed)

## 2021-01-08 NOTE — ED Triage Notes (Signed)
Pt to ed from [facility] for emesis and mid abdominal pain since Tuesday night.  Hx htn, has not taken meds today

## 2021-01-08 NOTE — ED Notes (Signed)
Pt wheeled to lobby, helped into family member's car

## 2021-01-11 ENCOUNTER — Inpatient Hospital Stay
Admission: EM | Admit: 2021-01-11 | Discharge: 2021-01-13 | DRG: 386 | Disposition: A | Discharge status: Home / Self Care | Payer: BC Managed Care – PPO | Attending: Internal Medicine | Admitting: Internal Medicine

## 2021-01-11 ENCOUNTER — Other Ambulatory Visit: Payer: Self-pay

## 2021-01-11 ENCOUNTER — Encounter: Payer: Self-pay | Admitting: Intensive Care

## 2021-01-11 DIAGNOSIS — I700 Atherosclerosis of aorta: Secondary | ICD-10-CM | POA: Diagnosis present

## 2021-01-11 DIAGNOSIS — Z7952 Long term (current) use of systemic steroids: Secondary | ICD-10-CM | POA: Diagnosis not present

## 2021-01-11 DIAGNOSIS — I10 Essential (primary) hypertension: Secondary | ICD-10-CM | POA: Diagnosis present

## 2021-01-11 DIAGNOSIS — A09 Infectious gastroenteritis and colitis, unspecified: Secondary | ICD-10-CM | POA: Diagnosis present

## 2021-01-11 DIAGNOSIS — E669 Obesity, unspecified: Secondary | ICD-10-CM | POA: Diagnosis present

## 2021-01-11 DIAGNOSIS — Z6834 Body mass index (BMI) 34.0-34.9, adult: Secondary | ICD-10-CM | POA: Diagnosis not present

## 2021-01-11 DIAGNOSIS — F17210 Nicotine dependence, cigarettes, uncomplicated: Secondary | ICD-10-CM | POA: Diagnosis present

## 2021-01-11 DIAGNOSIS — Z79899 Other long term (current) drug therapy: Secondary | ICD-10-CM | POA: Diagnosis not present

## 2021-01-11 DIAGNOSIS — Z841 Family history of disorders of kidney and ureter: Secondary | ICD-10-CM

## 2021-01-11 DIAGNOSIS — R911 Solitary pulmonary nodule: Secondary | ICD-10-CM | POA: Diagnosis present

## 2021-01-11 DIAGNOSIS — E876 Hypokalemia: Secondary | ICD-10-CM | POA: Diagnosis present

## 2021-01-11 DIAGNOSIS — R109 Unspecified abdominal pain: Secondary | ICD-10-CM | POA: Diagnosis present

## 2021-01-11 DIAGNOSIS — R112 Nausea with vomiting, unspecified: Secondary | ICD-10-CM | POA: Diagnosis not present

## 2021-01-11 DIAGNOSIS — K5000 Crohn's disease of small intestine without complications: Principal | ICD-10-CM | POA: Diagnosis present

## 2021-01-11 DIAGNOSIS — Z720 Tobacco use: Secondary | ICD-10-CM | POA: Diagnosis present

## 2021-01-11 DIAGNOSIS — K429 Umbilical hernia without obstruction or gangrene: Secondary | ICD-10-CM | POA: Diagnosis present

## 2021-01-11 DIAGNOSIS — K529 Noninfective gastroenteritis and colitis, unspecified: Secondary | ICD-10-CM | POA: Diagnosis not present

## 2021-01-11 DIAGNOSIS — Z20822 Contact with and (suspected) exposure to covid-19: Secondary | ICD-10-CM | POA: Diagnosis present

## 2021-01-11 LAB — CBC
HCT: 39.8 % (ref 36.0–46.0)
Hemoglobin: 13.8 g/dL (ref 12.0–15.0)
MCH: 28.1 pg (ref 26.0–34.0)
MCHC: 34.7 g/dL (ref 30.0–36.0)
MCV: 81.1 fL (ref 80.0–100.0)
Platelets: 282 10*3/uL (ref 150–400)
RBC: 4.91 MIL/uL (ref 3.87–5.11)
RDW: 13.4 % (ref 11.5–15.5)
WBC: 5 10*3/uL (ref 4.0–10.5)
nRBC: 0 % (ref 0.0–0.2)

## 2021-01-11 LAB — URINALYSIS, COMPLETE (UACMP) WITH MICROSCOPIC
Bilirubin Urine: NEGATIVE
Glucose, UA: NEGATIVE mg/dL
Ketones, ur: 20 mg/dL — AB
Leukocytes,Ua: NEGATIVE
Nitrite: NEGATIVE
Protein, ur: NEGATIVE mg/dL
Specific Gravity, Urine: 1.011 (ref 1.005–1.030)
pH: 6 (ref 5.0–8.0)

## 2021-01-11 LAB — COMPREHENSIVE METABOLIC PANEL
ALT: 12 U/L (ref 0–44)
AST: 15 U/L (ref 15–41)
Albumin: 3.7 g/dL (ref 3.5–5.0)
Alkaline Phosphatase: 25 U/L — ABNORMAL LOW (ref 38–126)
Anion gap: 10 (ref 5–15)
BUN: 11 mg/dL (ref 8–23)
CO2: 25 mmol/L (ref 22–32)
Calcium: 9.3 mg/dL (ref 8.9–10.3)
Chloride: 102 mmol/L (ref 98–111)
Creatinine, Ser: 0.81 mg/dL (ref 0.44–1.00)
GFR, Estimated: >60 mL/min (ref >60)
Glucose, Bld: 111 mg/dL — ABNORMAL HIGH (ref 70–99)
Potassium: 3.2 mmol/L — ABNORMAL LOW (ref 3.5–5.1)
Sodium: 137 mmol/L (ref 135–145)
Total Bilirubin: 1.3 mg/dL — ABNORMAL HIGH (ref 0.3–1.2)
Total Protein: 7.2 g/dL (ref 6.5–8.1)

## 2021-01-11 LAB — RESP PANEL BY RT-PCR (FLU A&B, COVID) ARPGX2
Influenza A by PCR: NEGATIVE
Influenza B by PCR: NEGATIVE
SARS Coronavirus 2 by RT PCR: NEGATIVE

## 2021-01-11 LAB — LIPASE, BLOOD: Lipase: 36 U/L (ref 11–51)

## 2021-01-11 MED ORDER — DEXTROSE 5 % AND 0.9 % NACL IV BOLUS: 1000.0000 mL | Freq: Once | INTRAVENOUS | Status: DC

## 2021-01-11 MED ORDER — MORPHINE SULFATE (PF) 4 MG/ML IV SOLN
4.0000 mg | Freq: Once | INTRAVENOUS | Status: AC
Start: 2021-01-11 — End: 2021-01-11
  Administered 2021-01-11: 4 mg via INTRAVENOUS
  Filled 2021-01-11: qty 1

## 2021-01-11 MED ORDER — NICOTINE 21 MG/24HR TD PT24
21.0000 mg | MEDICATED_PATCH | Freq: Every day | TRANSDERMAL | Status: DC
  Filled 2021-01-11: qty 1

## 2021-01-11 MED ORDER — HYDROMORPHONE HCL 1 MG/ML IJ SOLN
1.0000 mg | Freq: Once | INTRAMUSCULAR | Status: AC
Start: 2021-01-11 — End: 2021-01-11
  Administered 2021-01-11: 1 mg via INTRAVENOUS
  Filled 2021-01-11: qty 1

## 2021-01-11 MED ORDER — POTASSIUM CHLORIDE CRYS ER 20 MEQ PO TBCR
40.0000 meq | EXTENDED_RELEASE_TABLET | Freq: Once | ORAL | Status: AC
  Administered 2021-01-11: 40 meq via ORAL
  Filled 2021-01-11: qty 2

## 2021-01-11 MED ORDER — PIPERACILLIN-TAZOBACTAM 3.375 G IVPB
3.3750 g | Freq: Three times a day (TID) | INTRAVENOUS | Status: DC
  Administered 2021-01-11 – 2021-01-13 (×5): 3.375 g via INTRAVENOUS
  Filled 2021-01-11 (×6): qty 50

## 2021-01-11 MED ORDER — OXYCODONE-ACETAMINOPHEN 5-325 MG PO TABS
1.0000 | ORAL_TABLET | ORAL | Status: DC | PRN
Start: 2021-01-11 — End: 2021-01-13

## 2021-01-11 MED ORDER — ONDANSETRON HCL 4 MG/2ML IJ SOLN
4.0000 mg | Freq: Three times a day (TID) | INTRAMUSCULAR | Status: DC | PRN
  Administered 2021-01-11 – 2021-01-13 (×3): 4 mg via INTRAVENOUS
  Filled 2021-01-11 (×3): qty 2

## 2021-01-11 MED ORDER — ONDANSETRON HCL 4 MG/2ML IJ SOLN: 4.0000 mg | Freq: Once | INTRAMUSCULAR | Status: AC

## 2021-01-11 MED ORDER — ENOXAPARIN SODIUM 40 MG/0.4ML ~~LOC~~ SOLN
40.0000 mg | SUBCUTANEOUS | Status: DC
  Administered 2021-01-11 – 2021-01-12 (×2): 40 mg via SUBCUTANEOUS
  Filled 2021-01-11 (×2): qty 0.4

## 2021-01-11 MED ORDER — METRONIDAZOLE IN NACL 5-0.79 MG/ML-% IV SOLN
500.0000 mg | Freq: Once | INTRAVENOUS | Status: AC
  Administered 2021-01-11: 500 mg via INTRAVENOUS
  Filled 2021-01-11: qty 100

## 2021-01-11 MED ORDER — MORPHINE SULFATE (PF) 2 MG/ML IV SOLN
1.0000 mg | INTRAVENOUS | Status: DC | PRN
  Administered 2021-01-11: 1 mg via INTRAVENOUS
  Filled 2021-01-11: qty 1

## 2021-01-11 MED ORDER — LACTATED RINGERS IV BOLUS
1000.0000 mL | Freq: Once | INTRAVENOUS | Status: AC
  Administered 2021-01-11: 1000 mL via INTRAVENOUS

## 2021-01-11 MED ORDER — POTASSIUM CHLORIDE 10 MEQ/100ML IV SOLN
10.0000 meq | INTRAVENOUS | Status: AC
  Administered 2021-01-11 (×2): 10 meq via INTRAVENOUS
  Filled 2021-01-11 (×2): qty 100

## 2021-01-11 MED ORDER — PIPERACILLIN-TAZOBACTAM 3.375 G IVPB: 3.3750 g | Freq: Three times a day (TID) | INTRAVENOUS | Status: DC

## 2021-01-11 MED ORDER — AMLODIPINE BESYLATE 10 MG PO TABS
10.0000 mg | ORAL_TABLET | Freq: Every day | ORAL | Status: DC
  Administered 2021-01-11 – 2021-01-12 (×2): 10 mg via ORAL
  Filled 2021-01-11 (×2): qty 1

## 2021-01-11 MED ORDER — ACETAMINOPHEN 325 MG PO TABS
650.0000 mg | ORAL_TABLET | Freq: Four times a day (QID) | ORAL | Status: DC | PRN
  Administered 2021-01-12: 650 mg via ORAL
  Filled 2021-01-11: qty 2

## 2021-01-11 MED ORDER — HYDRALAZINE HCL 20 MG/ML IJ SOLN
5.0000 mg | INTRAMUSCULAR | Status: DC | PRN
  Administered 2021-01-11: 5 mg via INTRAVENOUS
  Filled 2021-01-11: qty 1

## 2021-01-11 MED ORDER — LORATADINE 10 MG PO TABS: 10.0000 mg | ORAL_TABLET | Freq: Every day | ORAL | Status: DC | PRN

## 2021-01-11 MED ORDER — LACTATED RINGERS IV SOLN: INTRAVENOUS | Status: DC

## 2021-01-11 MED ORDER — METHYLPREDNISOLONE SODIUM SUCC 125 MG IJ SOLR
125.0000 mg | Freq: Once | INTRAMUSCULAR | Status: AC
  Administered 2021-01-11: 125 mg via INTRAVENOUS
  Filled 2021-01-11: qty 2

## 2021-01-11 MED ORDER — ALBUTEROL SULFATE HFA 108 (90 BASE) MCG/ACT IN AERS
2.0000 | INHALATION_SPRAY | RESPIRATORY_TRACT | Status: DC | PRN
  Filled 2021-01-11: qty 6.7

## 2021-01-11 MED ORDER — ONDANSETRON HCL 4 MG/2ML IJ SOLN
4.0000 mg | Freq: Once | INTRAMUSCULAR | Status: AC
  Administered 2021-01-11: 4 mg via INTRAVENOUS
  Filled 2021-01-11: qty 2

## 2021-01-11 MED ORDER — METHYLPREDNISOLONE SODIUM SUCC 125 MG IJ SOLR
80.0000 mg | Freq: Once | INTRAMUSCULAR | Status: AC
  Administered 2021-01-11: 80 mg via INTRAVENOUS
  Filled 2021-01-11: qty 2

## 2021-01-11 MED ORDER — ONDANSETRON HCL 4 MG/2ML IJ SOLN
INTRAMUSCULAR | Status: AC
  Administered 2021-01-11: 4 mg via INTRAVENOUS
  Filled 2021-01-11: qty 2

## 2021-01-11 MED ORDER — CIPROFLOXACIN IN D5W 400 MG/200ML IV SOLN
400.0000 mg | Freq: Once | INTRAVENOUS | Status: AC
  Administered 2021-01-11: 400 mg via INTRAVENOUS
  Filled 2021-01-11: qty 200

## 2021-01-11 MED ORDER — POTASSIUM CHLORIDE 10 MEQ/100ML IV SOLN: 10.0000 meq | INTRAVENOUS | Status: AC

## 2021-01-11 NOTE — H&P (Signed)
History and Physical    Kim Munoz YIF:[id number] DOB: June 13, 1956 DOA: 01/11/2021  Referring MD/NP/PA:   PCP: [facility]   Patient coming from:  The patient is coming from home.  At baseline, pt is independent for most of ADL.        Chief Complaint: Abdominal pain, nausea, vomiting, diarrhea  HPI: Kim Munoz is a 65 y.o. female with medical history significant of hypertension, tobacco abuse, who presents with nausea, vomiting, abdominal pain and diarreha  Patient states that her nausea, vomiting, abdominal pain have been going on for more than 4 days, which has been progressively worsening.  He has vomited 3 times today,  with nonbilious and nonbloody vomiting.  Her abdominal pain is located in the upper abdomen, constant, sharp, 10 out of 10 severity, nonradiating.  Patient states that at the beginning she had some diarrhea, which has improved, but still has diarrhea each time when she eats food.  No fever or chills.  Denies chest pain, cough, shortness of breath.  No symptoms of UTI.  No unilateral weakness.  Patient was seen in ED on 4/21 and had CT scan which showed possible Crohn's disease or infectious enteritis. Patient was discharged home with plan to follow-up with GI.  Patient stated her symptoms have been progressively worsening, therefore she comes back to hospital.  CT-abd/pelvis on 01/08/21: Mild dilatation and severe wall thickening and inflammation is seen involving the distal ileum concerning for Crohn's disease or some form of infectious enteritis. Small amount of free fluid is noted in the pelvis.  5 mm right lower lobe nodule is noted. No follow-up needed if patient is low-risk. Non-contrast chest CT can be considered in 12 months if patient is high-risk. This recommendation follows the consensus statement: Guidelines for Management of Incidental Pulmonary Nodules Detected on CT Images: From the Fleischner Society 2017; Radiology 2017;  284:228-243.  Moderate size fat containing periumbilical hernia is noted.  Aortic Atherosclerosis (ICD10-I70.0).   ED Course: pt was found to have WBC 5.0, pending COVID-19 PCR, potassium 3.2, renal function okay, temperature normal, blood pressure 193/109, 161/94, heart rate 70, RR 32, oxygen saturation 90-97% on room air.  Patient is admitted to MedSurg bed as inpatient.  Dr. Tobi Bastos of GI is consulted.  Review of Systems:   General: no fevers, chills, no body weight gain, has poor appetite, has fatigue HEENT: no blurry vision, hearing changes or sore throat Respiratory: no dyspnea, coughing, wheezing CV: no chest pain, no palpitations GI: has nausea, vomiting, abdominal pain, diarrhea, no constipation GU: no dysuria, burning on urination, increased urinary frequency, hematuria  Ext: no leg edema Neuro: no unilateral weakness, numbness, or tingling, no vision change or hearing loss Skin: no rash, no skin tear. MSK: No muscle spasm, no deformity, no limitation of range of movement in spin Heme: No easy bruising.  Travel history: No recent long distant travel.  Allergy: No Known Allergies  Past Medical History:  Diagnosis Date  . Complication of anesthesia    nausea after colonoscopy  . Family history of adverse reaction to anesthesia    sister - nausea aafter surgery  . Hypertension   . Wears dentures    full upper and lower    Past Surgical History:  Procedure Laterality Date  . ABDOMINAL HYSTERECTOMY    . COLONOSCOPY    . HALLUX VALGUS LAPIDUS Right 04/14/2016   Procedure: HALLUX VALGUS LAPIDUS CORRECTION RIGHT FOOT;  Surgeon: Gwyneth Revels, DPM;  Location: [facility];  Service:  Podiatry;  Laterality: Right;  WITH POPLITEAL BLOCK  . HALLUX VALGUS LAPIDUS Right 08/20/2016   Procedure: HALLUX VALGUS LAPIDUS;  Surgeon: Gwyneth RevelsJustin Fowler, DPM;  Location: [facility];  Service: Podiatry;  Laterality: Right;    Social History:  reports that she has been smoking cigarettes.  She has a 17.50 pack-year smoking history. She has never used smokeless tobacco. She reports current alcohol use. She reports that she does not use drugs.  Family History:  Family History  Problem Relation Age of Onset  . Kidney disease Mother   . Thyroid cancer Sister      Prior to Admission medications   Medication Sig Start Date End Date Taking? Authorizing Provider  albuterol (VENTOLIN HFA) 108 (90 Base) MCG/ACT inhaler Inhale 2-4 puffs by mouth every 4 hours as needed for wheezing, cough, and/or shortness of breath 03/24/20   Loleta RoseForbach, Cory, MD  amLODipine (NORVASC) 10 MG tablet Take 10 mg by mouth every morning.     [provider]  azithromycin (ZITHROMAX) 250 MG tablet Take 2 tablets PO on day 1, then take 1 tablet PO daily for 4 more days 03/24/20   Loleta RoseForbach, Cory, MD  benzonatate (TESSALON PERLES) 100 MG capsule Take 1 capsule (100 mg total) by mouth 3 (three) times daily as needed for cough. 03/24/20   Loleta RoseForbach, Cory, MD  cyclobenzaprine (FLEXERIL) 5 MG tablet Take 1 tablet (5 mg total) by mouth 3 (three) times daily as needed for muscle spasms. 09/25/17   Menshew, Charlesetta IvoryJenise V Bacon, PA-C  hydrochlorothiazide (HYDRODIURIL) 12.5 MG tablet Take 12.5 mg by mouth every morning.  04/09/16 04/09/17  [provider]  HYDROcodone-acetaminophen (NORCO) 5-325 MG tablet Take 1 tablet by mouth every 6 (six) hours as needed for moderate pain. 01/08/21   Minna AntisPaduchowski, Kevin, MD  ibuprofen (ADVIL,MOTRIN) 800 MG tablet Take 1 tablet (800 mg total) by mouth every 8 (eight) hours as needed for moderate pain. 01/09/17   Irean HongSung, Jade J, MD  ketorolac (TORADOL) 10 MG tablet Take 1 tablet (10 mg total) by mouth every 8 (eight) hours. 09/25/17   Menshew, Charlesetta IvoryJenise V Bacon, PA-C  methylPREDNISolone (MEDROL DOSEPAK) 4 MG TBPK tablet Take as directed 01/09/17   Irean HongSung, Jade J, MD  ondansetron (ZOFRAN) 4 MG tablet Take 1 tablet (4 mg total) by mouth every 8 (eight) hours as needed. 09/16/20   Phineas SemenGoodman, Graydon, MD   ondansetron (ZOFRAN) 4 MG tablet Take 1 tablet (4 mg total) by mouth daily as needed. 01/08/21 01/08/22  Minna AntisPaduchowski, Kevin, MD  oxyCODONE-acetaminophen (ROXICET) 5-325 MG tablet Take 1 tablet by mouth every 4 (four) hours as needed for severe pain. 01/09/17   Irean HongSung, Jade J, MD  predniSONE (STERAPRED UNI-PAK 21 TAB) 10 MG (21) TBPK tablet Per packaging instructions 09/16/20   Phineas SemenGoodman, Graydon, MD    Physical Exam: Vitals:   01/11/21 1630 01/11/21 1700 01/11/21 1709 01/11/21 1741  BP: (!) 168/92 (!) 169/87 (!) 158/94 (!) 153/83  Pulse: 66 65 73 74  Resp:    18  Temp:    98.7 F (37.1 C)  TempSrc:    Oral  SpO2: 90% 92% 94% 98%  Weight:      Height:       General: Not in acute distress HEENT:       Eyes: PERRL, EOMI, no scleral icterus.       ENT: No discharge from the ears and nose, no pharynx injection, no tonsillar enlargement.        Neck: No JVD, no bruit,  no mass felt. Heme: No neck lymph node enlargement. Cardiac: S1/S2, RRR, No murmurs, No gallops or rubs. Respiratory: No rales, wheezing, rhonchi or rubs. GI: Soft, nondistended, has tenderness in the upper abdomen, no rebound pain, no organomegaly, BS present. GU: No hematuria Ext: No pitting leg edema bilaterally. 1+DP/PT pulse bilaterally. Musculoskeletal: No joint deformities, No joint redness or warmth, no limitation of ROM in spin. Skin: No rashes.  Neuro: Alert, oriented X3, cranial nerves II-XII grossly intact, moves all extremities normally. Psych: Patient is not psychotic, no suicidal or hemocidal ideation.  Labs on Admission: I have personally reviewed following labs and imaging studies  CBC: Recent Labs  Lab 01/08/21 1330 01/11/21 1054  WBC 7.4 5.0  HGB 14.1 13.8  HCT 41.6 39.8  MCV 81.9 81.1  PLT 312 282   Basic Metabolic Panel: Recent Labs  Lab 01/08/21 1330 01/11/21 1054  NA 140 137  K 3.7 3.2*  CL 104 102  CO2 26 25  GLUCOSE 108* 111*  BUN 11 11  CREATININE 1.09* 0.81  CALCIUM 9.7 9.3    GFR: Estimated Creatinine Clearance: 70.4 mL/min (by C-G formula based on SCr of 0.81 mg/dL). Liver Function Tests: Recent Labs  Lab 01/08/21 1330 01/11/21 1054  AST 12* 15  ALT 11 12  ALKPHOS 24* 25*  BILITOT 1.3* 1.3*  PROT 7.3 7.2  ALBUMIN 3.8 3.7   Recent Labs  Lab 01/08/21 1330 01/11/21 1054  LIPASE 33 36   No results for input(s): AMMONIA in the last 168 hours. Coagulation Profile: No results for input(s): INR, PROTIME in the last 168 hours. Cardiac Enzymes: No results for input(s): CKTOTAL, CKMB, CKMBINDEX, TROPONINI in the last 168 hours. BNP (last 3 results) No results for input(s): PROBNP in the last 8760 hours. HbA1C: No results for input(s): HGBA1C in the last 72 hours. CBG: No results for input(s): GLUCAP in the last 168 hours. Lipid Profile: No results for input(s): CHOL, HDL, LDLCALC, TRIG, CHOLHDL, LDLDIRECT in the last 72 hours. Thyroid Function Tests: No results for input(s): TSH, T4TOTAL, FREET4, T3FREE, THYROIDAB in the last 72 hours. Anemia Panel: No results for input(s): VITAMINB12, FOLATE, FERRITIN, TIBC, IRON, RETICCTPCT in the last 72 hours. Urine analysis:    Component Value Date/Time   COLORURINE YELLOW (A) 01/11/2021 1425   APPEARANCEUR CLEAR (A) 01/11/2021 1425   LABSPEC 1.011 01/11/2021 1425   PHURINE 6.0 01/11/2021 1425   GLUCOSEU NEGATIVE 01/11/2021 1425   HGBUR SMALL (A) 01/11/2021 1425   BILIRUBINUR NEGATIVE 01/11/2021 1425   KETONESUR 20 (A) 01/11/2021 1425   PROTEINUR NEGATIVE 01/11/2021 1425   NITRITE NEGATIVE 01/11/2021 1425   LEUKOCYTESUR NEGATIVE 01/11/2021 1425   Sepsis Labs: @LABRCNTIP (procalcitonin:4,lacticidven:4) ) Recent Results (from the past 240 hour(s))  Resp Panel by RT-PCR (Flu A&B, Covid) Nasopharyngeal Swab     Status: None   Collection Time: 01/11/21  3:15 PM   Specimen: Nasopharyngeal Swab; Nasopharyngeal(NP) swabs in vial transport medium  Result Value Ref Range Status   SARS Coronavirus 2 by RT  PCR NEGATIVE NEGATIVE Final    Comment: (NOTE) SARS-CoV-2 target nucleic acids are NOT DETECTED.  The SARS-CoV-2 RNA is generally detectable in upper respiratory specimens during the acute phase of infection. The lowest concentration of SARS-CoV-2 viral copies this assay can detect is 138 copies/mL. A negative result does not preclude SARS-Cov-2 infection and should not be used as the sole basis for treatment or other patient management decisions. A negative result may occur with  improper specimen collection/handling, submission of  specimen other than nasopharyngeal swab, presence of viral mutation(s) within the areas targeted by this assay, and inadequate number of viral copies(<138 copies/mL). A negative result must be combined with clinical observations, patient history, and epidemiological information. The expected result is Negative.  Fact Sheet for Patients:  BloggerCourse.com  Fact Sheet for Healthcare Providers:  SeriousBroker.it  This test is no t yet approved or cleared by the [place] FDA and  has been authorized for detection and/or diagnosis of SARS-CoV-2 by FDA under an Emergency Use Authorization (EUA). This EUA will remain  in effect (meaning this test can be used) for the duration of the COVID-19 declaration under Section 564(b)(1) of the Act, 21 U.S.C.section 360bbb-3(b)(1), unless the authorization is terminated  or revoked sooner.       Influenza A by PCR NEGATIVE NEGATIVE Final   Influenza B by PCR NEGATIVE NEGATIVE Final    Comment: (NOTE) The Xpert Xpress SARS-CoV-2/FLU/RSV plus assay is intended as an aid in the diagnosis of influenza from Nasopharyngeal swab specimens and should not be used as a sole basis for treatment. Nasal washings and aspirates are unacceptable for Xpert Xpress SARS-CoV-2/FLU/RSV testing.  Fact Sheet for Patients: BloggerCourse.com  Fact Sheet for  Healthcare Providers: SeriousBroker.it  This test is not yet approved or cleared by the [place] FDA and has been authorized for detection and/or diagnosis of SARS-CoV-2 by FDA under an Emergency Use Authorization (EUA). This EUA will remain in effect (meaning this test can be used) for the duration of the COVID-19 declaration under Section 564(b)(1) of the Act, 21 U.S.C. section 360bbb-3(b)(1), unless the authorization is terminated or revoked.  Performed at [facility], [address] [place], [place] [address]      Radiological Exams on Admission: No results found.   EKG: I have personally reviewed.  Sinus rhythm, QTC 489, nonspecific T wave change  Assessment/Plan Principal Problem:   Abdominal pain Active Problems:   Hypertension   Lung nodule   Tobacco abuse   Hypokalemia   Abdominal pain: Etiology is not clear.  CT scan on 4/21 showed mild dilatation and severe wall thickening and inflammation is seen involving the distal ileum concerning for Crohn's disease or some form of infectious enteritis.  Today on physical examination, patient abdomen is tender, but soft, no guarding, very low suspicions for perforation.  Will not repeat imaging.  Dr. Tobi Bastos of GI is consulted  -Admitted to MedSurg bed as inpatient -Started Zosyn (patient received 1 dose of Cipro and Flagyl in ED) -Patient was given 2 dose of Solu-Medrol in ED, 125 in morning and 80 mg in the afternoon in ED -As needed morphine and Percocet for pain -Follow-up of blood culture  -IVF: 1L of LR, then 100 cc/h -f/u C. difficile test  Hypertension -IV hydralazine as needed -Continue home amlodipine -hold HCTZ since patient need IV fluid  Lung nodule: This is an incidental finding on CT scan -Follow-up with PCP  Tobacco abuse -Nicotine patch  Hypokalemia -Replete potassium -Check magnesium level    DVT ppx: SQ Lovenox Code Status: Full code Family  Communication: not done, no family member is at bed side.    Disposition Plan:  Anticipate discharge back to previous environment Consults called:  Dr. Tobi Bastos of GI Admission status and Level of care: Med-Surg:  as inpt      Status is: Inpatient  Remains inpatient appropriate because:Inpatient level of care appropriate due to severity of illness   Dispo: The patient is from: Home  Anticipated d/c is to: Home              Patient currently is not medically stable to d/c.   Difficult to place patient No         Date of Service 01/11/2021    Lorretta Harp [facility]   If 7PM-7AM, please contact night-coverage [email or web address] 01/11/2021, 7:59 PM

## 2021-01-11 NOTE — Progress Notes (Signed)
Pt arrival to unit. VSS. LR infusing. Whiteboard updated. Admission assessment complete. Pt oriented to room and unit.

## 2021-01-11 NOTE — ED Triage Notes (Signed)
Pt c/o constant sharp abdominal pain with N/V since Thursday 01/08/21. Was seen Thursday for same

## 2021-01-11 NOTE — Progress Notes (Signed)
Pharmacy Antibiotic Note  Kim Munoz is a 65 y.o. female with a past history of hypertension who came to the ED complaining of generalized abdominal pain associated nausea and vomiting which has been going on for the past 3 days.  Was seen here 3 days ago for the same, had labs and CT scan which showed inflammation in the distal ileum, concerning for Crohn's. Was planned for follow-up with GI. Pharmacy has been consulted for Zosyn dosing for possible Crohn's disease, nausea, vomiting, abdominal pain.  4/21 CT abdomen: Mild dilatation and severe wall thickening and inflammation is seen involving the distal ileum concerning for Crohn's disease or some form of infectious enteritis. Small amount of free fluid is noted in the pelvis  Plan: Zosyn 3.375g IV q8h (4 hour infusion).   Monitor renal function and adjust antibiotics as clinically indicated  Height: 5\' 1"  (154.9 cm) Weight: 83 kg (183 lb) IBW/kg (Calculated) : 47.8  Temp (24hrs), Avg:98.2 F (36.8 C), Min:98.2 F (36.8 C), Max:98.2 F (36.8 C)  Recent Labs  Lab 01/08/21 1330 01/11/21 1054  WBC 7.4 5.0  CREATININE 1.09* 0.81    Estimated Creatinine Clearance: 70.4 mL/min (by C-G formula based on SCr of 0.81 mg/dL).    No Known Allergies  Antimicrobials this admission: 4/24 cipro and metronidazole x1 4/24 Zosyn >>  Dose adjustments this admission:   Microbiology results: 4/24 BCx: sent  Thank you for allowing pharmacy to be a part of this patient's care.  5/24, PharmD Pharmacy Resident  01/11/2021 3:27 PM

## 2021-01-11 NOTE — ED Provider Notes (Addendum)
[facility] Emergency Department Provider Note  ____________________________________________  Time seen: Approximately 3:04 PM  I have reviewed the triage vital signs and the nursing notes.   HISTORY  Chief Complaint Abdominal Pain, Nausea, and Vomiting    HPI Kim Munoz is a 65 y.o. female with a past history of hypertension who comes ED complaining of generalized abdominal pain that is sharp and severe, 10/10, nonradiating, constant, waxing and waning, no aggravating or alleviating factors and associated nausea and vomiting is been going on for the past 3 days.  Was seen here 3 days ago for the same, had labs and CT scan which showed inflammation in the distal ileum, concerning for Crohn's.  Was planned for follow-up with GI.  Has taken some Percocet, no antibiotics or steroid use so far.  Pain is continue to worsen, she is not able to eat or keep her self hydrated or manage the pain.      Past Medical History:  Diagnosis Date  . Complication of anesthesia    nausea after colonoscopy  . Family history of adverse reaction to anesthesia    sister - nausea aafter surgery  . Hypertension   . Wears dentures    full upper and lower     Patient Active Problem List   Diagnosis Date Noted  . Intractable nausea and vomiting 06/12/2016     Past Surgical History:  Procedure Laterality Date  . ABDOMINAL HYSTERECTOMY    . COLONOSCOPY    . HALLUX VALGUS LAPIDUS Right 04/14/2016   Procedure: HALLUX VALGUS LAPIDUS CORRECTION RIGHT FOOT;  Surgeon: Gwyneth Revels, DPM;  Location: [facility];  Service: Podiatry;  Laterality: Right;  WITH POPLITEAL BLOCK  . HALLUX VALGUS LAPIDUS Right 08/20/2016   Procedure: HALLUX VALGUS LAPIDUS;  Surgeon: Gwyneth Revels, DPM;  Location: [facility];  Service: Podiatry;  Laterality: Right;     Prior to Admission medications   Medication Sig Start Date End Date Taking? Authorizing Provider  albuterol (VENTOLIN HFA) 108  (90 Base) MCG/ACT inhaler Inhale 2-4 puffs by mouth every 4 hours as needed for wheezing, cough, and/or shortness of breath 03/24/20   Loleta Rose, MD  amLODipine (NORVASC) 10 MG tablet Take 10 mg by mouth every morning.     [provider]  azithromycin (ZITHROMAX) 250 MG tablet Take 2 tablets PO on day 1, then take 1 tablet PO daily for 4 more days 03/24/20   Loleta Rose, MD  benzonatate (TESSALON PERLES) 100 MG capsule Take 1 capsule (100 mg total) by mouth 3 (three) times daily as needed for cough. 03/24/20   Loleta Rose, MD  cyclobenzaprine (FLEXERIL) 5 MG tablet Take 1 tablet (5 mg total) by mouth 3 (three) times daily as needed for muscle spasms. 09/25/17   Menshew, Charlesetta Ivory, PA-C  hydrochlorothiazide (HYDRODIURIL) 12.5 MG tablet Take 12.5 mg by mouth every morning.  04/09/16 04/09/17  [provider]  HYDROcodone-acetaminophen (NORCO) 5-325 MG tablet Take 1 tablet by mouth every 6 (six) hours as needed for moderate pain. 01/08/21   Minna Antis, MD  ibuprofen (ADVIL,MOTRIN) 800 MG tablet Take 1 tablet (800 mg total) by mouth every 8 (eight) hours as needed for moderate pain. 01/09/17   Irean Hong, MD  ketorolac (TORADOL) 10 MG tablet Take 1 tablet (10 mg total) by mouth every 8 (eight) hours. 09/25/17   Menshew, Charlesetta Ivory, PA-C  methylPREDNISolone (MEDROL DOSEPAK) 4 MG TBPK tablet Take as directed 01/09/17   Irean Hong, MD  ondansetron (ZOFRAN) 4 MG tablet Take 1 tablet (4 mg total) by mouth every 8 (eight) hours as needed. 09/16/20   Phineas Semen, MD  ondansetron (ZOFRAN) 4 MG tablet Take 1 tablet (4 mg total) by mouth daily as needed. 01/08/21 01/08/22  Minna Antis, MD  oxyCODONE-acetaminophen (ROXICET) 5-325 MG tablet Take 1 tablet by mouth every 4 (four) hours as needed for severe pain. 01/09/17   Irean Hong, MD  predniSONE (STERAPRED UNI-PAK 21 TAB) 10 MG (21) TBPK tablet Per packaging instructions 09/16/20   Phineas Semen, MD      Allergies Patient has no known allergies.   History reviewed. No pertinent family history.  Social History Social History   Tobacco Use  . Smoking status: Current Every Day Smoker    Packs/day: 0.50    Years: 35.00    Pack years: 17.50    Types: Cigarettes  . Smokeless tobacco: Never Used  Substance Use Topics  . Alcohol use: Yes    Comment: 2 drinks/mo  . Drug use: Never    Review of Systems  Constitutional:   No fever or chills.  ENT:   No sore throat. No rhinorrhea. Cardiovascular:   No chest pain or syncope. Respiratory:   No dyspnea or cough. Gastrointestinal:   Positive as above for abdominal pain. Musculoskeletal:   Negative for focal pain or swelling All other systems reviewed and are negative except as documented above in ROS and HPI.  ____________________________________________   PHYSICAL EXAM:  VITAL SIGNS: ED Triage Vitals  Enc Vitals Group     BP 01/11/21 1020 (!) 193/109     Pulse Rate 01/11/21 1020 77     Resp 01/11/21 1020 (!) 32     Temp 01/11/21 1020 98.2 F (36.8 C)     Temp Source 01/11/21 1020 Oral     SpO2 01/11/21 1020 97 %     Weight 01/11/21 1021 183 lb (83 kg)     Height 01/11/21 1021 5\' 1"  (1.549 m)     Head Circumference --      Peak Flow --      Pain Score 01/11/21 1021 10     Pain Loc --      Pain Edu? --      Excl. in GC? --     Vital signs reviewed, nursing assessments reviewed.   Constitutional:   Alert and oriented. Non-toxic appearance. Eyes:   Conjunctivae are normal. EOMI. PERRL. ENT      Head:   Normocephalic and atraumatic.      Nose:   Wearing a mask.      Mouth/Throat:   Wearing a mask.      Neck:   No meningismus. Full ROM. Hematological/Lymphatic/Immunilogical:   No cervical lymphadenopathy. Cardiovascular:   RRR. Symmetric bilateral radial and DP pulses.  No murmurs. Cap refill less than 2 seconds. Respiratory:   Normal respiratory effort without tachypnea/retractions. Breath sounds are clear and  equal bilaterally. No wheezes/rales/rhonchi. Gastrointestinal:   Soft with mild generalized tenderness, nonfocal. Non distended. There is no CVA tenderness.  No rebound, rigidity, or guarding. Genitourinary:   deferred Musculoskeletal:   Normal range of motion in all extremities. No joint effusions.  No lower extremity tenderness.  No edema. Neurologic:   Normal speech and language.  Motor grossly intact. No acute focal neurologic deficits are appreciated.  Skin:    Skin is warm, dry and intact. No rash noted.  No petechiae, purpura, or bullae.  ____________________________________________    LABS (  pertinent positives/negatives) (all labs ordered are listed, but only abnormal results are displayed) Labs Reviewed  COMPREHENSIVE METABOLIC PANEL - Abnormal; Notable for the following components:      Result Value   Potassium 3.2 (*)    Glucose, Bld 111 (*)    Alkaline Phosphatase 25 (*)    Total Bilirubin 1.3 (*)    All other components within normal limits  URINALYSIS, COMPLETE (UACMP) WITH MICROSCOPIC - Abnormal; Notable for the following components:   Color, Urine YELLOW (*)    APPearance CLEAR (*)    Hgb urine dipstick SMALL (*)    Ketones, ur 20 (*)    Bacteria, UA RARE (*)    All other components within normal limits  RESP PANEL BY RT-PCR (FLU A&B, COVID) ARPGX2  LIPASE, BLOOD  CBC   ____________________________________________   EKG  Interpreted by me Normal sinus rhythm rate of 83, normal axis and intervals.  Poor R wave progression.  Normal ST segments and T waves.  No ischemic changes  ____________________________________________    RADIOLOGY  No results found.  ____________________________________________   PROCEDURES Procedures  ____________________________________________    CLINICAL IMPRESSION / ASSESSMENT AND PLAN / ED COURSE  Medications ordered in the ED: Medications  ciprofloxacin (CIPRO) IVPB 400 mg (has no administration in time range)   methylPREDNISolone sodium succinate (SOLU-MEDROL) 125 mg/2 mL injection 80 mg (has no administration in time range)  dextrose 5 % and 0.9% NaCl 5-0.9 % bolus 1,000 mL (has no administration in time range)  morphine 4 MG/ML injection 4 mg (has no administration in time range)  lactated ringers bolus 1,000 mL (0 mLs Intravenous Stopped 01/11/21 1320)  ondansetron (ZOFRAN) injection 4 mg (4 mg Intravenous Given 01/11/21 1158)  HYDROmorphone (DILAUDID) injection 1 mg (1 mg Intravenous Given 01/11/21 1157)  methylPREDNISolone sodium succinate (SOLU-MEDROL) 125 mg/2 mL injection 125 mg (125 mg Intravenous Given 01/11/21 1158)  metroNIDAZOLE (FLAGYL) IVPB 500 mg (0 mg Intravenous Stopped 01/11/21 1259)  ondansetron (ZOFRAN) injection 4 mg (4 mg Intravenous Given 01/11/21 1424)    Pertinent labs & imaging results that were available during my care of the patient were reviewed by me and considered in my medical decision making (see chart for details).  Kim Ishikawaamela Munoz was evaluated in Emergency Department on 01/11/2021 for the symptoms described in the history of present illness. She was evaluated in the context of the global COVID-19 pandemic, which necessitated consideration that the patient might be at risk for infection with the SARS-CoV-2 virus that causes COVID-19. Institutional protocols and algorithms that pertain to the evaluation of patients at risk for COVID-19 are in a state of rapid change based on information released by regulatory bodies including the CDC and federal and state organizations. These policies and algorithms were followed during the patient's care in the ED.   Patient presents with severe abdominal pain.  Nonfocal on exam.  CT from 3 days ago reviewed, concerning for Crohn's, negative for appendicitis or bowel obstruction or surgical pathology.  Doubt perforation or intra-abdominal abscess.  Vital signs unremarkable except for hypertension which is likely due to pain and underlying  essential hypertension, patient's not septic.  Patient given IV fluids, antiemetics, IV opioid pain medication without improvement in symptoms.  We will need to continue hydration, pain medicine and antiemetics for symptom relief.  We will plan to hospitalize for further management.  Patient ordered a dose of Cipro and Flagyl for antibiotic coverage, dose of steroids to help with ileum inflammation as the likely source  of her pain.      ____________________________________________   FINAL CLINICAL IMPRESSION(S) / ED DIAGNOSES    Final diagnoses:  Intractable abdominal pain  Intractable nausea and vomiting     ED Discharge Orders    None      Portions of this note were generated with dragon dictation software. Dictation errors may occur despite best attempts at proofreading.   Sharman Cheek, MD 01/11/21 1507    Sharman Cheek, MD 01/23/21 [phone]

## 2021-01-12 DIAGNOSIS — R109 Unspecified abdominal pain: Secondary | ICD-10-CM | POA: Diagnosis not present

## 2021-01-12 DIAGNOSIS — R112 Nausea with vomiting, unspecified: Secondary | ICD-10-CM | POA: Diagnosis not present

## 2021-01-12 DIAGNOSIS — E876 Hypokalemia: Secondary | ICD-10-CM

## 2021-01-12 DIAGNOSIS — R911 Solitary pulmonary nodule: Secondary | ICD-10-CM

## 2021-01-12 DIAGNOSIS — K529 Noninfective gastroenteritis and colitis, unspecified: Secondary | ICD-10-CM

## 2021-01-12 LAB — CBC
HCT: 38.8 % (ref 36.0–46.0)
Hemoglobin: 13.6 g/dL (ref 12.0–15.0)
MCH: 28.1 pg (ref 26.0–34.0)
MCHC: 35.1 g/dL (ref 30.0–36.0)
MCV: 80.2 fL (ref 80.0–100.0)
Platelets: 272 10*3/uL (ref 150–400)
RBC: 4.84 MIL/uL (ref 3.87–5.11)
RDW: 13.4 % (ref 11.5–15.5)
WBC: 8.4 10*3/uL (ref 4.0–10.5)
nRBC: 0 % (ref 0.0–0.2)

## 2021-01-12 LAB — BASIC METABOLIC PANEL
Anion gap: 10 (ref 5–15)
BUN: 11 mg/dL (ref 8–23)
CO2: 22 mmol/L (ref 22–32)
Calcium: 9.2 mg/dL (ref 8.9–10.3)
Chloride: 104 mmol/L (ref 98–111)
Creatinine, Ser: 0.96 mg/dL (ref 0.44–1.00)
GFR, Estimated: >60 mL/min (ref >60)
Glucose, Bld: 123 mg/dL — ABNORMAL HIGH (ref 70–99)
Potassium: 3.8 mmol/L (ref 3.5–5.1)
Sodium: 136 mmol/L (ref 135–145)

## 2021-01-12 LAB — MAGNESIUM: Magnesium: 1.7 mg/dL (ref 1.7–2.4)

## 2021-01-12 LAB — GLUCOSE, CAPILLARY: Glucose-Capillary: 122 mg/dL — ABNORMAL HIGH (ref 70–99)

## 2021-01-12 LAB — HIV ANTIBODY (ROUTINE TESTING W REFLEX): HIV Screen 4th Generation wRfx: NONREACTIVE

## 2021-01-12 MED ORDER — SODIUM CHLORIDE 0.9 % IV SOLN
INTRAVENOUS | Status: DC
  Administered 2021-01-13: 20 mL via INTRAVENOUS

## 2021-01-12 MED ORDER — POLYETHYLENE GLYCOL 3350 17 GM/SCOOP PO POWD
1.0000 | Freq: Once | ORAL | Status: AC
  Administered 2021-01-12: 255 g via ORAL
  Filled 2021-01-12: qty 255

## 2021-01-12 MED ORDER — MAGNESIUM CITRATE PO SOLN
1.0000 | Freq: Once | ORAL | Status: AC
  Administered 2021-01-12: 1 via ORAL
  Filled 2021-01-12: qty 296

## 2021-01-12 NOTE — Progress Notes (Signed)
[place] at [facility]   PATIENT NAME: Kim Munoz    MR#:  [id number]  DATE OF BIRTH:  10-20-1955  SUBJECTIVE:  CHIEF COMPLAINT:   Chief Complaint  Patient presents with  . Abdominal Pain  . Nausea  . Vomiting  still feeling Nauseous, no vomiting/diarrhea yet. Husband at bedside REVIEW OF SYSTEMS:  Review of Systems  Constitutional: Negative for diaphoresis, fever, malaise/fatigue and weight loss.  HENT: Negative for ear discharge, ear pain, hearing loss, nosebleeds, sore throat and tinnitus.   Eyes: Negative for blurred vision and pain.  Respiratory: Negative for cough, hemoptysis, shortness of breath and wheezing.   Cardiovascular: Negative for chest pain, palpitations, orthopnea and leg swelling.  Gastrointestinal: Positive for abdominal pain, diarrhea, nausea and vomiting. Negative for blood in stool, constipation and heartburn.  Genitourinary: Negative for dysuria, frequency and urgency.  Musculoskeletal: Negative for back pain and myalgias.  Skin: Negative for itching and rash.  Neurological: Negative for dizziness, tingling, tremors, focal weakness, seizures, weakness and headaches.  Psychiatric/Behavioral: Negative for depression. The patient is not nervous/anxious.    DRUG ALLERGIES:  No Known Allergies VITALS:  Blood pressure (!) 142/86, pulse 65, temperature 98.4 F (36.9 C), temperature source Oral, resp. rate 18, height 5\' 1"  (1.549 m), weight 83 kg, SpO2 100 %. PHYSICAL EXAMINATION:  Physical Exam  65 y f lying in bed comfortably without any acute distress Lungs: CTA b/l, no wheezing, rales Cardio: s1, s2 normal, no M/R/G Abd: soft, no distention, periumbilical tenderness, no guarding/rigidity Neuro: alert, oriented, nonfocal exam Psych: normal mood and affect LABORATORY PANEL:  Female CBC Recent Labs  Lab 01/12/21 0443  WBC 8.4  HGB 13.6  HCT 38.8  PLT 272    ------------------------------------------------------------------------------------------------------------------ Chemistries  Recent Labs  Lab 01/11/21 1054 01/12/21 0443  NA 137 136  K 3.2* 3.8  CL 102 104  CO2 25 22  GLUCOSE 111* 123*  BUN 11 11  CREATININE 0.81 0.96  CALCIUM 9.3 9.2  MG  --  1.7  AST 15  --   ALT 12  --   ALKPHOS 25*  --   BILITOT 1.3*  --    RADIOLOGY:  No results found. ASSESSMENT AND PLAN:  65 y.o. female with medical history significant of hypertension, tobacco abuse, who presents with nausea, vomiting, abdominal pain and diarrhea  Abdominal pain: Etiology is not clear.  CT scan on 4/21 concerning for Crohn's disease or some form of infectious enteritis. GI is consulted - pending  - continue Zosyn (patient received 1 dose of Cipro and Flagyl in ED) -As needed morphine and Percocet for pain -neg blood culture till now -she has no stool yet so no testing sent  Hypertension -IV hydralazine as needed -Continue home amlodipine -hold HCTZ since patient need IV fluid  Lung nodule: This is an incidental finding on CT scan -Follow-up with PCP  Tobacco abuse -Nicotine patch  Hypokalemia -Replete and recheck -Check magnesium level  Obesity Body mass index is 34.58 kg/m.  Net IO Since Admission: 2,285.55 mL [01/12/21 1900]      Status is: Inpatient  Remains inpatient appropriate because:Ongoing diagnostic testing needed not appropriate for outpatient work up   Dispo: The patient is from: Home              Anticipated d/c is to: Home              Patient currently is not medically stable to d/c.   Difficult to place patient No  DVT prophylaxis:       enoxaparin (LOVENOX) injection 40 mg Start: 01/11/21 2200     Family Communication: updated husband at bedside   All the records are reviewed and case discussed with Care Management/Social Worker. Management plans discussed with the patient, family and they are in  agreement.  CODE STATUS: Full Code Level of care: Med-Surg  TOTAL TIME TAKING CARE OF THIS PATIENT: 35 minutes.   More than 50% of the time was spent in counseling/coordination of care: YES  POSSIBLE D/C IN 2-3 DAYS, DEPENDING ON CLINICAL CONDITION.   Delfino Lovett M.D on 01/12/2021 at 7:00 PM  [facility]   CC: Primary care physician; [facility]  Note: This dictation was prepared with Dragon dictation along with smaller phrase technology. Any transcriptional errors that result from this process are unintentional.

## 2021-01-12 NOTE — Consult Note (Addendum)
Cephas Darby, MD [address]  [place]  [place], [place] [address]  Main: [phone]  Fax: [phone] Pager: [phone]   Consultation  Referring Provider:     No ref. provider found Primary Care Physician:  [place] Primary Gastroenterologist: Althia Forts         Reason for Consultation:     Abnormal CT, enteritis  Date of Admission:  01/11/2021 Date of Consultation:  01/12/2021         HPI:   Kim Munoz is a 65 y.o. female presented yesterday with 3 days history of generalized abdominal pain associated with nausea and vomiting.  She was in the ER on 4/21 with same symptoms, she had CT scan which revealed inflammation in the distal ileum.  She was discharged home on Percocet and Zofran with plan for close follow-up with GI.  However, due to worsening of symptoms, patient presented to ER again yesterday.  Apparently, patient reports that she has been experiencing recurrence of lower abdominal pain associated with nausea intermittently for last 2 years.  Labs revealed no evidence of leukocytosis, mildly elevated T bili.  HIV negative.  C. difficile panel pending Patient is started on Zosyn and IV Solu-Medrol for possible Crohn's disease of terminal ileum.  Today, she reports that she is feeling better, passing gas, did not have a BM.  Able to tolerate clear liquids  She does smoke tobacco, denies alcohol use  NSAIDs: None  Antiplts/Anticoagulants/Anti thrombotics: None  GI Procedures: Reports undergoing colonoscopy more than 10 years ago  Past Medical History:  Diagnosis Date  . Complication of anesthesia    nausea after colonoscopy  . Family history of adverse reaction to anesthesia    sister - nausea aafter surgery  . Hypertension   . Wears dentures    full upper and lower    Past Surgical History:  Procedure Laterality Date  . ABDOMINAL HYSTERECTOMY    . COLONOSCOPY    . HALLUX VALGUS LAPIDUS Right 04/14/2016   Procedure: HALLUX  VALGUS LAPIDUS CORRECTION RIGHT FOOT;  Surgeon: Samara Deist, DPM;  Location: [facility];  Service: Podiatry;  Laterality: Right;  WITH POPLITEAL BLOCK  . HALLUX VALGUS LAPIDUS Right 08/20/2016   Procedure: HALLUX VALGUS LAPIDUS;  Surgeon: Samara Deist, DPM;  Location: [facility];  Service: Podiatry;  Laterality: Right;    Prior to Admission medications   Medication Sig Start Date End Date Taking? Authorizing Provider  amLODipine (NORVASC) 10 MG tablet Take 10 mg by mouth every morning.    Yes [provider]  fexofenadine (ALLEGRA) 180 MG tablet Take 1 tablet by mouth daily as needed. 07/07/20 07/07/21 Yes [provider]  HYDROcodone-acetaminophen (NORCO) 5-325 MG tablet Take 1 tablet by mouth every 6 (six) hours as needed for moderate pain. 01/08/21  Yes Harvest Dark, MD  albuterol (VENTOLIN HFA) 108 (90 Base) MCG/ACT inhaler Inhale 2-4 puffs by mouth every 4 hours as needed for wheezing, cough, and/or shortness of breath 03/24/20   Hinda Kehr, MD  azithromycin (ZITHROMAX) 250 MG tablet Take 2 tablets PO on day 1, then take 1 tablet PO daily for 4 more days Patient not taking: Reported on 01/11/2021 03/24/20   Hinda Kehr, MD  benzonatate (TESSALON PERLES) 100 MG capsule Take 1 capsule (100 mg total) by mouth 3 (three) times daily as needed for cough. Patient not taking: No sig reported 03/24/20   Hinda Kehr, MD  cyclobenzaprine (FLEXERIL) 5 MG tablet Take 1 tablet (5 mg  total) by mouth 3 (three) times daily as needed for muscle spasms. Patient not taking: No sig reported 09/25/17   Menshew, Dannielle Karvonen, PA-C  hydrochlorothiazide (HYDRODIURIL) 12.5 MG tablet Take 12.5 mg by mouth every morning.  04/09/16 04/09/17  [provider]  ibuprofen (ADVIL,MOTRIN) 800 MG tablet Take 1 tablet (800 mg total) by mouth every 8 (eight) hours as needed for moderate pain. Patient not taking: No sig reported 01/09/17   Paulette Blanch, MD  ketorolac (TORADOL) 10 MG tablet  Take 1 tablet (10 mg total) by mouth every 8 (eight) hours. Patient not taking: No sig reported 09/25/17   Menshew, Dannielle Karvonen, PA-C  methylPREDNISolone (MEDROL DOSEPAK) 4 MG TBPK tablet Take as directed Patient not taking: Reported on 01/11/2021 01/09/17   Paulette Blanch, MD  ondansetron (ZOFRAN) 4 MG tablet Take 1 tablet (4 mg total) by mouth every 8 (eight) hours as needed. Patient not taking: No sig reported 09/16/20   Nance Pear, MD  ondansetron (ZOFRAN) 4 MG tablet Take 1 tablet (4 mg total) by mouth daily as needed. 01/08/21 01/08/22  Harvest Dark, MD  oxyCODONE-acetaminophen (ROXICET) 5-325 MG tablet Take 1 tablet by mouth every 4 (four) hours as needed for severe pain. Patient not taking: No sig reported 01/09/17   Paulette Blanch, MD  predniSONE (STERAPRED UNI-PAK 21 TAB) 10 MG (21) TBPK tablet Per packaging instructions Patient not taking: No sig reported 09/16/20   Nance Pear, MD    Current Facility-Administered Medications:  .  acetaminophen (TYLENOL) tablet 650 mg, 650 mg, Oral, Q6H PRN, Ivor Costa, MD, 650 mg at 01/12/21 1335 .  albuterol (VENTOLIN HFA) 108 (90 Base) MCG/ACT inhaler 2 puff, 2 puff, Inhalation, Q4H PRN, Ivor Costa, MD .  amLODipine (NORVASC) tablet 10 mg, 10 mg, Oral, Daily, Ivor Costa, MD, 10 mg at 01/12/21 0741 .  enoxaparin (LOVENOX) injection 40 mg, 40 mg, Subcutaneous, Q24H, Ivor Costa, MD, 40 mg at 01/11/21 2207 .  hydrALAZINE (APRESOLINE) injection 5 mg, 5 mg, Intravenous, Q2H PRN, Ivor Costa, MD, 5 mg at 01/11/21 1658 .  lactated ringers infusion, , Intravenous, Continuous, Ivor Costa, MD, Last Rate: 100 mL/hr at 01/12/21 0228, Infusion Verify at 01/12/21 0228 .  loratadine (CLARITIN) tablet 10 mg, 10 mg, Oral, Daily PRN, Ivor Costa, MD .  morphine 2 MG/ML injection 1 mg, 1 mg, Intravenous, Q3H PRN, Ivor Costa, MD, 1 mg at 01/11/21 2037 .  nicotine (NICODERM CQ - dosed in mg/24 hours) patch 21 mg, 21 mg, Transdermal, Daily, Ivor Costa, MD .   ondansetron [facility]) injection 4 mg, 4 mg, Intravenous, Q8H PRN, Ivor Costa, MD, 4 mg at 01/11/21 2037 .  oxyCODONE-acetaminophen (PERCOCET/ROXICET) 5-325 MG per tablet 1 tablet, 1 tablet, Oral, Q4H PRN, Ivor Costa, MD .  piperacillin-tazobactam (ZOSYN) IVPB 3.375 g, 3.375 g, Intravenous, Q8H, Ivor Costa, MD, Last Rate: 12.5 mL/hr at 01/12/21 1149, 3.375 g at 01/12/21 1149  Family History  Problem Relation Age of Onset  . Kidney disease Mother   . Thyroid cancer Sister      Social History   Tobacco Use  . Smoking status: Current Every Day Smoker    Packs/day: 0.50    Years: 35.00    Pack years: 17.50    Types: Cigarettes  . Smokeless tobacco: Never Used  Substance Use Topics  . Alcohol use: Yes    Comment: 2 drinks/mo  . Drug use: Never    Allergies as of 01/11/2021  . (No Known Allergies)  Review of Systems:    All systems reviewed and negative except where noted in HPI.   Physical Exam:  Vital signs in last 24 hours: Temp:  [98.1 F (36.7 C)-98.4 F (36.9 C)] 98.4 F (36.9 C) (04/25 1654) Pulse Rate:  [65-76] 65 (04/25 1654) Resp:  [16-18] 18 (04/25 1654) BP: (139-159)/(72-86) 142/86 (04/25 1654) SpO2:  [99 %-100 %] 100 % (04/25 1654)   General:   Pleasant, cooperative in NAD Head:  Normocephalic and atraumatic. Eyes:   No icterus.   Conjunctiva pink. PERRLA. Ears:  Normal auditory acuity. Neck:  Supple; no masses or thyroidomegaly Lungs: Respirations even and unlabored. Lungs clear to auscultation bilaterally.   No wheezes, crackles, or rhonchi.  Heart:  Regular rate and rhythm;  Without murmur, clicks, rubs or gallops Abdomen:  Soft, nondistended, nontender. Normal bowel sounds. No appreciable masses or hepatomegaly.  No rebound or guarding.  Rectal:  Not performed. Msk:  Symmetrical without gross deformities.  Strength normal Extremities:  Without edema, cyanosis or clubbing. Neurologic:  Alert and oriented x3;  grossly normal neurologically. Skin:   Intact without significant lesions or rashes. Psych:  Alert and cooperative. Normal affect.  LAB RESULTS: CBC Latest Ref Rng & Units 01/12/2021 01/11/2021 01/08/2021  WBC 4.0 - 10.5 K/uL 8.4 5.0 7.4  Hemoglobin 12.0 - 15.0 g/dL 13.6 13.8 14.1  Hematocrit 36.0 - 46.0 % 38.8 39.8 41.6  Platelets 150 - 400 K/uL 272 282 312    BMET BMP Latest Ref Rng & Units 01/12/2021 01/11/2021 01/08/2021  Glucose 70 - 99 mg/dL 123(H) 111(H) 108(H)  BUN 8 - 23 mg/dL 11 11 11   Creatinine 0.44 - 1.00 mg/dL 0.96 0.81 1.09(H)  Sodium 135 - 145 mmol/L 136 137 140  Potassium 3.5 - 5.1 mmol/L 3.8 3.2(L) 3.7  Chloride 98 - 111 mmol/L 104 102 104  CO2 22 - 32 mmol/L 22 25 26   Calcium 8.9 - 10.3 mg/dL 9.2 9.3 9.7    LFT Hepatic Function Latest Ref Rng & Units 01/11/2021 01/08/2021 06/11/2016  Total Protein 6.5 - 8.1 g/dL 7.2 7.3 7.7  Albumin 3.5 - 5.0 g/dL 3.7 3.8 3.9  AST 15 - 41 U/L 15 12(L) 27  ALT 0 - 44 U/L 12 11 40  Alk Phosphatase 38 - 126 U/L 25(L) 24(L) 35(L)  Total Bilirubin 0.3 - 1.2 mg/dL 1.3(H) 1.3(H) 0.7     STUDIES: No results found.    Impression / Plan:   Kim Munoz is a 65 y.o. female with hypertension presented with generalized abdominal pain, nausea and vomiting, CT revealed mild dilation and severe wall thickening and inflammation involving the distal ileum  Terminal ileitis ?  Crohn's disease or infectious enteritis Recommend to discontinue antibiotics, no evidence of leukocytosis Recommend stool studies to rule out infection Recommend to check fecal calprotectin levels Patient is already started on IV steroids Continue symptomatic treatment, pain control, antiemetics and advance to clear liquids Patient will need colonoscopy once she is able to tolerate bowel prep to evaluate for Crohn's disease, will tentatively plan for colonoscopy tomorrow if patient is able to finish bowel prep overnight  I have discussed alternative options, risks & benefits,  which include, but are not  limited to, bleeding, infection, perforation,respiratory complication & drug reaction.  The patient agrees with this plan & written consent will be obtained.     Thank you for involving me in the care of this patient.      LOS: 1 day   Sherri Sear, MD  01/12/2021, 7:13 PM   Note: This dictation was prepared with Dragon dictation along with smaller phrase technology. Any transcriptional errors that result from this process are unintentional.

## 2021-01-13 ENCOUNTER — Inpatient Hospital Stay: Payer: BC Managed Care – PPO | Admitting: Anesthesiology

## 2021-01-13 ENCOUNTER — Telehealth: Payer: Self-pay

## 2021-01-13 ENCOUNTER — Encounter
Admission: EM | Disposition: A | Discharge status: Home / Self Care | Payer: Self-pay | Source: Home / Self Care | Attending: Internal Medicine

## 2021-01-13 ENCOUNTER — Other Ambulatory Visit: Payer: Self-pay

## 2021-01-13 ENCOUNTER — Encounter: Payer: Self-pay | Admitting: Internal Medicine

## 2021-01-13 DIAGNOSIS — K529 Noninfective gastroenteritis and colitis, unspecified: Secondary | ICD-10-CM

## 2021-01-13 HISTORY — PX: COLONOSCOPY WITH PROPOFOL: SHX5780

## 2021-01-13 LAB — CBC
HCT: 39.6 % (ref 36.0–46.0)
Hemoglobin: 13.8 g/dL (ref 12.0–15.0)
MCH: 28.2 pg (ref 26.0–34.0)
MCHC: 34.8 g/dL (ref 30.0–36.0)
MCV: 81 fL (ref 80.0–100.0)
Platelets: 299 10*3/uL (ref 150–400)
RBC: 4.89 MIL/uL (ref 3.87–5.11)
RDW: 13.6 % (ref 11.5–15.5)
WBC: 10.5 10*3/uL (ref 4.0–10.5)
nRBC: 0 % (ref 0.0–0.2)

## 2021-01-13 LAB — BASIC METABOLIC PANEL
Anion gap: 9 (ref 5–15)
BUN: 7 mg/dL — ABNORMAL LOW (ref 8–23)
CO2: 23 mmol/L (ref 22–32)
Calcium: 9.4 mg/dL (ref 8.9–10.3)
Chloride: 106 mmol/L (ref 98–111)
Creatinine, Ser: 0.86 mg/dL (ref 0.44–1.00)
GFR, Estimated: >60 mL/min (ref >60)
Glucose, Bld: 124 mg/dL — ABNORMAL HIGH (ref 70–99)
Potassium: 3.4 mmol/L — ABNORMAL LOW (ref 3.5–5.1)
Sodium: 138 mmol/L (ref 135–145)

## 2021-01-13 LAB — C DIFFICILE QUICK SCREEN W PCR REFLEX
C Diff antigen: NEGATIVE
C Diff interpretation: NOT DETECTED
C Diff toxin: NEGATIVE

## 2021-01-13 SURGERY — COLONOSCOPY WITH PROPOFOL
Anesthesia: General

## 2021-01-13 MED ORDER — PROPOFOL 500 MG/50ML IV EMUL: INTRAVENOUS | Status: DC | PRN

## 2021-01-13 MED ORDER — ONDANSETRON HCL 4 MG PO TABS: 4.0000 mg | ORAL_TABLET | Freq: Three times a day (TID) | ORAL | 0 refills | Status: AC | PRN

## 2021-01-13 MED ORDER — ONDANSETRON HCL 4 MG/2ML IJ SOLN
INTRAMUSCULAR | Status: DC | PRN
  Administered 2021-01-13: 4 mg via INTRAVENOUS

## 2021-01-13 MED ORDER — PROPOFOL 500 MG/50ML IV EMUL
INTRAVENOUS | Status: DC | PRN
  Administered 2021-01-13: 120 ug/kg/min via INTRAVENOUS

## 2021-01-13 NOTE — Plan of Care (Signed)

## 2021-01-13 NOTE — Anesthesia Procedure Notes (Addendum)
Performed by: Cook-Martin, Clydia Nieves Pre-anesthesia Checklist: Patient identified, Emergency Drugs available, Suction available, Patient being monitored and Timeout performed Patient Re-evaluated:Patient Re-evaluated prior to induction Oxygen Delivery Method: Simple face mask Preoxygenation: Pre-oxygenation with 100% oxygen Induction Type: IV induction Ventilation: Oral airway inserted - appropriate to patient size Placement Confirmation: positive ETCO2 and CO2 detector       

## 2021-01-13 NOTE — Progress Notes (Signed)
Patient had nurse called to room to speak with [facility] personnel department to get fax numbers for [facility] paperwork.  Made aware that her primary care physician would need to fill those papers out.

## 2021-01-13 NOTE — Plan of Care (Addendum)
Axox4. Calm and cooperative. Able to voice her needs. Prep for colonoscopy completed. Pt threw up twice drinking Miralax mix. But was able to clear her bowels. Pt NPO after 300am the time she was able to drink most of the prep. No complaint of pain. Vitals stable. Will continue to monitor.  Problem: Education: Goal: Knowledge of General Education information will improve Description: Including pain rating scale, medication(s)/side effects and non-pharmacologic comfort measures Outcome: Progressing   Problem: Health Behavior/Discharge Planning: Goal: Ability to manage health-related needs will improve Outcome: Progressing   Problem: Education: Goal: Knowledge of General Education information will improve Description: Including pain rating scale, medication(s)/side effects and non-pharmacologic comfort measures Outcome: Progressing   Problem: Clinical Measurements: Goal: Ability to maintain clinical measurements within normal limits will improve Outcome: Progressing Goal: Will remain free from infection Outcome: Progressing Goal: Diagnostic test results will improve Outcome: Progressing Goal: Respiratory complications will improve Outcome: Progressing Goal: Cardiovascular complication will be avoided Outcome: Progressing   Problem: Activity: Goal: Risk for activity intolerance will decrease Outcome: Progressing   Problem: Nutrition: Goal: Adequate nutrition will be maintained Outcome: Progressing   Problem: Coping: Goal: Level of anxiety will decrease Outcome: Progressing   Problem: Coping: Goal: Level of anxiety will decrease Outcome: Progressing   Problem: Elimination: Goal: Will not experience complications related to bowel motility Outcome: Progressing Goal: Will not experience complications related to urinary retention Outcome: Progressing   Problem: Pain Managment: Goal: General experience of comfort will improve Outcome: Progressing   Problem: Safety: Goal:  Ability to remain free from injury will improve Outcome: Progressing

## 2021-01-13 NOTE — Transfer of Care (Signed)
Immediate Anesthesia Transfer of Care Note  Patient: Kim Munoz  Procedure(s) Performed: COLONOSCOPY WITH PROPOFOL (N/A )  Patient Location: PACU and Endoscopy Unit  Anesthesia Type:General  Level of Consciousness: drowsy and patient cooperative  Airway & Oxygen Therapy: Patient Spontanous Breathing and Patient connected to face mask oxygen  Post-op Assessment: Report given to RN and Post -op Vital signs reviewed and stable  Post vital signs: Reviewed and stable  Last Vitals:  Vitals Value Taken Time  BP 126/83 01/13/21 1210  Temp    Pulse 68 01/13/21 1211  Resp 21 01/13/21 1211  SpO2 95 % 01/13/21 1211  Vitals shown include unvalidated device data.  Last Pain:  Vitals:   01/13/21 1210  TempSrc:   PainSc: 0-No pain         Complications: No complications documented.

## 2021-01-13 NOTE — Discharge Summary (Signed)
5        [place] at [facility]   PATIENT NAME: Kim Munoz    MR#:  [id number]  DATE OF BIRTH:  1956/02/15  DATE OF ADMISSION:  01/11/2021   ADMITTING PHYSICIAN: Lorretta Harp, MD  DATE OF DISCHARGE: 01/13/2021  4:50 PM  PRIMARY CARE PHYSICIAN: [facility]   ADMISSION DIAGNOSIS:  Intractable abdominal pain [R10.9] Intractable nausea and vomiting [R11.2] Abdominal pain [R10.9] DISCHARGE DIAGNOSIS:  Principal Problem:   Intractable abdominal pain Active Problems:   Hypertension   Lung nodule   Tobacco abuse   Hypokalemia  SECONDARY DIAGNOSIS:   Past Medical History:  Diagnosis Date  . Complication of anesthesia    nausea after colonoscopy  . Family history of adverse reaction to anesthesia    sister - nausea aafter surgery  . Hypertension   . Wears dentures    full upper and lower   HOSPITAL COURSE:  65 y.o.femalewith medical history significant ofhypertension, tobacco abuse, who presents with nausea, vomiting, abdominal painand diarrhea  Abdominal pain: CT was concerning for terminal ileitis/Crohn's disease/infectious enteritis Patient had normal colonoscopy.  She did not have any bowel movement in the hospital to check stool for any infectious pathology Patient's pain was much better controlled and she was tolerating diet at discharge She is scheduled to have outpatient video capsule study with GI  Hypertension -Controlled  Lung nodule:This is an incidental finding on CT scan. Follow-up with PCP for further work-up as an outpatient  Tobacco abuse -Nicotine patch  Hypokalemia -Repleted and resolved  Obesity Body mass index is 34.58 kg/m.   DISCHARGE CONDITIONS:  Stable CONSULTS OBTAINED:  Treatment Team:  Toney Reil, MD DRUG ALLERGIES:  No Known Allergies DISCHARGE MEDICATIONS:   Allergies as of 01/13/2021   No Known Allergies     Medication List    STOP taking these medications   azithromycin 250 MG  tablet Commonly known as: Zithromax   benzonatate 100 MG capsule Commonly known as: Tessalon Perles   cyclobenzaprine 5 MG tablet Commonly known as: FLEXERIL   hydrochlorothiazide 12.5 MG tablet Commonly known as: HYDRODIURIL   ibuprofen 800 MG tablet Commonly known as: ADVIL   ketorolac 10 MG tablet Commonly known as: TORADOL   methylPREDNISolone 4 MG Tbpk tablet Commonly known as: MEDROL DOSEPAK   oxyCODONE-acetaminophen 5-325 MG tablet Commonly known as: Roxicet   predniSONE 10 MG (21) Tbpk tablet Commonly known as: STERAPRED UNI-PAK 21 TAB     TAKE these medications   albuterol 108 (90 Base) MCG/ACT inhaler Commonly known as: VENTOLIN HFA Inhale 2-4 puffs by mouth every 4 hours as needed for wheezing, cough, and/or shortness of breath   amLODipine 10 MG tablet Commonly known as: NORVASC Take 10 mg by mouth every morning.   fexofenadine 180 MG tablet Commonly known as: ALLEGRA Take 1 tablet by mouth daily as needed.   HYDROcodone-acetaminophen 5-325 MG tablet Commonly known as: Norco Take 1 tablet by mouth every 6 (six) hours as needed for moderate pain.   ondansetron 4 MG tablet Commonly known as: Zofran Take 1 tablet (4 mg total) by mouth every 8 (eight) hours as needed. What changed:  when to take this Another medication with the same name was removed. Continue taking this medication, and follow the directions you see here.      DISCHARGE INSTRUCTIONS:   DIET:  Regular diet DISCHARGE CONDITION:  Stable ACTIVITY:  Activity as tolerated OXYGEN:  Home Oxygen: No.  Oxygen Delivery: room air DISCHARGE  LOCATION:  home   If you experience worsening of your admission symptoms, develop shortness of breath, life threatening emergency, suicidal or homicidal thoughts you must seek medical attention immediately by calling 911 or calling your MD immediately  if symptoms less severe.  You Must read complete instructions/literature along with all the  possible adverse reactions/side effects for all the Medicines you take and that have been prescribed to you. Take any new Medicines after you have completely understood and accpet all the possible adverse reactions/side effects.   Please note  You were cared for by a hospitalist during your hospital stay. If you have any questions about your discharge medications or the care you received while you were in the hospital after you are discharged, you can call the unit and asked to speak with the hospitalist on call if the hospitalist that took care of you is not available. Once you are discharged, your primary care physician will handle any further medical issues. Please note that NO REFILLS for any discharge medications will be authorized once you are discharged, as it is imperative that you return to your primary care physician (or establish a relationship with a primary care physician if you do not have one) for your aftercare needs so that they can reassess your need for medications and monitor your lab values.    On the day of Discharge:  VITAL SIGNS:  Blood pressure 136/67, pulse 60, temperature (!) 97.4 F (36.3 C), resp. rate 12, height 5\' 1"  (1.549 m), weight 81.6 kg, SpO2 98 %. PHYSICAL EXAMINATION:  GENERAL:  65 y.o.-year-old patient lying in the bed with no acute distress.  EYES: Pupils equal, round, reactive to light and accommodation. No scleral icterus. Extraocular muscles intact.  HEENT: Head atraumatic, normocephalic. Oropharynx and nasopharynx clear.  NECK:  Supple, no jugular venous distention. No thyroid enlargement, no tenderness.  LUNGS: Normal breath sounds bilaterally, no wheezing, rales,rhonchi or crepitation. No use of accessory muscles of respiration.  CARDIOVASCULAR: S1, S2 normal. No murmurs, rubs, or gallops.  ABDOMEN: Soft, non-tender, non-distended. Bowel sounds present. No organomegaly or mass.  EXTREMITIES: No pedal edema, cyanosis, or clubbing.  NEUROLOGIC:  Cranial nerves II through XII are intact. Muscle strength 5/5 in all extremities. Sensation intact. Gait not checked.  PSYCHIATRIC: The patient is alert and oriented x 3.  SKIN: No obvious rash, lesion, or ulcer.  DATA REVIEW:   CBC Recent Labs  Lab 01/13/21 0439  WBC 10.5  HGB 13.8  HCT 39.6  PLT 299    Chemistries  Recent Labs  Lab 01/11/21 1054 01/12/21 0443 01/13/21 0439  NA 137 136 138  K 3.2* 3.8 3.4*  CL 102 104 106  CO2 25 22 23   GLUCOSE 111* 123* 124*  BUN 11 11 7*  CREATININE 0.81 0.96 0.86  CALCIUM 9.3 9.2 9.4  MG  --  1.7  --   AST 15  --   --   ALT 12  --   --   ALKPHOS 25*  --   --   BILITOT 1.3*  --   --      Outpatient follow-up  Follow-up Information    [facility] Go on 01/21/2021.   Why: 10:45am appointment Contact information: [address] [place] [employer] [phone]        [id number], MD. Go on 02/10/2021.   Specialty: Gastroenterology Why: 1:15 Contact information: [address] [place] [address] [employer] [phone]  30 Day Unplanned Readmission Risk Score   Flowsheet Row ED to Hosp-Admission (Discharged) from 01/11/2021 in [facility]  30 Day Unplanned Readmission Risk Score (%) 9.55 Filed at 01/13/2021 1600     This score is the patient's risk of an unplanned readmission within 30 days of being discharged (0 -100%). The score is based on dignosis, age, lab data, medications, orders, and past utilization.   Low:  0-14.9   Medium: 15-21.9   High: 22-29.9   Extreme: 30 and above       Please note I have given patient work excuse per patient request. she was hospitalized from 4/24-4/26.  She can return to work on 01/15/2021.  Management plans discussed with the patient, family and they are in agreement.  CODE STATUS: Full Code   TOTAL TIME TAKING CARE OF THIS PATIENT: 35 minutes.    Delfino Lovett M.D on 01/13/2021 at 5:03 PM  [facility]   CC: Primary care physician; [facility]   Note: This dictation was prepared with Dragon dictation along with smaller phrase technology. Any transcriptional errors that result from this process are unintentional.

## 2021-01-13 NOTE — Discharge Instructions (Signed)

## 2021-01-13 NOTE — Telephone Encounter (Signed)
Per Dr. Federico Flake, please schedule VCE for her as outpt. Dx: enteritis, normal colonoscopy. You can call her room, thanks   Called patient room and got patient scheduled for 01/27/2021 went over instructions with patient. Asked patient to ask the nurse to print it for her also when she is discharge. I also mailed the instructions to the patient

## 2021-01-13 NOTE — Op Note (Signed)
[facility] Gastroenterology Patient Name: Adahlia Stembridge Procedure Date: 01/13/2021 11:01 AM MRN: [id number] Account #: [id number] Date of Birth: 1956-04-11 Admit Type: Outpatient Age: 65 Room: [facility] ENDO ROOM 3 Gender: Female Note Status: Finalized Procedure:             Colonoscopy Indications:           Abnormal CT of the GI tract Providers:             Toney Reil MD, MD Referring MD:          No Local Md, MD (Referring MD) Medicines:             General Anesthesia Complications:         No immediate complications. Estimated blood loss: None. Procedure:             Pre-Anesthesia Assessment:                        - Prior to the procedure, a History and Physical was                         performed, and patient medications and allergies were                         reviewed. The patient is competent. The risks and                         benefits of the procedure and the sedation options and                         risks were discussed with the patient. All questions                         were answered and informed consent was obtained.                         Patient identification and proposed procedure were                         verified by the physician, the nurse, the                         anesthesiologist, the anesthetist and the technician                         in the pre-procedure area in the procedure room in the                         endoscopy suite. Mental Status Examination: alert and                         oriented. Airway Examination: normal oropharyngeal                         airway and neck mobility. Respiratory Examination:                         clear to auscultation. CV Examination: normal.  Prophylactic Antibiotics: The patient does not require                         prophylactic antibiotics. Prior Anticoagulants: The                         patient has taken no previous anticoagulant or                          antiplatelet agents. ASA Grade Assessment: III - A                         patient with severe systemic disease. After reviewing                         the risks and benefits, the patient was deemed in                         satisfactory condition to undergo the procedure. The                         anesthesia plan was to use general anesthesia.                         Immediately prior to administration of medications,                         the patient was re-assessed for adequacy to receive                         sedatives. The heart rate, respiratory rate, oxygen                         saturations, blood pressure, adequacy of pulmonary                         ventilation, and response to care were monitored                         throughout the procedure. The physical status of the                         patient was re-assessed after the procedure.                        After obtaining informed consent, the colonoscope was                         passed under direct vision. Throughout the procedure,                         the patient's blood pressure, pulse, and oxygen                         saturations were monitored continuously. The                         Colonoscope was introduced through the anus and  advanced to the the cecum, identified by appendiceal                         orifice and ileocecal valve. The colonoscopy was                         extremely difficult due to significant looping and the                         patient's body habitus. Successful completion of the                         procedure was aided by changing the patient to a                         supine position, changing the patient to a prone                         position and applying abdominal pressure. The patient                         tolerated the procedure well. The quality of the bowel                         preparation was evaluated using the  BBPS [facility] Bowel                         Preparation Scale) with scores of: Right Colon = 3,                         Transverse Colon = 3 and Left Colon = 3 (entire mucosa                         seen well with no residual staining, small fragments                         of stool or opaque liquid). The total BBPS score                         equals 9. Findings:      The perianal and digital rectal examinations were normal. Pertinent       negatives include normal sphincter tone and no palpable rectal lesions.      The entire examined colon appeared normal.      The retroflexed view of the distal rectum and anal verge was normal and       showed no anal or rectal abnormalities.      Unable to intubate TI despite several attempts, abdominal pressure and       change in patient's position to supine and prone Impression:            - The entire examined colon is normal.                        - The distal rectum and anal verge are normal on  retroflexion view.                        - No specimens collected. Recommendation:        - Return patient to hospital ward for ongoing care.                        - Advance diet as tolerated today.                        - To visualize the small bowel, perform video capsule                         endoscopy at appointment to be scheduled as outpt                        - Discontinue steroids                        - Discontinue antibiotics Procedure Code(s):     --- Professional ---                        [phone], Colonoscopy, flexible; diagnostic, including                         collection of specimen(s) by brushing or washing, when                         performed (separate procedure) Diagnosis Code(s):     --- Professional ---                        R93.3, Abnormal findings on diagnostic imaging of                         other parts of digestive tract CPT copyright 2019 American Medical Association. All rights  reserved. The codes documented in this report are preliminary and upon coder review may  be revised to meet current compliance requirements. Dr. Libby Maw Toney Reil MD, MD 01/13/2021 12:06:40 PM This report has been signed electronically. Number of Addenda: 0 Note Initiated On: 01/13/2021 11:01 AM Scope Withdrawal Time: 0 hours 34 minutes 52 seconds  Total Procedure Duration: 0 hours 41 minutes 50 seconds  Estimated Blood Loss:  Estimated blood loss: none.      [facility]

## 2021-01-13 NOTE — Anesthesia Preprocedure Evaluation (Signed)
Anesthesia Evaluation  Patient identified by MRN, date of birth, ID band Patient awake    Reviewed: Allergy & Precautions, H&P , NPO status , Patient's Chart, lab work & pertinent test results, reviewed documented beta blocker date and time   History of Anesthesia Complications (+) Family history of anesthesia reaction and history of anesthetic complications  Airway Mallampati: III   Neck ROM: full    Dental  (+) Upper Dentures, Lower Dentures   Pulmonary neg pulmonary ROS, Current Smoker and Patient abstained from smoking.,    Pulmonary exam normal        Cardiovascular Exercise Tolerance: Good hypertension, On Medications negative cardio ROS Normal cardiovascular exam Rhythm:regular Rate:Normal     Neuro/Psych negative neurological ROS  negative psych ROS   GI/Hepatic negative GI ROS, Neg liver ROS,   Endo/Other  negative endocrine ROS  Renal/GU negative Renal ROS  negative genitourinary   Musculoskeletal   Abdominal   Peds  Hematology negative hematology ROS (+)   Anesthesia Other Findings Past Medical History: No date: Complication of anesthesia     Comment:  nausea after colonoscopy No date: Family history of adverse reaction to anesthesia     Comment:  sister - nausea aafter surgery No date: Hypertension No date: Wears dentures     Comment:  full upper and lower Past Surgical History: No date: ABDOMINAL HYSTERECTOMY No date: COLONOSCOPY 04/14/2016: HALLUX VALGUS LAPIDUS; Right     Comment:  Procedure: HALLUX VALGUS LAPIDUS CORRECTION RIGHT FOOT;               Surgeon: Gwyneth Revels, DPM;  Location: [facility]               [facility];  Service: Podiatry;  Laterality: Right;  WITH               POPLITEAL BLOCK 08/20/2016: HALLUX VALGUS LAPIDUS; Right     Comment:  Procedure: HALLUX VALGUS LAPIDUS;  Surgeon: Gwyneth Revels, DPM;  Location: [facility] ORS;  Service: Podiatry;                 Laterality: Right; BMI    Body Mass Index: 34.01 kg/m     Reproductive/Obstetrics negative OB ROS                             Anesthesia Physical Anesthesia Plan  ASA: III  Anesthesia Plan: General   Post-op Pain Management:    Induction:   PONV Risk Score and Plan:   Airway Management Planned:   Additional Equipment:   Intra-op Plan:   Post-operative Plan:   Informed Consent: I have reviewed the patients History and Physical, chart, labs and discussed the procedure including the risks, benefits and alternatives for the proposed anesthesia with the patient or authorized representative who has indicated his/her understanding and acceptance.     Dental Advisory Given  Plan Discussed with: CRNA  Anesthesia Plan Comments:         Anesthesia Quick Evaluation

## 2021-01-14 ENCOUNTER — Telehealth: Payer: Self-pay

## 2021-01-14 ENCOUNTER — Encounter: Payer: Self-pay | Admitting: Gastroenterology

## 2021-01-14 NOTE — Telephone Encounter (Signed)
We received [facility] paperwork on the patient. Talk to Dr. Allegra Lai and she said we can not do [facility] for her. Informed patient that this needed to come to PCP office. She states she will come and pick up the [facility]

## 2021-01-15 NOTE — Anesthesia Postprocedure Evaluation (Signed)
Anesthesia Post Note  Patient: Kim Munoz  Procedure(s) Performed: COLONOSCOPY WITH PROPOFOL (N/A )  Patient location during evaluation: PACU Anesthesia Type: General Level of consciousness: awake and alert Pain management: pain level controlled Vital Signs Assessment: post-procedure vital signs reviewed and stable Respiratory status: spontaneous breathing, nonlabored ventilation, respiratory function stable and patient connected to nasal cannula oxygen Cardiovascular status: blood pressure returned to baseline and stable Postop Assessment: no apparent nausea or vomiting Anesthetic complications: no   No complications documented.   Last Vitals:  Vitals:   01/13/21 1210 01/13/21 1226  BP: 126/83 136/67  Pulse:    Resp: 12   Temp:    SpO2: 98%     Last Pain:  Vitals:   01/14/21 0756  TempSrc:   PainSc: 0-No pain                 Yevette Edwards

## 2021-01-16 LAB — CULTURE, BLOOD (ROUTINE X 2)
Culture: NO GROWTH
Culture: NO GROWTH

## 2021-01-21 LAB — CALPROTECTIN, FECAL: Calprotectin, Fecal: 61 ug/g (ref 0–120)

## 2021-01-26 ENCOUNTER — Encounter: Payer: Self-pay | Admitting: Certified Registered Nurse Anesthetist

## 2021-01-26 ENCOUNTER — Encounter: Payer: Self-pay | Admitting: Gastroenterology

## 2021-01-27 ENCOUNTER — Ambulatory Visit
Admission: RE | Admit: 2021-01-27 | Discharge: 2021-01-27 | Disposition: A | Discharge status: Home / Self Care | Payer: BC Managed Care – PPO | Attending: Gastroenterology | Admitting: Gastroenterology

## 2021-01-27 ENCOUNTER — Other Ambulatory Visit: Payer: Self-pay

## 2021-01-27 ENCOUNTER — Encounter
Admission: RE | Disposition: A | Discharge status: Home / Self Care | Payer: Self-pay | Source: Home / Self Care | Attending: Gastroenterology

## 2021-01-27 DIAGNOSIS — K529 Noninfective gastroenteritis and colitis, unspecified: Secondary | ICD-10-CM | POA: Insufficient documentation

## 2021-01-27 HISTORY — PX: GIVENS CAPSULE STUDY: SHX5432

## 2021-01-27 SURGERY — IMAGING PROCEDURE, GI TRACT, INTRALUMINAL, VIA CAPSULE

## 2021-01-28 ENCOUNTER — Encounter: Payer: Self-pay | Admitting: Gastroenterology

## 2021-02-10 ENCOUNTER — Ambulatory Visit: Payer: BC Managed Care – PPO | Admitting: Gastroenterology

## 2021-02-10 ENCOUNTER — Other Ambulatory Visit: Payer: Self-pay

## 2021-02-10 ENCOUNTER — Encounter: Payer: Self-pay | Admitting: Gastroenterology

## 2021-02-10 VITALS — BP 173/111 | HR 88 | Temp 98.2°F | Ht 61.0 in | Wt 189.4 lb

## 2021-02-10 DIAGNOSIS — K529 Noninfective gastroenteritis and colitis, unspecified: Secondary | ICD-10-CM | POA: Diagnosis not present

## 2021-02-10 DIAGNOSIS — F17200 Nicotine dependence, unspecified, uncomplicated: Secondary | ICD-10-CM | POA: Insufficient documentation

## 2021-02-10 NOTE — Progress Notes (Signed)
Arlyss Repress, MD [address]  [address]  [place], [place] [address]  Main: [phone]  Fax: [phone]    Gastroenterology Consultation  Referring Provider:     [facility] Primary Care Physician:  [facility] Primary Gastroenterologist:  Dr. Arlyss Repress Reason for Consultation:     Regional enteritis        HPI:   Kim Munoz is a 65 y.o. female referred by Dr. Gavin [facility]  for consultation & management of recent episode of abdominal pain associated with nausea, vomiting and diarrhea.  Apparently, patient has been experiencing intermittent episodes of abdominal pain associated with nausea for last 2 years.  She was hospitalized for the symptoms at [facility] and was empirically treated with IV steroids as well as antibiotics for possible IBD.  She underwent colonoscopy which was unremarkable.  Subsequently underwent video capsule endoscopy which was unremarkable.  GI profile was negative for infectious etiology.  Fecal calprotectin levels were also normal.  Patient is here for hospital follow-up.  Doing very well, has gotten back to work and denies any GI symptoms.  NSAIDs: None  Antiplts/Anticoagulants/Anti thrombotics: None  GI Procedures:  Colonoscopy 01/13/2021 - The entire examined colon is normal. - The distal rectum and anal verge are normal on retroflexion view. - No specimens collected.  Video capsule endoscopy 01/27/2021 Capsule reached cecum, normal small bowel transit Normal small bowel mucosa  Past Medical History:  Diagnosis Date  . Complication of anesthesia    nausea after colonoscopy  . Family history of adverse reaction to anesthesia    sister - nausea aafter surgery  . Hypertension   . Wears dentures    full upper and lower    Past Surgical History:  Procedure Laterality Date  . ABDOMINAL HYSTERECTOMY    . COLONOSCOPY    . COLONOSCOPY WITH PROPOFOL N/A 01/13/2021   Procedure: COLONOSCOPY WITH PROPOFOL;   Surgeon: Toney Reil, MD;  Location: [facility] END[facility];  Service: Gastroenterology;  Laterality: N/A;  . GIVENS CAPSULE STUDY N/A 01/27/2021   Procedure: GIVENS CAPSULE STUDY;  Surgeon: Toney Reil, MD;  Location: [facility] ENDOSCOPY;  Service: Gastroenterology;  Laterality: N/A;  . HALLUX VALGUS LAPIDUS Right 04/14/2016   Procedure: HALLUX VALGUS LAPIDUS CORRECTION RIGHT FOOT;  Surgeon: Gwyneth Revels, DPM;  Location: [facility];  Service: Podiatry;  Laterality: Right;  WITH POPLITEAL BLOCK  . HALLUX VALGUS LAPIDUS Right 08/20/2016   Procedure: HALLUX VALGUS LAPIDUS;  Surgeon: Gwyneth Revels, DPM;  Location: [facility] ORS;  Service: Podiatry;  Laterality: Right;    Current Outpatient Medications:  .  albuterol (VENTOLIN HFA) 108 (90 Base) MCG/ACT inhaler, Inhale 2-4 puffs by mouth every 4 hours as needed for wheezing, cough, and/or shortness of breath, Disp: 6.7 g, Rfl: 0 .  amLODipine (NORVASC) 10 MG tablet, Take 10 mg by mouth every morning. , Disp: , Rfl:  .  fexofenadine (ALLEGRA) 180 MG tablet, Take 1 tablet by mouth daily as needed., Disp: , Rfl:  .  hydrochlorothiazide (HYDRODIURIL) 25 MG tablet, Take 1 tablet by mouth daily., Disp: , Rfl:  .  HYDROcodone-acetaminophen (NORCO) 5-325 MG tablet, Take 1 tablet by mouth every 6 (six) hours as needed for moderate pain., Disp: 6 tablet, Rfl: 0 .  ondansetron (ZOFRAN) 4 MG tablet, Take 1 tablet (4 mg total) by mouth every 8 (eight) hours as needed., Disp: 30 tablet, Rfl: 0   Family History  Problem Relation Age of Onset  . Kidney disease Mother   .  Thyroid cancer Sister      Social History   Tobacco Use  . Smoking status: Current Every Day Smoker    Packs/day: 0.50    Years: 35.00    Pack years: 17.50    Types: Cigarettes  . Smokeless tobacco: Never Used  Vaping Use  . Vaping Use: Never used  Substance Use Topics  . Alcohol use: Yes    Comment: 2 drinks/mo  . Drug use: Never    Allergies as of 02/10/2021  . (No Known  Allergies)    Review of Systems:    All systems reviewed and negative except where noted in HPI.   Physical Exam:  BP (!) 173/111 (BP Location: Left Arm, Patient Position: Sitting, Cuff Size: Large)   Pulse 88   Temp 98.2 F (36.8 C) (Oral)   Ht 5\' 1"  (1.549 m)   Wt 189 lb 6 oz (85.9 kg)   BMI 35.78 kg/m  No LMP recorded. Patient has had a hysterectomy.  General:   Alert,  Well-developed, well-nourished, pleasant and cooperative in NAD Head:  Normocephalic and atraumatic. Eyes:  Sclera clear, no icterus.   Conjunctiva pink. Ears:  Normal auditory acuity. Nose:  No deformity, discharge, or lesions. Mouth:  No deformity or lesions,oropharynx pink & moist. Neck:  Supple; no masses or thyromegaly. Lungs:  Respirations even and unlabored.  Clear throughout to auscultation.   No wheezes, crackles, or rhonchi. No acute distress. Heart:  Regular rate and rhythm; no murmurs, clicks, rubs, or gallops. Abdomen:  Normal bowel sounds. Soft, non-tender and non-distended without masses, hepatosplenomegaly or hernias noted.  No guarding or rebound tenderness.   Rectal: Not performed Msk:  Symmetrical without gross deformities. Good, equal movement & strength bilaterally. Pulses:  Normal pulses noted. Extremities:  No clubbing or edema.  No cyanosis. Neurologic:  Alert and oriented x3;  grossly normal neurologically. Skin:  Intact without significant lesions or rashes. No jaundice. Psych:  Alert and cooperative. Normal mood and affect.  Imaging Studies: Reviewed  Assessment and Plan:   Kim Munoz is a 65 y.o. female with history of hypertension, not well controlled, tobacco use is seen as a hospital follow-up for recent episode of lower abdominal pain associated nausea and vomiting, CT revealed enteritis.  Colonoscopy was normal, was unable to intubate the terminal ileum, underwent video capsule endoscopy which was unremarkable as well.  Patient is currently asymptomatic.  GI profile PCR  negative as well as calprotectin levels were normal.  I did not recommend any further treatment or investigation at this time  Patient's blood pressure was elevated today and she was asymptomatic.  She says she forgot her blood pressure pill this morning and its normal for her blood pressure to be high whenever she forgets her medication.  She said she will take her medication after returning to her home from work this afternoon   Follow up as needed   68, MD

## 2021-03-25 ENCOUNTER — Other Ambulatory Visit: Payer: Self-pay

## 2021-03-25 ENCOUNTER — Emergency Department: Payer: BC Managed Care – PPO

## 2021-03-25 ENCOUNTER — Encounter: Payer: Self-pay | Admitting: Emergency Medicine

## 2021-03-25 ENCOUNTER — Emergency Department
Admission: EM | Admit: 2021-03-25 | Discharge: 2021-03-25 | Disposition: A | Discharge status: Home / Self Care | Payer: BC Managed Care – PPO | Attending: Emergency Medicine | Admitting: Emergency Medicine

## 2021-03-25 DIAGNOSIS — Z79899 Other long term (current) drug therapy: Secondary | ICD-10-CM | POA: Diagnosis not present

## 2021-03-25 DIAGNOSIS — I10 Essential (primary) hypertension: Secondary | ICD-10-CM | POA: Diagnosis not present

## 2021-03-25 DIAGNOSIS — F17210 Nicotine dependence, cigarettes, uncomplicated: Secondary | ICD-10-CM | POA: Insufficient documentation

## 2021-03-25 DIAGNOSIS — K529 Noninfective gastroenteritis and colitis, unspecified: Secondary | ICD-10-CM | POA: Insufficient documentation

## 2021-03-25 DIAGNOSIS — R109 Unspecified abdominal pain: Secondary | ICD-10-CM | POA: Diagnosis present

## 2021-03-25 LAB — URINALYSIS, COMPLETE (UACMP) WITH MICROSCOPIC
Bilirubin Urine: NEGATIVE
Glucose, UA: NEGATIVE mg/dL
Ketones, ur: 20 mg/dL — AB
Leukocytes,Ua: NEGATIVE
Nitrite: NEGATIVE
Protein, ur: 30 mg/dL — AB
Specific Gravity, Urine: 1.015 (ref 1.005–1.030)
pH: 5 (ref 5.0–8.0)

## 2021-03-25 LAB — LIPASE, BLOOD: Lipase: 29 U/L (ref 11–51)

## 2021-03-25 LAB — CBC
HCT: 39.7 % (ref 36.0–46.0)
Hemoglobin: 13.9 g/dL (ref 12.0–15.0)
MCH: 28.5 pg (ref 26.0–34.0)
MCHC: 35 g/dL (ref 30.0–36.0)
MCV: 81.5 fL (ref 80.0–100.0)
Platelets: 303 10*3/uL (ref 150–400)
RBC: 4.87 MIL/uL (ref 3.87–5.11)
RDW: 13.7 % (ref 11.5–15.5)
WBC: 7.2 10*3/uL (ref 4.0–10.5)
nRBC: 0 % (ref 0.0–0.2)

## 2021-03-25 LAB — COMPREHENSIVE METABOLIC PANEL
ALT: 15 U/L (ref 0–44)
AST: 15 U/L (ref 15–41)
Albumin: 3.7 g/dL (ref 3.5–5.0)
Alkaline Phosphatase: 31 U/L — ABNORMAL LOW (ref 38–126)
Anion gap: 10 (ref 5–15)
BUN: 11 mg/dL (ref 8–23)
CO2: 22 mmol/L (ref 22–32)
Calcium: 9.4 mg/dL (ref 8.9–10.3)
Chloride: 107 mmol/L (ref 98–111)
Creatinine, Ser: 0.69 mg/dL (ref 0.44–1.00)
GFR, Estimated: >60 mL/min (ref >60)
Glucose, Bld: 95 mg/dL (ref 70–99)
Potassium: 3.4 mmol/L — ABNORMAL LOW (ref 3.5–5.1)
Sodium: 139 mmol/L (ref 135–145)
Total Bilirubin: 1.3 mg/dL — ABNORMAL HIGH (ref 0.3–1.2)
Total Protein: 7.5 g/dL (ref 6.5–8.1)

## 2021-03-25 MED ORDER — LACTATED RINGERS IV BOLUS
1000.0000 mL | Freq: Once | INTRAVENOUS | Status: AC
  Administered 2021-03-25: 1000 mL via INTRAVENOUS

## 2021-03-25 MED ORDER — ONDANSETRON HCL 4 MG/2ML IJ SOLN
4.0000 mg | Freq: Once | INTRAMUSCULAR | Status: AC
  Administered 2021-03-25: 4 mg via INTRAVENOUS
  Filled 2021-03-25: qty 2

## 2021-03-25 MED ORDER — MORPHINE SULFATE (PF) 4 MG/ML IV SOLN
4.0000 mg | Freq: Once | INTRAVENOUS | Status: AC
  Administered 2021-03-25: 4 mg via INTRAVENOUS
  Filled 2021-03-25: qty 1

## 2021-03-25 MED ORDER — IOHEXOL 350 MG/ML SOLN
100.0000 mL | Freq: Once | INTRAVENOUS | Status: AC | PRN
  Administered 2021-03-25: 100 mL via INTRAVENOUS

## 2021-03-25 MED ORDER — ONDANSETRON 4 MG PO TBDP: 4.0000 mg | ORAL_TABLET | Freq: Three times a day (TID) | ORAL | 0 refills | Status: DC | PRN

## 2021-03-25 MED ORDER — LOPERAMIDE HCL 2 MG PO TABS: 2.0000 mg | ORAL_TABLET | Freq: Four times a day (QID) | ORAL | 0 refills | Status: DC | PRN

## 2021-03-25 MED ORDER — LABETALOL HCL 5 MG/ML IV SOLN
10.0000 mg | Freq: Once | INTRAVENOUS | Status: AC
  Administered 2021-03-25: 10 mg via INTRAVENOUS
  Filled 2021-03-25: qty 4

## 2021-03-25 NOTE — ED Triage Notes (Signed)
Pt comes into the ED via POV c/o generalized abd pain and diarrhea since Sunday.  Pt states she hasnt been able to get any solid food down since Sunday.  Pt denies any nausea or vomiting.  Pt has even and unlabored respirations at this time.

## 2021-03-25 NOTE — ED Notes (Signed)
md in with pt

## 2021-03-25 NOTE — ED Notes (Signed)
Pt reports abd pain with n/v/d  Pt has not taken her meds in 3 days.  bp elevated.  Pt has a headache.  Pt alert  Speech clear.

## 2021-03-25 NOTE — ED Provider Notes (Signed)
[facility] Emergency Department Provider Note   ____________________________________________   Event Date/Time   First MD Initiated Contact with Patient 03/25/21 1942     (approximate)  I have reviewed the triage vital signs and the nursing notes.   HISTORY  Chief Complaint Abdominal Pain and Diarrhea    HPI Kim Munoz is a 65 y.o. female with past medical history of hypertension who presents to the ED complaining of abdominal pain.  Patient reports that she has been dealing with intermittent sharp pain in her abdomen diffusely for the past 4 days.  It is not exacerbated or alleviated by anything particular and does not affect anyone particular part of her abdomen.  It is been associated with nausea, vomiting, and diarrhea.  She denies any associated blood in her emesis or stool.  She has not had any fevers, dysuria, or flank pain.  She does report similar pain in the past, but states they have never been able to tell her what is causing it.  She is not currently taking anything for her pain at home.  She denies any prior abdominal surgeries.        Past Medical History:  Diagnosis Date   Complication of anesthesia    nausea after colonoscopy   Family history of adverse reaction to anesthesia    sister - nausea aafter surgery   Hypertension    Wears dentures    full upper and lower    Patient Active Problem List   Diagnosis Date Noted   Tobacco dependence 02/10/2021   Intractable abdominal pain 01/11/2021   Lung nodule 01/11/2021   Tobacco abuse 01/11/2021   Hypokalemia 01/11/2021   Hypertension    Intractable nausea and vomiting 06/12/2016   Obesity (BMI 30.0-34.9) 05/10/2016   Vaccine counseling 05/10/2016   Hallux valgus of right foot 04/09/2016    Past Surgical History:  Procedure Laterality Date   ABDOMINAL HYSTERECTOMY     COLONOSCOPY     COLONOSCOPY WITH PROPOFOL N/A 01/13/2021   Procedure: COLONOSCOPY WITH PROPOFOL;  Surgeon:  Toney Reil, MD;  Location: [facility] ENDOSCOPY;  Service: Gastroenterology;  Laterality: N/A;   GIVENS CAPSULE STUDY N/A 01/27/2021   Procedure: GIVENS CAPSULE STUDY;  Surgeon: Toney Reil, MD;  Location: [facility] ENDOSCOPY;  Service: Gastroenterology;  Laterality: N/A;   HALLUX VALGUS LAPIDUS Right 04/14/2016   Procedure: HALLUX VALGUS LAPIDUS CORRECTION RIGHT FOOT;  Surgeon: Gwyneth Revels, DPM;  Location: [facility]ERY [facility];  Service: Podiatry;  Laterality: Right;  WITH POPLITEAL BLOCK   HALLUX VALGUS LAPIDUS Right 08/20/2016   Procedure: HALLUX VALGUS LAPIDUS;  Surgeon: Gwyneth Revels, DPM;  Location: [facility] ORS;  Service: Podiatry;  Laterality: Right;    Prior to Admission medications   Medication Sig Start Date End Date Taking? Authorizing Provider  loperamide (IMODIUM A-D) 2 MG tablet Take 1 tablet (2 mg total) by mouth 4 (four) times daily as needed for diarrhea or loose stools. 03/25/21  Yes Chesley Noon, MD  ondansetron (ZOFRAN ODT) 4 MG disintegrating tablet Take 1 tablet (4 mg total) by mouth every 8 (eight) hours as needed for nausea or vomiting. 03/25/21  Yes Chesley Noon, MD  albuterol (VENTOLIN HFA) 108 (90 Base) MCG/ACT inhaler Inhale 2-4 puffs by mouth every 4 hours as needed for wheezing, cough, and/or shortness of breath 03/24/20   Loleta Rose, MD  amLODipine (NORVASC) 10 MG tablet Take 10 mg by mouth every morning.     [provider]  fexofenadine (ALLEGRA) 180 MG  tablet Take 1 tablet by mouth daily as needed. 07/07/20 07/07/21  [provider]  hydrochlorothiazide (HYDRODIURIL) 25 MG tablet Take 1 tablet by mouth daily. 01/21/21   [provider]  HYDROcodone-acetaminophen (NORCO) 5-325 MG tablet Take 1 tablet by mouth every 6 (six) hours as needed for moderate pain. 01/08/21   Minna Antis, MD    Allergies Patient has no known allergies.  Family History  Problem Relation Age of Onset   Kidney disease Mother    Thyroid cancer Sister      Social History Social History   Tobacco Use   Smoking status: Every Day    Packs/day: 0.50    Years: 35.00    Pack years: 17.50    Types: Cigarettes   Smokeless tobacco: Never  Vaping Use   Vaping Use: Never used  Substance Use Topics   Alcohol use: Yes    Comment: 2 drinks/mo   Drug use: Never    Review of Systems  Constitutional: No fever/chills Eyes: No visual changes. ENT: No sore throat. Cardiovascular: Denies chest pain. Respiratory: Denies shortness of breath. Gastrointestinal: Positive for abdominal pain, nausea, vomiting, and diarrhea.  No constipation. Genitourinary: Negative for dysuria. Musculoskeletal: Negative for back pain. Skin: Negative for rash. Neurological: Negative for headaches, focal weakness or numbness.  ____________________________________________   PHYSICAL EXAM:  VITAL SIGNS: ED Triage Vitals [03/25/21 1519]  Enc Vitals Group     BP (!) 187/113     Pulse Rate 88     Resp 20     Temp 98.8 F (37.1 C)     Temp Source Oral     SpO2 98 %     Weight 184 lb (83.5 kg)     Height 5\' 1"  (1.549 m)     Head Circumference      Peak Flow      Pain Score 10     Pain Loc      Pain Edu?      Excl. in GC?     Constitutional: Alert and oriented. Eyes: Conjunctivae are normal. Head: Atraumatic. Nose: No congestion/rhinnorhea. Mouth/Throat: Mucous membranes are moist. Neck: Normal ROM Cardiovascular: Normal rate, regular rhythm. Grossly normal heart sounds.  2+ radial pulses bilaterally. Respiratory: Normal respiratory effort.  No retractions. Lungs CTAB. Gastrointestinal: Soft and diffusely tender to palpation with no rebound or guarding.  No distention. Genitourinary: deferred Musculoskeletal: No lower extremity tenderness nor edema. Neurologic:  Normal speech and language. No gross focal neurologic deficits are appreciated. Skin:  Skin is warm, dry and intact. No rash noted. Psychiatric: Mood and affect are normal. Speech and  behavior are normal.  ____________________________________________   LABS (all labs ordered are listed, but only abnormal results are displayed)  Labs Reviewed  COMPREHENSIVE METABOLIC PANEL - Abnormal; Notable for the following components:      Result Value   Potassium 3.4 (*)    Alkaline Phosphatase 31 (*)    Total Bilirubin 1.3 (*)    All other components within normal limits  URINALYSIS, COMPLETE (UACMP) WITH MICROSCOPIC - Abnormal; Notable for the following components:   Color, Urine YELLOW (*)    APPearance HAZY (*)    Hgb urine dipstick SMALL (*)    Ketones, ur 20 (*)    Protein, ur 30 (*)    Bacteria, UA RARE (*)    All other components within normal limits  LIPASE, BLOOD  CBC    PROCEDURES  Procedure(s) performed (including Critical Care):  Procedures   ____________________________________________  INITIAL IMPRESSION / ASSESSMENT AND PLAN / ED COURSE      65 year old female with past medical history of hypertension who presents to the ED for diffuse abdominal pain intermittently over the past 4 days associated with vomiting and diarrhea.  Patient with diffuse tenderness on exam, initial labs are reassuring, but we will further assess with CT scan.  We will treat symptomatically with IV morphine and Zofran, hydrate with IV fluids.  UA shows no signs of infection.  CT scan appears consistent with an enteritis, no other apparent acute pathology.  Patient reports feeling better following morphine and Zofran, she is appropriate for discharge home with PCP follow-up.  She will be prescribed Zofran as well as Imodium, was counseled to drink plenty of fluids and to return to the ED for new or worsening symptoms.  Patient agrees with plan.      ____________________________________________   FINAL CLINICAL IMPRESSION(S) / ED DIAGNOSES  Final diagnoses:  Gastroenteritis     ED Discharge Orders          Ordered    ondansetron (ZOFRAN ODT) 4 MG disintegrating  tablet  Every 8 hours PRN        03/25/21 2309    loperamide (IMODIUM A-D) 2 MG tablet  4 times daily PRN        03/25/21 2309             Note:  This document was prepared using Dragon voice recognition software and may include unintentional dictation errors.    Chesley Noon, MD 03/25/21 [phone]

## 2021-03-25 NOTE — ED Notes (Signed)
Iv meds given.  Pt alert.   

## 2021-07-02 ENCOUNTER — Emergency Department
Admission: EM | Admit: 2021-07-02 | Discharge: 2021-07-02 | Disposition: A | Discharge status: Home / Self Care | Payer: BC Managed Care – PPO | Attending: Emergency Medicine | Admitting: Emergency Medicine

## 2021-07-02 ENCOUNTER — Emergency Department: Payer: BC Managed Care – PPO

## 2021-07-02 ENCOUNTER — Other Ambulatory Visit: Payer: Self-pay

## 2021-07-02 DIAGNOSIS — Z79899 Other long term (current) drug therapy: Secondary | ICD-10-CM | POA: Insufficient documentation

## 2021-07-02 DIAGNOSIS — F17210 Nicotine dependence, cigarettes, uncomplicated: Secondary | ICD-10-CM | POA: Diagnosis not present

## 2021-07-02 DIAGNOSIS — I10 Essential (primary) hypertension: Secondary | ICD-10-CM | POA: Insufficient documentation

## 2021-07-02 DIAGNOSIS — R6884 Jaw pain: Secondary | ICD-10-CM | POA: Insufficient documentation

## 2021-07-02 DIAGNOSIS — M542 Cervicalgia: Secondary | ICD-10-CM | POA: Insufficient documentation

## 2021-07-02 DIAGNOSIS — R519 Headache, unspecified: Secondary | ICD-10-CM | POA: Diagnosis present

## 2021-07-02 DIAGNOSIS — R202 Paresthesia of skin: Secondary | ICD-10-CM | POA: Insufficient documentation

## 2021-07-02 DIAGNOSIS — R7303 Prediabetes: Secondary | ICD-10-CM | POA: Diagnosis present

## 2021-07-02 LAB — CBC
HCT: 38.6 % (ref 36.0–46.0)
Hemoglobin: 13.7 g/dL (ref 12.0–15.0)
MCH: 29 pg (ref 26.0–34.0)
MCHC: 35.5 g/dL (ref 30.0–36.0)
MCV: 81.8 fL (ref 80.0–100.0)
Platelets: 283 10*3/uL (ref 150–400)
RBC: 4.72 MIL/uL (ref 3.87–5.11)
RDW: 13.9 % (ref 11.5–15.5)
WBC: 6.6 10*3/uL (ref 4.0–10.5)
nRBC: 0 % (ref 0.0–0.2)

## 2021-07-02 LAB — TROPONIN I (HIGH SENSITIVITY)
Troponin I (High Sensitivity): 4 ng/L (ref <18)
Troponin I (High Sensitivity): 3 ng/L (ref <18)

## 2021-07-02 LAB — BASIC METABOLIC PANEL
Anion gap: 7 (ref 5–15)
BUN: 11 mg/dL (ref 8–23)
CO2: 31 mmol/L (ref 22–32)
Calcium: 9.3 mg/dL (ref 8.9–10.3)
Chloride: 102 mmol/L (ref 98–111)
Creatinine, Ser: 0.74 mg/dL (ref 0.44–1.00)
GFR, Estimated: >60 mL/min (ref >60)
Glucose, Bld: 95 mg/dL (ref 70–99)
Potassium: 4.2 mmol/L (ref 3.5–5.1)
Sodium: 140 mmol/L (ref 135–145)

## 2021-07-02 MED ORDER — AMLODIPINE BESYLATE 5 MG PO TABS
5.0000 mg | ORAL_TABLET | Freq: Once | ORAL | Status: AC
  Administered 2021-07-02: 5 mg via ORAL
  Filled 2021-07-02: qty 1

## 2021-07-02 MED ORDER — HYDROCHLOROTHIAZIDE 25 MG PO TABS
25.0000 mg | ORAL_TABLET | Freq: Every day | ORAL | Status: DC
  Administered 2021-07-02: 25 mg via ORAL
  Filled 2021-07-02: qty 1

## 2021-07-02 NOTE — ED Triage Notes (Signed)
Pt here with left arm and jaw pain. Pt was sent at her primary's office for her hypertension and was sent here for a cardiac workup given her symptoms. Pt denies CP but has some lightheadedness. Pt in NAD in triage.

## 2021-07-02 NOTE — ED Provider Notes (Signed)
Emergency Medicine Provider Triage Evaluation Note  Kim Munoz , a 65 y.o. female  was evaluated in triage.  Pt complains of elevated blood pressure with radiation to the left arm.  Patient is not taking her blood pressure medication today.  Review of Systems  Positive: Left-sided arm pain, radiates to the neck Negative: Denies chest pain or shortness of breath  Physical Exam  BP (!) 199/122 (BP Location: Left Arm)   Pulse 72   Temp 98.2 F (36.8 C) (Oral)   Resp 18   Ht 5\' 1"  (1.549 m)   Wt 88.9 kg   SpO2 100%   BMI 37.03 kg/m  Gen:   Awake, no distress   Resp:  Normal effort  MSK:   Moves extremities without difficulty  Other:    Medical Decision Making  Medically screening exam initiated at 1:11 PM.  Appropriate orders placed.  [name] was informed that the remainder of the evaluation will be completed by another provider, this initial triage assessment does not replace that evaluation, and the importance of remaining in the ED until their evaluation is complete.  Since patient did not take her blood pressure medication today we will give her her regular dose while awaiting labs.   Little Ishikawa, PA-C 07/02/21 1312    07/04/21, MD 07/02/21 [phone]

## 2021-07-02 NOTE — Discharge Instructions (Addendum)
Please continue to take your amlodipine and HCTZ.  Please purchase a blood pressure cuff and start taking a daily measurement and record this.  He should then follow-up with your primary care provider in 1 to 2 weeks and show him these numbers so he can make any adjustments to your blood pressure medications.  If you develop chest pain or shortness of breath, please return to the emergency department.

## 2021-07-02 NOTE — ED Provider Notes (Signed)
[facility]  ____________________________________________   Event Date/Time   First MD Initiated Contact with Patient 07/02/21 1642     (approximate)  I have reviewed the triage vital signs and the nursing notes.   HISTORY  Chief Complaint Hypertension    HPI Kim Munoz is a 65 y.o. female past medical history of hypertension difficult to control who presents with an elevated blood pressure reading and jaw pain.  Patient had a routine appointment with her primary care provider today.  She was noted to be hypertensive in the office and complained of jaw pain so she was told to go to straight to the emergency department.  Patient did not take her amlodipine or HCTZ this morning.  She notes that her blood pressure is usually high, does not have a cuff at home does not check this regularly.  She has had some intermittent left jaw pain for about 1 week.  There is no exacerbating factor.  It is not exertional and she has no associated symptoms of chest pain dyspnea nausea vomiting or diaphoresis.  Pain is worse when she opens her mouth wide.  Patient also complains of some left lateral neck pain and intermittent numbness radiating from her neck down to her fingers.  She denies weakness.  She denies visual change, has had some mild headache.  No abdominal pain no lower extremity edema.         Past Medical History:  Diagnosis Date   Complication of anesthesia    nausea after colonoscopy   Family history of adverse reaction to anesthesia    sister - nausea aafter surgery   Hypertension    Wears dentures    full upper and lower    Patient Active Problem List   Diagnosis Date Noted   Tobacco dependence 02/10/2021   Intractable abdominal pain 01/11/2021   Lung nodule 01/11/2021   Tobacco abuse 01/11/2021   Hypokalemia 01/11/2021   Hypertension    Intractable nausea and vomiting 06/12/2016   Obesity (BMI 30.0-34.9) 05/10/2016   Vaccine counseling  05/10/2016   Hallux valgus of right foot 04/09/2016    Past Surgical History:  Procedure Laterality Date   ABDOMINAL HYSTERECTOMY     COLONOSCOPY     COLONOSCOPY WITH PROPOFOL N/A 01/13/2021   Procedure: COLONOSCOPY WITH PROPOFOL;  Surgeon: Toney Reil, MD;  Location: [facility] ENDOSCOPY;  Service: Gastroenterology;  Laterality: N/A;   GIVENS CAPSULE STUDY N/A 01/27/2021   Procedure: GIVENS CAPSULE STUDY;  Surgeon: Toney Reil, MD;  Location: [facility] ENDOSCOPY;  Service: Gastroenterology;  Laterality: N/A;   HALLUX VALGUS LAPIDUS Right 04/14/2016   Procedure: HALLUX VALGUS LAPIDUS CORRECTION RIGHT FOOT;  Surgeon: Gwyneth Revels, DPM;  Location: [facility];  Service: Podiatry;  Laterality: Right;  WITH POPLITEAL BLOCK   HALLUX VALGUS LAPIDUS Right 08/20/2016   Procedure: HALLUX VALGUS LAPIDUS;  Surgeon: Gwyneth Revels, DPM;  Location: [facility] ORS;  Service: Podiatry;  Laterality: Right;    Prior to Admission medications   Medication Sig Start Date End Date Taking? Authorizing Provider  albuterol (VENTOLIN HFA) 108 (90 Base) MCG/ACT inhaler Inhale 2-4 puffs by mouth every 4 hours as needed for wheezing, cough, and/or shortness of breath 03/24/20   Loleta Rose, MD  amLODipine (NORVASC) 10 MG tablet Take 10 mg by mouth every morning.     [provider]  fexofenadine (ALLEGRA) 180 MG tablet Take 1 tablet by mouth daily as needed. 07/07/20 07/07/21  [provider]  hydrochlorothiazide (HYDRODIURIL) 25  MG tablet Take 1 tablet by mouth daily. 01/21/21   [provider]  HYDROcodone-acetaminophen (NORCO) 5-325 MG tablet Take 1 tablet by mouth every 6 (six) hours as needed for moderate pain. 01/08/21   Minna Antis, MD  loperamide (IMODIUM A-D) 2 MG tablet Take 1 tablet (2 mg total) by mouth 4 (four) times daily as needed for diarrhea or loose stools. 03/25/21   Chesley Noon, MD  ondansetron (ZOFRAN ODT) 4 MG disintegrating tablet Take 1 tablet (4 mg total) by  mouth every 8 (eight) hours as needed for nausea or vomiting. 03/25/21   Chesley Noon, MD    Allergies Patient has no known allergies.  Family History  Problem Relation Age of Onset   Kidney disease Mother    Thyroid cancer Sister     Social History Social History   Tobacco Use   Smoking status: Every Day    Packs/day: 0.50    Years: 35.00    Pack years: 17.50    Types: Cigarettes   Smokeless tobacco: Never  Vaping Use   Vaping Use: Never used  Substance Use Topics   Alcohol use: Yes    Comment: 2 drinks/mo   Drug use: Never    Review of Systems   Review of Systems  HENT:  Negative for dental problem and facial swelling.   Eyes:  Negative for visual disturbance.  Respiratory:  Negative for shortness of breath.   Cardiovascular:  Negative for chest pain, palpitations and leg swelling.  Gastrointestinal:  Negative for abdominal pain, nausea and vomiting.  Musculoskeletal:  Positive for neck pain.  Neurological:  Positive for numbness and headaches. Negative for weakness and light-headedness.  All other systems reviewed and are negative.  Physical Exam Updated Vital Signs BP (!) 185/95 (BP Location: Right Arm)   Pulse 88   Temp 98.8 F (37.1 C) (Oral)   Resp 18   Ht 5\' 1"  (1.549 m)   Wt 88.9 kg   SpO2 98%   BMI 37.03 kg/m   Physical Exam Vitals and nursing note reviewed.  Constitutional:      General: She is not in acute distress.    Appearance: Normal appearance.  HENT:     Head: Normocephalic and atraumatic.     Comments: No significant facial swelling or tenderness to palpation over the TMJ or mandible Pain is reproduced with opening of her mouth Eyes:     General: No scleral icterus.    Conjunctiva/sclera: Conjunctivae normal.  Neck:     Comments: Mild tenderness to palpation over the left lateral neck Pulmonary:     Effort: Pulmonary effort is normal. No respiratory distress.     Breath sounds: No stridor.  Musculoskeletal:        General: No  deformity or signs of injury.     Cervical back: Normal range of motion.     Right lower leg: No edema.     Left lower leg: No edema.  Skin:    General: Skin is dry.     Coloration: Skin is not jaundiced or pale.  Neurological:     General: No focal deficit present.     Mental Status: She is alert and oriented to person, place, and time. Mental status is at baseline.  Psychiatric:        Mood and Affect: Mood normal.        Behavior: Behavior normal.     LABS (all labs ordered are listed, but only abnormal results are displayed)  Labs  Reviewed  BASIC METABOLIC PANEL  CBC  TROPONIN I (HIGH SENSITIVITY)  TROPONIN I (HIGH SENSITIVITY)   ____________________________________________  EKG  LVH, normal sinus rhythm, normal axis, normal intervals, no acute ischemic changes ____________________________________________  RADIOLOGY I, Randol Kern, personally viewed and evaluated these images (plain radiographs) as part of my medical decision making, as well as reviewing the written report by the radiologist.  ED MD interpretation:  I reviewed the CXR which does not show any acute cardiopulmonary process      ____________________________________________   PROCEDURES  Procedure(s) performed (including Critical Care):  Procedures   ____________________________________________   INITIAL IMPRESSION / ASSESSMENT AND PLAN / ED COURSE     Patient is a 65 year old female presenting with elevated blood pressure from her primary care's office.  She has also had some intermittent left jaw pain and numbness in the left arm which have been going on for about a week.  In terms of her hypertension, she is hypertensive today 200s systolic, had not taken her amlodipine or HCTZ yet today.  Patient does not check her blood pressure at home.  Notes that it has been difficult to control in the past.  She was previously taking amlodipine HCTZ and lisinopril but her PCP told her to stop the  lisinopril.  In terms of her jaw pain, it is isolated to the jaw, is worse with mouth opening and is without associated symptoms of chest pain, dyspnea, nausea or diaphoresis.  I do not feel that this jaw pain is an anginal equivalent, I feel that this is an unassociated problem.  Suspect TMJ dysfunction.  In terms of her neck pain she has no midline tenderness and no objective weakness.  Suspect cervical radiculopathy.  Patient's EKG is nonischemic and her troponin is negative.  This is reassuring especially because her jaw pain has been going on for about a week and has been constant over the last 2 days, if this were an anginal equivalent I would expect her troponin to be elevated.  Patient was given her HCTZ and amlodipine in triage.  I advised her to purchase a blood pressure cuff and start recording daily BP so that she can show this to her primary care provider and they can make any necessary adjustments in her medication.  I did advise return precautions for chest pain or dyspnea.  Patient was comfortable with the plan.      ____________________________________________   FINAL CLINICAL IMPRESSION(S) / ED DIAGNOSES  Final diagnoses:  Hypertension, unspecified type  Jaw pain     ED Discharge Orders     None        Note:  This document was prepared using Dragon voice recognition software and may include unintentional dictation errors.    Georga Hacking, MD 07/02/21 [phone]

## 2022-06-02 ENCOUNTER — Emergency Department: Payer: BC Managed Care – PPO

## 2022-06-02 ENCOUNTER — Other Ambulatory Visit: Payer: Self-pay

## 2022-06-02 ENCOUNTER — Inpatient Hospital Stay
Admission: EM | Admit: 2022-06-02 | Discharge: 2022-06-04 | DRG: 202 | Disposition: A | Discharge status: Home / Self Care | Payer: BC Managed Care – PPO | Attending: Internal Medicine | Admitting: Internal Medicine

## 2022-06-02 ENCOUNTER — Encounter: Payer: Self-pay | Admitting: Emergency Medicine

## 2022-06-02 DIAGNOSIS — I160 Hypertensive urgency: Secondary | ICD-10-CM

## 2022-06-02 DIAGNOSIS — E785 Hyperlipidemia, unspecified: Secondary | ICD-10-CM | POA: Diagnosis present

## 2022-06-02 DIAGNOSIS — Z90710 Acquired absence of both cervix and uterus: Secondary | ICD-10-CM

## 2022-06-02 DIAGNOSIS — J4551 Severe persistent asthma with (acute) exacerbation: Secondary | ICD-10-CM | POA: Diagnosis present

## 2022-06-02 DIAGNOSIS — Z79899 Other long term (current) drug therapy: Secondary | ICD-10-CM

## 2022-06-02 DIAGNOSIS — F17210 Nicotine dependence, cigarettes, uncomplicated: Secondary | ICD-10-CM | POA: Diagnosis present

## 2022-06-02 DIAGNOSIS — E669 Obesity, unspecified: Secondary | ICD-10-CM | POA: Diagnosis present

## 2022-06-02 DIAGNOSIS — Z20822 Contact with and (suspected) exposure to covid-19: Secondary | ICD-10-CM | POA: Diagnosis present

## 2022-06-02 DIAGNOSIS — E876 Hypokalemia: Secondary | ICD-10-CM | POA: Diagnosis present

## 2022-06-02 DIAGNOSIS — J45901 Unspecified asthma with (acute) exacerbation: Secondary | ICD-10-CM | POA: Diagnosis present

## 2022-06-02 DIAGNOSIS — I10 Essential (primary) hypertension: Secondary | ICD-10-CM | POA: Diagnosis present

## 2022-06-02 DIAGNOSIS — Z6837 Body mass index (BMI) 37.0-37.9, adult: Secondary | ICD-10-CM

## 2022-06-02 DIAGNOSIS — J441 Chronic obstructive pulmonary disease with (acute) exacerbation: Secondary | ICD-10-CM | POA: Diagnosis present

## 2022-06-02 DIAGNOSIS — J45909 Unspecified asthma, uncomplicated: Secondary | ICD-10-CM | POA: Diagnosis present

## 2022-06-02 LAB — CBC WITH DIFFERENTIAL/PLATELET
Abs Immature Granulocytes: 0.05 10*3/uL (ref 0.00–0.07)
Basophils Absolute: 0.1 10*3/uL (ref 0.0–0.1)
Basophils Relative: 1 %
Eosinophils Absolute: 0.2 10*3/uL (ref 0.0–0.5)
Eosinophils Relative: 3 %
HCT: 40.1 % (ref 36.0–46.0)
Hemoglobin: 13.2 g/dL (ref 12.0–15.0)
Immature Granulocytes: 1 %
Lymphocytes Relative: 29 %
Lymphs Abs: 2 10*3/uL (ref 0.7–4.0)
MCH: 27.3 pg (ref 26.0–34.0)
MCHC: 32.9 g/dL (ref 30.0–36.0)
MCV: 82.9 fL (ref 80.0–100.0)
Monocytes Absolute: 0.4 10*3/uL (ref 0.1–1.0)
Monocytes Relative: 7 %
Neutro Abs: 4 10*3/uL (ref 1.7–7.7)
Neutrophils Relative %: 59 %
Platelets: 287 10*3/uL (ref 150–400)
RBC: 4.84 MIL/uL (ref 3.87–5.11)
RDW: 13.6 % (ref 11.5–15.5)
WBC: 6.7 10*3/uL (ref 4.0–10.5)
nRBC: 0 % (ref 0.0–0.2)

## 2022-06-02 LAB — BASIC METABOLIC PANEL
Anion gap: 7 (ref 5–15)
Anion gap: 13 (ref 5–15)
BUN: 13 mg/dL (ref 8–23)
BUN: 12 mg/dL (ref 8–23)
CO2: 25 mmol/L (ref 22–32)
CO2: 20 mmol/L — ABNORMAL LOW (ref 22–32)
Calcium: 9.3 mg/dL (ref 8.9–10.3)
Calcium: 9.2 mg/dL (ref 8.9–10.3)
Chloride: 105 mmol/L (ref 98–111)
Chloride: 107 mmol/L (ref 98–111)
Creatinine, Ser: 0.89 mg/dL (ref 0.44–1.00)
Creatinine, Ser: 0.98 mg/dL (ref 0.44–1.00)
GFR, Estimated: >60 mL/min (ref >60)
GFR, Estimated: >60 mL/min (ref >60)
Glucose, Bld: 110 mg/dL — ABNORMAL HIGH (ref 70–99)
Glucose, Bld: 215 mg/dL — ABNORMAL HIGH (ref 70–99)
Potassium: 3.8 mmol/L (ref 3.5–5.1)
Potassium: 2.9 mmol/L — ABNORMAL LOW (ref 3.5–5.1)
Sodium: 137 mmol/L (ref 135–145)
Sodium: 140 mmol/L (ref 135–145)

## 2022-06-02 LAB — CBC
HCT: 39.3 % (ref 36.0–46.0)
Hemoglobin: 13.4 g/dL (ref 12.0–15.0)
MCH: 27.8 pg (ref 26.0–34.0)
MCHC: 34.1 g/dL (ref 30.0–36.0)
MCV: 81.5 fL (ref 80.0–100.0)
Platelets: 283 10*3/uL (ref 150–400)
RBC: 4.82 MIL/uL (ref 3.87–5.11)
RDW: 13.6 % (ref 11.5–15.5)
WBC: 8.9 10*3/uL (ref 4.0–10.5)
nRBC: 0 % (ref 0.0–0.2)

## 2022-06-02 LAB — TROPONIN I (HIGH SENSITIVITY): Troponin I (High Sensitivity): 3 ng/L (ref <18)

## 2022-06-02 LAB — SARS CORONAVIRUS 2 BY RT PCR: SARS Coronavirus 2 by RT PCR: NEGATIVE

## 2022-06-02 LAB — MAGNESIUM: Magnesium: 2.7 mg/dL — ABNORMAL HIGH (ref 1.7–2.4)

## 2022-06-02 LAB — HIV ANTIBODY (ROUTINE TESTING W REFLEX): HIV Screen 4th Generation wRfx: NONREACTIVE

## 2022-06-02 LAB — BRAIN NATRIURETIC PEPTIDE: B Natriuretic Peptide: 6.2 pg/mL (ref 0.0–100.0)

## 2022-06-02 MED ORDER — POTASSIUM CHLORIDE CRYS ER 20 MEQ PO TBCR
40.0000 meq | EXTENDED_RELEASE_TABLET | ORAL | Status: AC
  Administered 2022-06-02 (×2): 40 meq via ORAL
  Filled 2022-06-02 (×2): qty 2

## 2022-06-02 MED ORDER — IPRATROPIUM-ALBUTEROL 0.5-2.5 (3) MG/3ML IN SOLN
3.0000 mL | Freq: Once | RESPIRATORY_TRACT | Status: AC
  Administered 2022-06-02: 3 mL via RESPIRATORY_TRACT

## 2022-06-02 MED ORDER — METHYLPREDNISOLONE SODIUM SUCC 40 MG IJ SOLR: 40.0000 mg | Freq: Two times a day (BID) | INTRAMUSCULAR | Status: DC

## 2022-06-02 MED ORDER — HYDROCHLOROTHIAZIDE 25 MG PO TABS
25.0000 mg | ORAL_TABLET | Freq: Every day | ORAL | Status: DC
  Administered 2022-06-02 – 2022-06-04 (×3): 25 mg via ORAL
  Filled 2022-06-02 (×3): qty 1

## 2022-06-02 MED ORDER — SODIUM CHLORIDE 0.9 % IV SOLN
1.0000 g | INTRAVENOUS | Status: DC
  Administered 2022-06-02 – 2022-06-04 (×3): 1 g via INTRAVENOUS
  Filled 2022-06-02: qty 10
  Filled 2022-06-02 (×2): qty 1

## 2022-06-02 MED ORDER — METHYLPREDNISOLONE SODIUM SUCC 125 MG IJ SOLR
80.0000 mg | INTRAMUSCULAR | Status: DC
  Administered 2022-06-02: 80 mg via INTRAVENOUS
  Filled 2022-06-02: qty 2

## 2022-06-02 MED ORDER — ONDANSETRON HCL 4 MG PO TABS: 4.0000 mg | ORAL_TABLET | Freq: Four times a day (QID) | ORAL | Status: DC | PRN

## 2022-06-02 MED ORDER — ONDANSETRON HCL 4 MG/2ML IJ SOLN: 4.0000 mg | Freq: Four times a day (QID) | INTRAMUSCULAR | Status: DC | PRN

## 2022-06-02 MED ORDER — SODIUM CHLORIDE 0.9 % IV SOLN: INTRAVENOUS | Status: DC

## 2022-06-02 MED ORDER — AMLODIPINE BESYLATE 10 MG PO TABS
10.0000 mg | ORAL_TABLET | ORAL | Status: DC
  Administered 2022-06-02 – 2022-06-04 (×3): 10 mg via ORAL
  Filled 2022-06-02 (×4): qty 1

## 2022-06-02 MED ORDER — ONDANSETRON 4 MG PO TBDP: 4.0000 mg | ORAL_TABLET | Freq: Three times a day (TID) | ORAL | Status: DC | PRN

## 2022-06-02 MED ORDER — ALBUTEROL SULFATE (2.5 MG/3ML) 0.083% IN NEBU: 2.5000 mg | INHALATION_SOLUTION | RESPIRATORY_TRACT | Status: DC | PRN

## 2022-06-02 MED ORDER — ENOXAPARIN SODIUM 60 MG/0.6ML IJ SOSY
0.5000 mg/kg | PREFILLED_SYRINGE | INTRAMUSCULAR | Status: DC
  Administered 2022-06-02 – 2022-06-04 (×3): 45 mg via SUBCUTANEOUS
  Filled 2022-06-02 (×3): qty 0.6

## 2022-06-02 MED ORDER — GUAIFENESIN ER 600 MG PO TB12
600.0000 mg | ORAL_TABLET | Freq: Two times a day (BID) | ORAL | Status: DC
  Administered 2022-06-02 – 2022-06-03 (×3): 600 mg via ORAL
  Filled 2022-06-02 (×3): qty 1

## 2022-06-02 MED ORDER — ACETAMINOPHEN 325 MG PO TABS
650.0000 mg | ORAL_TABLET | Freq: Four times a day (QID) | ORAL | Status: DC | PRN
  Administered 2022-06-03: 650 mg via ORAL
  Filled 2022-06-02: qty 2

## 2022-06-02 MED ORDER — TRAZODONE HCL 50 MG PO TABS: 25.0000 mg | ORAL_TABLET | Freq: Every evening | ORAL | Status: DC | PRN

## 2022-06-02 MED ORDER — HYDROCOD POLI-CHLORPHE POLI ER 10-8 MG/5ML PO SUER
5.0000 mL | Freq: Two times a day (BID) | ORAL | Status: DC | PRN
  Administered 2022-06-03: 5 mL via ORAL
  Filled 2022-06-02: qty 5

## 2022-06-02 MED ORDER — ALBUTEROL SULFATE (2.5 MG/3ML) 0.083% IN NEBU
5.0000 mg | INHALATION_SOLUTION | Freq: Once | RESPIRATORY_TRACT | Status: AC
  Administered 2022-06-02: 5 mg via RESPIRATORY_TRACT
  Filled 2022-06-02: qty 6

## 2022-06-02 MED ORDER — METHYLPREDNISOLONE SODIUM SUCC 125 MG IJ SOLR
125.0000 mg | Freq: Once | INTRAMUSCULAR | Status: AC
  Administered 2022-06-02: 125 mg via INTRAVENOUS
  Filled 2022-06-02: qty 2

## 2022-06-02 MED ORDER — LORATADINE 10 MG PO TABS
10.0000 mg | ORAL_TABLET | Freq: Every day | ORAL | Status: DC
  Administered 2022-06-02 – 2022-06-04 (×3): 10 mg via ORAL
  Filled 2022-06-02 (×3): qty 1

## 2022-06-02 MED ORDER — MAGNESIUM HYDROXIDE 400 MG/5ML PO SUSP: 30.0000 mL | Freq: Every day | ORAL | Status: DC | PRN

## 2022-06-02 MED ORDER — LOPERAMIDE HCL 2 MG PO CAPS: 2.0000 mg | ORAL_CAPSULE | Freq: Four times a day (QID) | ORAL | Status: DC | PRN

## 2022-06-02 MED ORDER — IPRATROPIUM-ALBUTEROL 0.5-2.5 (3) MG/3ML IN SOLN
3.0000 mL | Freq: Once | RESPIRATORY_TRACT | Status: AC
  Administered 2022-06-02: 3 mL via RESPIRATORY_TRACT
  Filled 2022-06-02: qty 9

## 2022-06-02 MED ORDER — MAGNESIUM SULFATE 2 GM/50ML IV SOLN
2.0000 g | Freq: Once | INTRAVENOUS | Status: AC
  Administered 2022-06-02: 2 g via INTRAVENOUS
  Filled 2022-06-02: qty 50

## 2022-06-02 MED ORDER — PREDNISONE 20 MG PO TABS: 40.0000 mg | ORAL_TABLET | Freq: Every day | ORAL | Status: DC

## 2022-06-02 MED ORDER — IPRATROPIUM-ALBUTEROL 0.5-2.5 (3) MG/3ML IN SOLN
3.0000 mL | Freq: Four times a day (QID) | RESPIRATORY_TRACT | Status: DC
  Administered 2022-06-02 – 2022-06-04 (×8): 3 mL via RESPIRATORY_TRACT
  Filled 2022-06-02 (×8): qty 3

## 2022-06-02 MED ORDER — BUDESONIDE 0.5 MG/2ML IN SUSP
0.5000 mg | Freq: Two times a day (BID) | RESPIRATORY_TRACT | Status: DC
  Administered 2022-06-02 – 2022-06-04 (×5): 0.5 mg via RESPIRATORY_TRACT
  Filled 2022-06-02 (×5): qty 2

## 2022-06-02 MED ORDER — ALBUTEROL SULFATE (2.5 MG/3ML) 0.083% IN NEBU
2.5000 mg | INHALATION_SOLUTION | Freq: Once | RESPIRATORY_TRACT | Status: AC
  Administered 2022-06-02: 2.5 mg via RESPIRATORY_TRACT
  Filled 2022-06-02: qty 3

## 2022-06-02 MED ORDER — ACETAMINOPHEN 650 MG RE SUPP: 650.0000 mg | Freq: Four times a day (QID) | RECTAL | Status: DC | PRN

## 2022-06-02 MED ORDER — HYDROCODONE-ACETAMINOPHEN 5-325 MG PO TABS: 1.0000 | ORAL_TABLET | Freq: Four times a day (QID) | ORAL | Status: DC | PRN

## 2022-06-02 MED ORDER — IPRATROPIUM-ALBUTEROL 0.5-2.5 (3) MG/3ML IN SOLN
3.0000 mL | Freq: Four times a day (QID) | RESPIRATORY_TRACT | Status: DC
  Administered 2022-06-02: 3 mL via RESPIRATORY_TRACT
  Filled 2022-06-02: qty 3

## 2022-06-02 NOTE — Assessment & Plan Note (Signed)
-   We will continue statin therapy. 

## 2022-06-02 NOTE — ED Provider Notes (Signed)
[facility] Provider Note    Event Date/Time   First MD Initiated Contact with Patient 06/02/22 0125     (approximate)   History   Asthma   HPI  Kim Munoz is a 66 y.o. female who reports a past medical history of asthma and hypertension.  She presents for evaluation of gradually worsening shortness of breath and wheezing that has been going on for about 4 days.  She ran out of her albuterol inhaler so she has not been using any breathing treatments.  She said that she has had a cough with some yellow sputum production.  Exertion makes the symptoms worse.  She said that by today she is feeling pressure in her chest which is abnormal.  She reports that the exacerbation feels severe.     Physical Exam   Triage Vital Signs: ED Triage Vitals  Enc Vitals Group     BP 06/02/22 0117 (!) 201/100     Pulse Rate 06/02/22 0117 85     Resp 06/02/22 0117 (!) 28     Temp 06/02/22 0117 98.3 F (36.8 C)     Temp Source 06/02/22 0117 Oral     SpO2 06/02/22 0117 98 %     Weight 06/02/22 0114 89.8 kg (198 lb)     Height 06/02/22 0114 1.524 m (5')     Head Circumference --      Peak Flow --      Pain Score 06/02/22 0115 0     Pain Loc --      Pain Edu? --      Excl. in GC? --     Most recent vital signs: Vitals:   06/02/22 0359 06/02/22 0400  BP:  132/66  Pulse: (!) 109 (!) 112  Resp: 20 (!) 22  Temp:    SpO2: 95% 96%     General: Awake, moderate respiratory distress. CV:  Good peripheral perfusion.  Normal heart sounds. Resp:  Increased respiratory rate and work of breathing.  Extensive audible wheezing even without auscultation; upon auscultation, she has extensive inspiratory and expiratory wheezing with decreased air movement throughout.  She is using accessory muscles and has some mild intercostal retractions.  She is speaking a few words at a time. Abd:  No distention.  Other:  Mood and affect are normal and appropriate under the  circumstances.   ED Results / Procedures / Treatments   Labs (all labs ordered are listed, but only abnormal results are displayed) Labs Reviewed  BASIC METABOLIC PANEL - Abnormal; Notable for the following components:      Result Value   Glucose, Bld 110 (*)    All other components within normal limits  SARS CORONAVIRUS 2 BY RT PCR  BRAIN NATRIURETIC PEPTIDE  CBC WITH DIFFERENTIAL/PLATELET  HIV ANTIBODY (ROUTINE TESTING W REFLEX)  BASIC METABOLIC PANEL  CBC  TROPONIN I (HIGH SENSITIVITY)     EKG  ED ECG REPORT I, Loleta Rose, the attending physician, personally viewed and interpreted this ECG.  Date: 06/02/2022 EKG Time: 1:21 AM Rate: 96 Rhythm: normal sinus rhythm QRS Axis: normal Intervals: normal, QTc 495 ms ST/T Wave abnormalities: Non-specific ST segment / T-wave changes, but no clear evidence of acute ischemia. Narrative Interpretation: no definitive evidence of acute ischemia; does not meet STEMI criteria.    RADIOLOGY See hospital course for details: Chest x-ray is normal.    PROCEDURES:  Critical Care performed: Yes, see critical care procedure note(s)  .1-3 Lead EKG Interpretation  Performed by: Loleta Rose, MD Authorized by: Loleta Rose, MD     Interpretation: normal     ECG rate:  89   ECG rate assessment: normal     Rhythm: sinus rhythm     Ectopy: none     Conduction: normal   .Critical Care  Performed by: Loleta Rose, MD Authorized by: Loleta Rose, MD   Critical care provider statement:    Critical care time (minutes):  40   Critical care time was exclusive of:  Separately billable procedures and treating other patients   Critical care was necessary to treat or prevent imminent or life-threatening deterioration of the following conditions:  Respiratory failure (severe asthma exacerbation)   Critical care was time spent personally by me on the following activities:  Development of treatment plan with patient or surrogate,  evaluation of patient's response to treatment, examination of patient, obtaining history from patient or surrogate, ordering and performing treatments and interventions, ordering and review of laboratory studies, ordering and review of radiographic studies, pulse oximetry, re-evaluation of patient's condition and review of old charts    MEDICATIONS ORDERED IN ED: Medications  magnesium sulfate IVPB 2 g 50 mL (2 g Intravenous New Bag/Given 06/02/22 0341)  HYDROcodone-acetaminophen (NORCO/VICODIN) 5-325 MG per tablet 1 tablet (has no administration in time range)  amLODipine (NORVASC) tablet 10 mg (has no administration in time range)  hydrochlorothiazide (HYDRODIURIL) tablet 25 mg (has no administration in time range)  loperamide (IMODIUM) capsule 2 mg (has no administration in time range)  albuterol (PROVENTIL) (2.5 MG/3ML) 0.083% nebulizer solution 2.5 mg (has no administration in time range)  loratadine (CLARITIN) tablet 10 mg (has no administration in time range)  enoxaparin (LOVENOX) injection 45 mg (has no administration in time range)  0.9 %  sodium chloride infusion (has no administration in time range)  acetaminophen (TYLENOL) tablet 650 mg (has no administration in time range)    Or  acetaminophen (TYLENOL) suppository 650 mg (has no administration in time range)  traZODone (DESYREL) tablet 25 mg (has no administration in time range)  magnesium hydroxide (MILK OF MAGNESIA) suspension 30 mL (has no administration in time range)  ondansetron (ZOFRAN) tablet 4 mg (has no administration in time range)    Or  ondansetron (ZOFRAN) injection 4 mg (has no administration in time range)  cefTRIAXone (ROCEPHIN) 1 g in sodium chloride 0.9 % 100 mL IVPB (has no administration in time range)  methylPREDNISolone sodium succinate (SOLU-MEDROL) 40 mg/mL injection 40 mg (has no administration in time range)    Followed by  predniSONE (DELTASONE) tablet 40 mg (has no administration in time range)   ipratropium-albuterol (DUONEB) 0.5-2.5 (3) MG/3ML nebulizer solution 3 mL (has no administration in time range)  guaiFENesin (MUCINEX) 12 hr tablet 600 mg (has no administration in time range)  chlorpheniramine-HYDROcodone (TUSSIONEX) 10-8 MG/5ML suspension 5 mL (has no administration in time range)  methylPREDNISolone sodium succinate (SOLU-MEDROL) 125 mg/2 mL injection 125 mg (125 mg Intravenous Given 06/02/22 0148)  ipratropium-albuterol (DUONEB) 0.5-2.5 (3) MG/3ML nebulizer solution 3 mL (3 mLs Nebulization Given 06/02/22 0143)  ipratropium-albuterol (DUONEB) 0.5-2.5 (3) MG/3ML nebulizer solution 3 mL (3 mLs Nebulization Given 06/02/22 0148)  ipratropium-albuterol (DUONEB) 0.5-2.5 (3) MG/3ML nebulizer solution 3 mL (3 mLs Nebulization Given 06/02/22 0135)  albuterol (PROVENTIL) (2.5 MG/3ML) 0.083% nebulizer solution 5 mg (5 mg Nebulization Given 06/02/22 0247)  albuterol (PROVENTIL) (2.5 MG/3ML) 0.083% nebulizer solution 2.5 mg (2.5 mg Nebulization Given 06/02/22 0338)     IMPRESSION / MDM / ASSESSMENT  AND PLAN / ED COURSE  I reviewed the triage vital signs and the nursing notes.                              Differential diagnosis includes, but is not limited to, asthma exacerbation, hypertensive urgency, acute onset heart failure with pulmonary edema, respiratory viral infection such as COVID-19, community-acquired pneumonia.  Patient's presentation is most consistent with acute presentation with potential threat to life or bodily function.  The patient is calm and cooperative but is in at least moderate respiratory distress.  Exam is consistent with moderate to severe asthma exacerbation.  She is hypertensive but this is likely due to baseline essential hypertension as well as her current degree of respiratory distress.  Labs/studies ordered: Cardiac monitoring, EKG, chest x-ray, CBC with differential, COVID-19 PCR, BMP, BNP, high-sensitivity troponin (given the feeling of chest  pressure).  Medications ordered: Duo nebs x3, Solu-Medrol 125 mg IV.  We will monitor carefully and reassess after treatment.  The patient is on the cardiac monitor to evaluate for evidence of arrhythmia and/or significant heart rate changes.   Clinical Course as of 06/02/22 0437  Wed Jun 02, 2022  0213 DG Chest Portable 1 View I viewed and interpreted the patient's chest x-ray.  I see no evidence of pneumothorax or pneumonia.  I also read the radiologist's report, which confirmed no acute findings. [CF]  0239 I reassessed the patient.  She says she is feeling a little bit better, but upon auscultation, she still has substantial expiratory wheezing.  I am ordering albuterol 5 mg nebulizer treatment (which is equivalent to 2 breathing treatments). [CF]  0245 CBC with differential is within normal limits, as is basic metabolic panel, BNP, and high-sensitivity troponin.  Given the relatively long-term progression of the symptoms, lack of ischemia on EKG, low risk of ACS based on HEAR score, and normal high-sensitivity troponin, I do not feel that a repeat high-sensitivity troponin is necessary, and I am cancelling the repeat. [CF]  0307 SARS Coronavirus 2 by RT PCR: NEGATIVE [CF]  0335 I reassessed the patient.  She is still mildly tachycardic, likely some insensible volume losses as well as perhaps residual effect from albuterol.  She says that she feels a little bit better but she is still wheezing.  I auscultated again and she still has substantial expiratory wheezes.  At this point I asked her how she is feeling compared to normal and she said that she is afraid to go home given that her symptoms have persisted.  Given that I have provided a total of 5 breathing treatments and her symptoms are still quite noticeable and she still has some accelerated respirations and increased work of breathing, it is very reasonable and appropriate to admit her for asthma exacerbation and ongoing treatments and  monitoring.  I feel that her risk of decompensation at home is substantial.  I am consulting the hospitalist for admission.  I also ordered another albuterol 2.5 mg breathing treatment as well as magnesium 2 g IV to maximize medical therapy. [CF]  0355 Consulted by secure chat text with Dr. Arville Care with the hospitalist service.  Discussed case and plan of care.  He will admit. [CF]    Clinical Course User Index [CF] Loleta Rose, MD     FINAL CLINICAL IMPRESSION(S) / ED DIAGNOSES   Final diagnoses:  Severe persistent asthma with exacerbation     Rx / DC Orders  ED Discharge Orders     None        Note:  This document was prepared using Dragon voice recognition software and may include unintentional dictation errors.   Loleta Rose, MD 06/02/22 [phone]

## 2022-06-02 NOTE — Assessment & Plan Note (Signed)
-   This is likely secondary to acute asthma exacerbation. - Management as above. - We will continue her Norvasc and HCTZ.

## 2022-06-02 NOTE — Progress Notes (Addendum)
Brief same-day note  Patient is a 59 female with history of tobacco use, hypertension, hyperlipidemia, asthma who presented with complaints of wheezing, shortness of breath, yellowish sputum.  Patient is out of her albuterol inhaler.  On presentation, she was hypertensive, tachypneic.  COVID screen test negative.  Chest x-ray showed no pneumonia or pulmonary edema.  Patient admitted for possible asthma/COPD exacerbation.  Started on steroids  Assessment and plan:  Acute exacerbation of intermittent asthma/possible COPD exacerbation: History of asthma but very infrequent.  Suspected COPD due to history of smoking. Wheezing today.  Continue Solu-Medrol.  Not requiring any oxygen.  Added Pulmicort Continue DuoNeb every 6 hours  Hyperlipidemia: Continue statin  Hypertension: Continue Norvasc, hydrochlorothiazide  Smoker: Pack last for 2-3 days.  Counseled cessation  Hypokalemia: Supplemented  Obesity: BMI 37.4

## 2022-06-02 NOTE — Progress Notes (Signed)
Anticoagulation monitoring(Lovenox):  66 yo  female ordered Lovenox 40 mg Q24h    Filed Weights   06/02/22 0114  Weight: 89.8 kg (198 lb)   BMI 38.7    Lab Results  Component Value Date   CREATININE 0.89 06/02/2022   CREATININE 0.74 07/02/2021   CREATININE 0.69 03/25/2021   Estimated Creatinine Clearance: 63.7 mL/min (by C-G formula based on SCr of 0.89 mg/dL). Hemoglobin & Hematocrit     Component Value Date/Time   HGB 13.2 06/02/2022 0139   HCT 40.1 06/02/2022 0139     Per Protocol for Patient with estCrcl > 30 ml/min and BMI > 30, will transition to Lovenox 45 mg Q24h.

## 2022-06-02 NOTE — Plan of Care (Signed)

## 2022-06-02 NOTE — ED Triage Notes (Signed)
Patient ambulatory to triage with steady gait, without difficulty, audible wheezing noted; st since Sunday having wheezing and prod cough yellow sputum; st hx asthma and out of her albuterol inhaler

## 2022-06-02 NOTE — H&P (Signed)
[place] [facility]   PATIENT NAME: Kim Munoz    MR#:  [id number]  DATE OF BIRTH:  1956/02/24  DATE OF ADMISSION:  06/02/2022  PRIMARY CARE PHYSICIAN: [facility]   Patient is coming from: Home  REQUESTING/REFERRING PHYSICIAN: Loleta Rose, MD  CHIEF COMPLAINT:   Chief Complaint  Patient presents with   Asthma    HISTORY OF PRESENT ILLNESS:  Kim Munoz is a 66 y.o. African-American female with medical history significant for ongoing tobacco abuse, hypertension, dyslipidemia and asthma, presented emergency room with acute onset of worsening dyspnea associated with wheezing which have been going on since Sunday with associated cough productive of yellowish sputum.  The patient ran out of her albuterol inhaler and has not had any nebulizer treatment at home.   She had some chest pressure with her symptoms without radiation or nausea or vomiting or diaphoresis.  She denies any hemoptysis or leg pain or.  No recent travels or surgeries.  No nausea or vomiting or abdominal pain.  No dysuria, oliguria or hematuria or flank pain.  She had tactile fever and chills on Sunday.   ED Course: When she came to the ER, BP was 201/100 with respirate of 28 and otherwise normal vital signs.  Labs revealed unremarkable BMP and CBC BNP was 6.2 and high sensitive troponin I was 3.  COVID-19 PCR [place] negative. EKG as reviewed by me : EKG showed sinus rhythm with rate of 96 with poor R wave progression. Imaging: Portable chest x-ray showed no acute cardiopulmonary disease.  The patient was given 3 nebulized DuoNebs, nebulized albuterol twice with 2.5 and 5 mg, IV Solu-Medrol 125 mg and 2 g of IV magnesium sulfate and was still wheezing.  She will be admitted to a medical telemetry bed for further evaluation and management. PAST MEDICAL HISTORY:   Past Medical History:  Diagnosis Date   Complication of anesthesia    nausea after colonoscopy   Family history of adverse reaction to  anesthesia    sister - nausea aafter surgery   Hypertension    Wears dentures    full upper and lower    PAST SURGICAL HISTORY:   Past Surgical History:  Procedure Laterality Date   ABDOMINAL HYSTERECTOMY     COLONOSCOPY     COLONOSCOPY WITH PROPOFOL N/A 01/13/2021   Procedure: COLONOSCOPY WITH PROPOFOL;  Surgeon: Toney Reil, MD;  Location: [facility] ENDOSCOPY;  Service: Gastroenterology;  Laterality: N/A;   GIVENS CAPSULE STUDY N/A 01/27/2021   Procedure: GIVENS CAPSULE STUDY;  Surgeon: Toney Reil, MD;  Location: [facility] ENDOSCOPY;  Service: Gastroenterology;  Laterality: N/A;   HALLUX VALGUS LAPIDUS Right 04/14/2016   Procedure: HALLUX VALGUS LAPIDUS CORRECTION RIGHT FOOT;  Surgeon: Gwyneth Revels, DPM;  Location: [facility];  Service: Podiatry;  Laterality: Right;  WITH POPLITEAL BLOCK   HALLUX VALGUS LAPIDUS Right 08/20/2016   Procedure: HALLUX VALGUS LAPIDUS;  Surgeon: Gwyneth Revels, DPM;  Location: [facility] ORS;  Service: Podiatry;  Laterality: Right;    SOCIAL HISTORY:   Social History   Tobacco Use   Smoking status: Every Day    Packs/day: 0.50    Years: 35.00    Total pack years: 17.50    Types: Cigarettes   Smokeless tobacco: Never  Substance Use Topics   Alcohol use: Yes    Comment: 2 drinks/mo    FAMILY HISTORY:   Family History  Problem Relation Age of Onset   Kidney disease Mother  Thyroid cancer Sister     DRUG ALLERGIES:  No Known Allergies  REVIEW OF SYSTEMS:   ROS As per history of present illness. All pertinent systems were reviewed above. Constitutional, HEENT, cardiovascular, respiratory, GI, GU, musculoskeletal, neuro, psychiatric, endocrine, integumentary and hematologic systems were reviewed and are otherwise negative/unremarkable except for positive findings mentioned above in the HPI.   MEDICATIONS AT HOME:   Prior to Admission medications   Medication Sig Start Date End Date Taking? Authorizing Provider  albuterol  (VENTOLIN HFA) 108 (90 Base) MCG/ACT inhaler Inhale 2-4 puffs by mouth every 4 hours as needed for wheezing, cough, and/or shortness of breath 03/24/20   Hinda Kehr, MD  amLODipine (NORVASC) 10 MG tablet Take 10 mg by mouth every morning.     [provider]  fexofenadine (ALLEGRA) 180 MG tablet Take 1 tablet by mouth daily as needed. 07/07/20 07/07/21  [provider]  hydrochlorothiazide (HYDRODIURIL) 25 MG tablet Take 1 tablet by mouth daily. 01/21/21   [provider]  HYDROcodone-acetaminophen (NORCO) 5-325 MG tablet Take 1 tablet by mouth every 6 (six) hours as needed for moderate pain. 01/08/21   Harvest Dark, MD  loperamide (IMODIUM A-D) 2 MG tablet Take 1 tablet (2 mg total) by mouth 4 (four) times daily as needed for diarrhea or loose stools. 03/25/21   Blake Divine, MD  ondansetron (ZOFRAN ODT) 4 MG disintegrating tablet Take 1 tablet (4 mg total) by mouth every 8 (eight) hours as needed for nausea or vomiting. 03/25/21   Blake Divine, MD      VITAL SIGNS:  Blood pressure 132/66, pulse (!) 112, temperature 98.3 F (36.8 C), temperature source Oral, resp. rate (!) 22, height 5' (1.524 m), weight 89.8 kg, SpO2 96 %.  PHYSICAL EXAMINATION:  Physical Exam  GENERAL:  66 y.o.-year-old F Kim Munoz female patient lying in the bed with mild/moderate distress distress with conversational dyspnea EYES: Pupils equal, round, reactive to light and accommodation. No scleral icterus. Extraocular muscles intact.  HEENT: Head atraumatic, normocephalic. Oropharynx and nasopharynx clear.  NECK:  Supple, no jugular venous distention. No thyroid enlargement, no tenderness.  LUNGS: Diffuse expiratory wheezes with tight expiratory airflow and harsh vesicular breathing.   CARDIOVASCULAR: Regular rate and rhythm, S1, S2 normal. No murmurs, rubs, or gallops.  ABDOMEN: Soft, nondistended, nontender. Bowel sounds present. No organomegaly or mass.  EXTREMITIES: No pedal edema,  cyanosis, or clubbing.  NEUROLOGIC: Cranial nerves II through XII are intact. Muscle strength 5/5 in all extremities. Sensation intact. Gait not checked.  PSYCHIATRIC: The patient is alert and oriented x 3.  Normal affect and good eye contact. SKIN: No obvious rash, lesion, or ulcer.   LABORATORY PANEL:   CBC Recent Labs  Lab 06/02/22 0139  WBC 6.7  HGB 13.2  HCT 40.1  PLT 287   ------------------------------------------------------------------------------------------------------------------  Chemistries  Recent Labs  Lab 06/02/22 0139  NA 137  K 3.8  CL 105  CO2 25  GLUCOSE 110*  BUN 13  CREATININE 0.89  CALCIUM 9.3   ------------------------------------------------------------------------------------------------------------------  Cardiac Enzymes No results for input(s): "TROPONINI" in the last 168 hours. ------------------------------------------------------------------------------------------------------------------  RADIOLOGY:  DG Chest Portable 1 View  Result Date: 06/02/2022 CLINICAL DATA:  Wheezing and cough EXAM: PORTABLE CHEST 1 VIEW COMPARISON:  Radiographs 07/02/2021 FINDINGS: No focal consolidation, pleural effusion, or pneumothorax. Normal cardiomediastinal silhouette. No acute osseous abnormality. Aortic atherosclerotic calcification. IMPRESSION: No active disease. Electronically Signed   By: Placido Sou M.D.   On: 06/02/2022 01:39  IMPRESSION AND PLAN:  Assessment and Plan: * Asthma exacerbation - The patient is a smoker and may be having COPD exacerbation as well. - Patient will be admitted to a medical telemetry bed. - We will continue nebulized albuterol bronchodilator therapy with DuoNebs on a scheduled and as needed basis. - We will continue steroid therapy with IV Solu-Medrol. 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 - She can has no current O2 requirement.  Dyslipidemia - We will  continue statin therapy.  Hypertensive urgency - This is likely secondary to acute asthma exacerbation. - Management as above. - We will continue her Norvasc and HCTZ.    DVT prophylaxis: Lovenox.  Advanced Care Planning:  Code Status: full code.  Family Communication:  The plan of care was discussed in details with the patient (and family). I answered all questions. The patient agreed to proceed with the above mentioned plan. Further management will depend upon hospital course. Disposition Plan: Back to previous home environment Consults called: none.  All the records are reviewed and case discussed with ED provider.  Status is: Inpatient    At the time of the admission, it appears that the appropriate admission status for this patient is inpatient.  This is judged to be reasonable and necessary in order to provide the required intensity of service to ensure the patient's safety given the presenting symptoms, physical exam findings and initial radiographic and laboratory data in the context of comorbid conditions.  The patient requires inpatient status due to high intensity of service, high risk of further deterioration and high frequency of surveillance required.  I certify that at the time of admission, it is my clinical judgment that the patient will require inpatient hospital care extending more than 2 midnights.                            Dispo: The patient is from: Home              Anticipated d/c is to: Home              Patient currently is not medically stable to d/c.              Difficult to place patient: No  Hannah Beat M.D on 06/02/2022 at 4:22 AM  [facility]   From 7 PM-7 AM, contact night-coverage [email or web address]  CC: Primary care physician; [facility]

## 2022-06-02 NOTE — Progress Notes (Signed)
Nutrition Brief Note  RD received consult for nutritional assessment  66 y.o. African-American female with medical history significant for ongoing tobacco abuse, hypertension, dyslipidemia and asthma who is admitted with asthma exacerbation.   Wt Readings from Last 15 Encounters:  06/02/22 89.9 kg  07/02/21 88.9 kg  03/25/21 83.5 kg  02/10/21 85.9 kg  01/13/21 81.6 kg  01/08/21 84.8 kg  09/16/20 82.6 kg  03/23/20 81.6 kg  09/25/17 86.2 kg  01/09/17 84.8 kg  08/20/16 88.5 kg  08/16/16 87.1 kg  06/13/16 87.3 kg  04/14/16 79.4 kg    Body mass index is 37.45 kg/m. Patient meets criteria for obesity based on current BMI.   Current diet order is HH, patient is consuming approximately 50-100% of meals at this time. Labs and medications reviewed.   No nutrition interventions warranted at this time. If nutrition issues arise, please consult RD.   Betsey Holiday MS, RD, LDN Please refer to [facility] for RD and/or RD on-call/weekend/after hours pager

## 2022-06-02 NOTE — Assessment & Plan Note (Addendum)
-   The patient is a smoker and may be having COPD exacerbation as well. - Patient will be admitted to a medical telemetry bed. - We will continue nebulized albuterol bronchodilator therapy with DuoNebs on a scheduled and as needed basis. - We will continue steroid therapy with IV Solu-Medrol. 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 - She can has no current O2 requirement.

## 2022-06-02 NOTE — Evaluation (Signed)
Physical Therapy Evaluation Patient Details Name: Kim Munoz MRN: [id number] DOB: [birth year] Today's Date: 06/02/2022  History of Present Illness  Kim Munoz is a 66 y.o. African-American female with medical history significant for ongoing tobacco abuse, hypertension, dyslipidemia and asthma, presented emergency room with acute onset of worsening dyspnea associated with wheezing which have been going on since Sunday with associated cough productive of yellowish sputum.  The patient ran out of her albuterol inhaler and has not had any nebulizer treatment at home.   Clinical Impression  Patient alert, agreeable to PT. Reported at baseline she is independent. Pt very limited in activity tolerance/endurance today due to wheezing and SOB, some coughing noted as well. Did complain of R sided flank/rib pain called it a "spasm" assisted with stretching and repositioning as able. She performed bed mobility with supervision, sit <> stand with supervision and instructed in potential RW use for energy conservation, though not necessary to improve safety. Pt only able to ambulate ~65ft in the room, CGA.  Overall the patient demonstrated deficits (see "PT Problem List") that impede the patient's functional abilities, safety, and mobility and would benefit from skilled PT intervention. Recommendation at this time is HHPT with intermittent supervision to return pt to PLOF.         Recommendations for follow up therapy are one component of a multi-disciplinary discharge planning process, led by the attending physician.  Recommendations may be updated based on patient status, additional functional criteria and insurance authorization.  Follow Up Recommendations Home health PT      Assistance Recommended at Discharge Intermittent Supervision/Assistance  Patient can return home with the following  A little help with walking and/or transfers;Assistance with cooking/housework;Help with stairs or ramp for  entrance;A little help with bathing/dressing/bathroom    Equipment Recommendations Rollator (4 wheels)  Recommendations for Other Services       Functional Status Assessment Patient has had a recent decline in their functional status and demonstrates the ability to make significant improvements in function in a reasonable and predictable amount of time.     Precautions / Restrictions Precautions Precautions: Fall Restrictions Weight Bearing Restrictions: No      Mobility  Bed Mobility Overal bed mobility: Needs Assistance Bed Mobility: Supine to Sit, Sit to Supine     Supine to sit: Supervision Sit to supine: Supervision        Transfers Overall transfer level: Needs assistance Equipment used: None Transfers: Sit to/from Stand Sit to Stand: Supervision                Ambulation/Gait Ambulation/Gait assistance: Min guard Gait Distance (Feet): 20 Feet Assistive device: None, Rolling walker (2 wheels)         General Gait Details: very limited by wheezing  Stairs            Wheelchair Mobility    Modified Rankin (Stroke Patients Only)       Balance Overall balance assessment: Needs assistance Sitting-balance support: Feet supported, No upper extremity supported Sitting balance-Leahy Scale: Normal       Standing balance-Leahy Scale: Good                               Pertinent Vitals/Pain Pain Assessment Pain Assessment: Faces Faces Pain Scale: Hurts even more Pain Location: R sided flank pain    Home Living Family/patient expects to be discharged to:: Private residence Living Arrangements: Spouse/significant other Available Help at Discharge: Available 24  hours/day;Family Type of Home: House Home Access: Stairs to enter Entrance Stairs-Rails: Lawyer of Steps: 6            Prior Function Prior Level of Function : Independent/Modified Independent;Driving                      Hand Dominance        Extremity/Trunk Assessment   Upper Extremity Assessment Upper Extremity Assessment: Defer to OT evaluation    Lower Extremity Assessment Lower Extremity Assessment: Generalized weakness    Cervical / Trunk Assessment Cervical / Trunk Assessment: Normal  Communication   Communication: No difficulties  Cognition Arousal/Alertness: Awake/alert Behavior During Therapy: WFL for tasks assessed/performed Overall Cognitive Status: Within Functional Limits for tasks assessed                                          General Comments      Exercises     Assessment/Plan    PT Assessment Patient needs continued PT services  PT Problem List Decreased strength;Decreased mobility;Decreased activity tolerance;Decreased balance       PT Treatment Interventions DME instruction;Therapeutic exercise;Gait training;Balance training;Neuromuscular re-education;Stair training;Functional mobility training;Therapeutic activities;Patient/family education    PT Goals (Current goals can be found in the Care Plan section)  Acute Rehab PT Goals Patient Stated Goal: to breathe better PT Goal Formulation: With patient Time For Goal Achievement: 06/16/22 Potential to Achieve Goals: Good    Frequency Min 2X/week     Co-evaluation               AM-PAC PT "6 Clicks" Mobility  Outcome Measure Help needed turning from your back to your side while in a flat bed without using bedrails?: None Help needed moving from lying on your back to sitting on the side of a flat bed without using bedrails?: None Help needed moving to and from a bed to a chair (including a wheelchair)?: None Help needed standing up from a chair using your arms (e.g., wheelchair or bedside chair)?: None Help needed to walk in hospital room?: A Little Help needed climbing 3-5 steps with a railing? : A Little 6 Click Score: 22    End of Session   Activity Tolerance: Other  (comment) (limited by cardiovascular status) Patient left: with call bell/phone within reach;in bed;with bed alarm set Nurse Communication: Mobility status PT Visit Diagnosis: Other abnormalities of gait and mobility (R26.89);Difficulty in walking, not elsewhere classified (R26.2);Muscle weakness (generalized) (M62.81)    Time: 1346-1400 PT Time Calculation (min) (ACUTE ONLY): 14 min   Charges:   PT Evaluation $PT Eval Low Complexity: 1 Low PT Treatments $Therapeutic Activity: 8-22 mins       Kim Munoz PT, DPT 3:45 PM,06/02/22

## 2022-06-02 NOTE — Evaluation (Signed)
Occupational Therapy Evaluation Patient Details Name: Kim Munoz MRN: [id number] DOB: 1956/05/22 Today's Date: 06/02/2022   History of Present Illness Kim Munoz is a 65 y.o. African-American female with medical history significant for ongoing tobacco abuse, hypertension, dyslipidemia and asthma, presented emergency room with acute onset of worsening dyspnea associated with wheezing which have been going on since Sunday with associated cough productive of yellowish sputum.  The patient ran out of her albuterol inhaler and has not had any nebulizer treatment at home   Clinical Impression   Patient presenting with decreased independence in self-care, functional mobility, endurance, and safety. Pt reports she lives at home with her husband. Has 6 steps at entrance of home and railings on both sides. Pt reports that she has difficulty taking care of/keeping up with her grandchildren and completing ADL/IADL tasks around the home due to SOB. Pt takes multiple rest breaks while doing tasks in the home. Patient currently functioning at supervision for bed mobility, transfers sit <>stand, toileting, peri-care, and grooming tasks (washing face, brushing teeth). Pt's HR monitored throughout session (123). Pt left in recliner with call bell in reach and all needs met. Patient will benefit from acute OT to increase overall independence in the areas of ADLs, functional mobility,  in order to safely discharge home.       Recommendations for follow up therapy are one component of a multi-disciplinary discharge planning process, led by the attending physician.  Recommendations may be updated based on patient status, additional functional criteria and insurance authorization.   Follow Up Recommendations  No OT follow up    Assistance Recommended at Discharge PRN  Patient can return home with the following A little help with walking and/or transfers;A little help with bathing/dressing/bathroom    Functional  Status Assessment  Patient has had a recent decline in their functional status and demonstrates the ability to make significant improvements in function in a reasonable and predictable amount of time.  Equipment Recommendations  None recommended by OT       Precautions / Restrictions Precautions Precautions: Fall Restrictions Weight Bearing Restrictions: No      Mobility Bed Mobility Overal bed mobility: Needs Assistance Bed Mobility: Supine to Sit, Sit to Supine     Supine to sit: Supervision Sit to supine: Supervision        Transfers Overall transfer level: Needs assistance Equipment used: None Transfers: Sit to/from Stand Sit to Stand: Supervision                  Balance Overall balance assessment: Needs assistance Sitting-balance support: Feet supported, No upper extremity supported Sitting balance-Leahy Scale: Normal       Standing balance-Leahy Scale: Normal                             ADL either performed or assessed with clinical judgement   ADL Overall ADL's : Needs assistance/impaired     Grooming: Wash/dry face;Supervision/safety                   Toilet Transfer: Warehouse manager and Hygiene: Supervision/safety         General ADL Comments: Supervision for all ADL tasks     Vision Baseline Vision/History: 1 Wears glasses              Pertinent Vitals/Pain Pain Assessment Pain Assessment: No/denies pain     Extremity/Trunk Assessment Upper Extremity Assessment Upper Extremity Assessment:  Overall [facility] for tasks assessed   Lower Extremity Assessment Lower Extremity Assessment: Overall WFL for tasks assessed       Communication Communication Communication: No difficulties   Cognition Arousal/Alertness: Awake/alert Behavior During Therapy: WFL for tasks assessed/performed Overall Cognitive Status: Within Functional Limits for tasks assessed                                                   Home Living Family/patient expects to be discharged to:: Private residence Living Arrangements: Spouse/significant other Available Help at Discharge: Available 24 hours/day;Family Type of Home: House Home Access: Stairs to enter [employer] of Steps: 6 Entrance Stairs-Rails: Left;Right       Bathroom Shower/Tub: Occupational psychologist: Standard                Prior Functioning/Environment Prior Level of Function : Independent/Modified Independent;Driving                        OT Problem List: Decreased safety awareness;Decreased activity tolerance      OT Treatment/Interventions:      OT Goals(Current goals can be found in the care plan section) Acute Rehab OT Goals Patient Stated Goal: complete tasks around home without SOB Time For Goal Achievement: 06/16/22 Potential to Achieve Goals: Good ADL Goals Additional ADL Goal #1: Patient will verbalize 2 energy conservation techniques with Min A to use during ADL tasks in 3 consecutive sessions. Additional ADL Goal #2: Patient will increase activity tolerance to 10 minutes to increase overall safety and independence for selfcare tasks in 3 consecutive sessions. Additional ADL Goal #3: Patient will complete functional activity while standing with supervision for safety, requiring less than 3 therapeutic rest breaks in 3 consecutive sessions.  OT Frequency: Min 2X/week       AM-PAC OT "6 Clicks" Daily Activity     Outcome Measure Help from another person eating meals?: None Help from another person taking care of personal grooming?: None Help from another person toileting, which includes using toliet, bedpan, or urinal?: None Help from another person bathing (including washing, rinsing, drying)?: None Help from another person to put on and taking off regular upper body clothing?: None Help from another person to put on and taking off regular  lower body clothing?: None 6 Click Score: 24   End of Session Nurse Communication: Mobility status;Other (comment) (Nurse was able to confirm breathing treatment schdeule, pt unsure.)  Activity Tolerance: Patient tolerated treatment well Patient left: in chair;with call bell/phone within reach  OT Visit Diagnosis: Other (comment) (SOB, energy conservation)                Time: 6789-3810 OT Time Calculation (min): 13 min Charges:  OT General Charges $OT Visit: 1 Visit OT Evaluation $OT Eval Low Complexity: 1 Low  [place], OTS 06/02/2022, 12:57 PM

## 2022-06-02 NOTE — TOC Initial Note (Signed)
Transition of Care [facility]) - Initial/Assessment Note    Patient Details  Name: Kim Munoz MRN: [id number] Date of Birth: 03/28/1956  [facility]) CM/SW Contact:    Chapman Fitch, RN Phone Number: 06/02/2022, 3:53 PM  Clinical Narrative:                  Admitted [id number] Admitted from: home with husband PCP: [facility]: Denies issues obtaining medications  PT recommending home health.  Discussed recommendations and patient declines services at discharge.  Patient declines rollator.         Patient Goals and CMS Choice        Expected Discharge Plan and Services                                                Prior Living Arrangements/Services                       Activities of Daily Living Home Assistive Devices/Equipment: None ADL Screening (condition at time of admission) Patient's cognitive ability adequate to safely complete daily activities?: Yes Is the patient deaf or have difficulty hearing?: No Does the patient have difficulty seeing, even when wearing glasses/contacts?: No Does the patient have difficulty concentrating, remembering, or making decisions?: No Patient able to express need for assistance with ADLs?: Yes Does the patient have difficulty dressing or bathing?: No Independently performs ADLs?: Yes (appropriate for developmental age) Does the patient have difficulty walking or climbing stairs?: No Weakness of Legs: None Weakness of Arms/Hands: None  Permission Sought/Granted                  Emotional Assessment              Admission diagnosis:  Severe persistent asthma with exacerbation [J45.51] Asthma exacerbation [J45.901] Patient Active Problem List   Diagnosis Date Noted   Asthma exacerbation 06/02/2022   Hypertensive urgency 06/02/2022   Dyslipidemia 06/02/2022   COPD with acute exacerbation (HCC)    Tobacco dependence 02/10/2021   Intractable abdominal pain 01/11/2021    Lung nodule 01/11/2021   Tobacco abuse 01/11/2021   Hypokalemia 01/11/2021   Hypertension    Intractable nausea and vomiting 06/12/2016   Obesity (BMI 30.0-34.9) 05/10/2016   Vaccine counseling 05/10/2016   Hallux valgus of right foot 04/09/2016   PCP:  Marisue Ivan, MD Pharmacy:   [facility] [phone] - [place], [place] - [address] [place] [address] Phone: [phone] Fax: [phone]     Social Determinants of Health (SDOH) Interventions    Readmission Risk Interventions     No data to display

## 2022-06-03 LAB — BASIC METABOLIC PANEL
Anion gap: 10 (ref 5–15)
BUN: 18 mg/dL (ref 8–23)
CO2: 21 mmol/L — ABNORMAL LOW (ref 22–32)
Calcium: 9.4 mg/dL (ref 8.9–10.3)
Chloride: 107 mmol/L (ref 98–111)
Creatinine, Ser: 0.91 mg/dL (ref 0.44–1.00)
GFR, Estimated: >60 mL/min (ref >60)
Glucose, Bld: 197 mg/dL — ABNORMAL HIGH (ref 70–99)
Potassium: 4.2 mmol/L (ref 3.5–5.1)
Sodium: 138 mmol/L (ref 135–145)

## 2022-06-03 MED ORDER — GUAIFENESIN ER 600 MG PO TB12
1200.0000 mg | ORAL_TABLET | Freq: Two times a day (BID) | ORAL | Status: DC
  Administered 2022-06-03 – 2022-06-04 (×2): 1200 mg via ORAL
  Filled 2022-06-03 (×2): qty 2

## 2022-06-03 MED ORDER — METHYLPREDNISOLONE SODIUM SUCC 125 MG IJ SOLR
120.0000 mg | INTRAMUSCULAR | Status: DC
  Administered 2022-06-03: 120 mg via INTRAVENOUS
  Filled 2022-06-03: qty 2

## 2022-06-03 NOTE — Progress Notes (Signed)
PROGRESS NOTE  Kim Munoz  NOB:[id number] DOB: 02/16/1956 DOA: 06/02/2022 PCP: Kim Ivan, MD   Brief Narrative:  Patient is a 42 female with history of tobacco use, hypertension, hyperlipidemia, asthma who presented with complaints of wheezing, shortness of breath, yellowish sputum.  Patient is out of her albuterol inhaler.  On presentation, she was hypertensive, tachypneic.  COVID screen test negative.  Chest x-ray showed no pneumonia or pulmonary edema.  Patient admitted for possible asthma/COPD exacerbation.  Started on steroids.  Hospital course remarkable for persistent wheezing without need of oxygen  Assessment & Plan:  Principal Problem:   Asthma exacerbation Active Problems:   Hypertensive urgency   Dyslipidemia  Acute exacerbation of intermittent asthma/possible COPD exacerbation: History of asthma but very infrequent.  Suspected COPD due to history of smoking. Wheezing today.  Continue Solu-Medrol.  Not requiring any oxygen.  Added Pulmicort Continue DuoNeb every 6 hours, continue mucolytic's She needs inhalers on DC.  We recommend outpatient follow-up with pulmonology for PFT   Hyperlipidemia: Continue statin   Hypertension: Continue Norvasc, hydrochlorothiazide   Smoker: A Pack last for 2-3 days.  Counseled cessation   Hypokalemia: Supplemented and corrected   Obesity: BMI 37.4         DVT prophylaxis:  Lovenox     Code Status: Full Code  Family Communication: None at bedside  Patient status: In patient  Patient is from : Home  Anticipated discharge to: Home  Estimated DC date: 1 to 2 days   Consultants: None  Procedures: None  Antimicrobials:  Anti-infectives (From admission, onward)    Start     Dose/Rate Route Frequency Ordered Stop   06/02/22 0415  cefTRIAXone (ROCEPHIN) 1 g in sodium chloride 0.9 % 100 mL IVPB        1 g 200 mL/hr over 30 Minutes Intravenous Every 24 hours 06/02/22 0412 06/07/22 0414        Subjective: Patient seen and examined at bedside today.  On room air.  She feels better today but is still wheezing.  Her cough has improved  Objective: Vitals:   06/02/22 2038 06/03/22 0446 06/03/22 0746 06/03/22 0835  BP: (!) 152/85 125/68  128/72  Pulse: 87 95  90  Resp: 16 18  16   Temp: 97.6 F (36.4 C) 98 F (36.7 C)  98 F (36.7 C)  TempSrc: Oral Oral  Oral  SpO2: 100% 100% 98% 98%  Weight:      Height:        Intake/Output Summary (Last 24 hours) at 06/03/2022 1057 Last data filed at 06/03/2022 0054 Gross per 24 hour  Intake 1103 ml  Output --  Net 1103 ml   Filed Weights   06/02/22 0114 06/02/22 0552  Weight: 89.8 kg 89.9 kg    Examination:  General exam: Overall comfortable, not in distress, morbidly obese HEENT: PERRL Respiratory system: Bilateral expiratory wheezing Cardiovascular system: S1 & S2 heard, RRR.  Gastrointestinal system: Abdomen is nondistended, soft and nontender. Central nervous system: Alert and oriented Extremities: No edema, no clubbing ,no cyanosis Skin: No rashes, no ulcers,no icterus     Data Reviewed: I have personally reviewed following labs and imaging studies  CBC: Recent Labs  Lab 06/02/22 0139 06/02/22 0612  WBC 6.7 8.9  NEUTROABS 4.0  --   HGB 13.2 13.4  HCT 40.1 39.3  MCV 82.9 81.5  PLT 287 283   Basic Metabolic Panel: Recent Labs  Lab 06/02/22 0139 06/02/22 0612 06/03/22 0514  NA 137 140 138  K 3.8 2.9* 4.2  CL 105 107 107  CO2 25 20* 21*  GLUCOSE 110* 215* 197*  BUN 13 12 18   CREATININE 0.89 0.98 0.91  CALCIUM 9.3 9.2 9.4  MG  --  2.7*  --      Recent Results (from the past 240 hour(s))  SARS Coronavirus 2 by RT PCR (hospital order, performed in [facility] hospital lab) *cepheid single result test* Anterior Nasal Swab     Status: None   Collection Time: 06/02/22  1:39 AM   Specimen: Anterior Nasal Swab  Result Value Ref Range Status   SARS Coronavirus 2 by RT PCR NEGATIVE NEGATIVE Final     Comment: (NOTE) SARS-CoV-2 target nucleic acids are NOT DETECTED.  The SARS-CoV-2 RNA is generally detectable in upper and lower respiratory specimens during the acute phase of infection. The lowest concentration of SARS-CoV-2 viral copies this assay can detect is 250 copies / mL. A negative result does not preclude SARS-CoV-2 infection and should not be used as the sole basis for treatment or other patient management decisions.  A negative result may occur with improper specimen collection / handling, submission of specimen other than nasopharyngeal swab, presence of viral mutation(s) within the areas targeted by this assay, and inadequate number of viral copies (<250 copies / mL). A negative result must be combined with clinical observations, patient history, and epidemiological information.  Fact Sheet for Patients:   06/04/22  Fact Sheet for Healthcare Providers: RoadLapTop.co.za  This test is not yet approved or  cleared by the http://kim-miller.com/ FDA and has been authorized for detection and/or diagnosis of SARS-CoV-2 by FDA under an Emergency Use Authorization (EUA).  This EUA will remain in effect (meaning this test can be used) for the duration of the COVID-19 declaration under Section 564(b)(1) of the Act, 21 U.S.C. section 360bbb-3(b)(1), unless the authorization is terminated or revoked sooner.  Performed at [facility], [address] [place], [place]      Radiology Studies: DG Chest Portable 1 View  Result Date: 06/02/2022 CLINICAL DATA:  Wheezing and cough EXAM: PORTABLE CHEST 1 VIEW COMPARISON:  Radiographs 07/02/2021 FINDINGS: No focal consolidation, pleural effusion, or pneumothorax. Normal cardiomediastinal silhouette. No acute osseous abnormality. Aortic atherosclerotic calcification. IMPRESSION: No active disease. Electronically Signed   By: 07/04/2021 M.D.   On: 06/02/2022 01:39     Scheduled Meds:  amLODipine  10 mg Oral [id number]   budesonide (PULMICORT) nebulizer solution  0.5 mg Nebulization BID   enoxaparin (LOVENOX) injection  0.5 mg/kg Subcutaneous Q24H   guaiFENesin  1,200 mg Oral BID   hydrochlorothiazide  25 mg Oral Daily   ipratropium-albuterol  3 mL Nebulization Q6H   loratadine  10 mg Oral Daily   methylPREDNISolone (SOLU-MEDROL) injection  120 mg Intravenous Q24H   Continuous Infusions:  cefTRIAXone (ROCEPHIN)  IV 1 g (06/03/22 0349)     LOS: 1 day   06/05/22, MD [facility] P9/14/2023, 10:57 AM

## 2022-06-03 NOTE — Progress Notes (Signed)
Physical Therapy Treatment Patient Details Name: Kim Munoz MRN: [id number] DOB: [birth year] Today's Date: 06/03/2022   History of Present Illness Kim Munoz is a 66 y.o. African-American female with medical history significant for ongoing tobacco abuse, hypertension, dyslipidemia and asthma, presented emergency room with acute onset of worsening dyspnea associated with wheezing which have been going on since Sunday with associated cough productive of yellowish sputum.  The patient ran out of her albuterol inhaler and has not had any nebulizer treatment at home    PT Comments    Pt presents to PT in bed and agreeable to participate in therapy services. Pt was pleasant and motivated to participate during the session and put forth good effort throughout. Pt amb well, w no physical assistance required and experienced no LOB. Displays decr endurance and limited primarily by cardiovascular status/coughing. Pt Would benefit from skilled HHPT to address above deficits in endurance and promote optimal return to PLOF.  Recommendations for follow up therapy are one component of a multi-disciplinary discharge planning process, led by the attending physician.  Recommendations may be updated based on patient status, additional functional criteria and insurance authorization.  Follow Up Recommendations  Home health PT     Assistance Recommended at Discharge Intermittent Supervision/Assistance  Patient can return home with the following A little help with walking and/or transfers;Assistance with cooking/housework;Help with stairs or ramp for entrance;A little help with bathing/dressing/bathroom   Equipment Recommendations  Rollator (4 wheels)    Recommendations for Other Services       Precautions / Restrictions Precautions Precautions: Fall Restrictions Weight Bearing Restrictions: No     Mobility  Bed Mobility Overal bed mobility: Needs Assistance Bed Mobility: Supine to Sit, Sit to  Supine     Supine to sit: Supervision Sit to supine: Supervision        Transfers Overall transfer level: Needs assistance Equipment used: None Transfers: Sit to/from Stand Sit to Stand: Supervision                Ambulation/Gait Ambulation/Gait assistance: Min guard Gait Distance (Feet): 40 Feet (required 2 rest breaks during amb, 1 seated) Assistive device: None Gait Pattern/deviations: Step-through pattern, Decreased stride length, Decreased dorsiflexion - right, Decreased dorsiflexion - left Gait velocity: decr     General Gait Details: limited by cough   Stairs             Wheelchair Mobility    Modified Rankin (Stroke Patients Only)       Balance Overall balance assessment: Needs assistance Sitting-balance support: Feet supported, No upper extremity supported Sitting balance-Leahy Scale: Normal       Standing balance-Leahy Scale: Good                              Cognition Arousal/Alertness: Awake/alert Behavior During Therapy: WFL for tasks assessed/performed Overall Cognitive Status: Within Functional Limits for tasks assessed                                          Exercises      General Comments        Pertinent Vitals/Pain Pain Assessment Pain Assessment: No/denies pain Pain Location: R sided flank pain Pain Descriptors / Indicators: Discomfort Pain Intervention(s): Limited activity within patient's tolerance, Monitored during session    Home Living  Prior Function            PT Goals (current goals can now be found in the care plan section) Acute Rehab PT Goals Patient Stated Goal: to breathe better PT Goal Formulation: With patient Time For Goal Achievement: 06/16/22 Potential to Achieve Goals: Good Progress towards PT goals: Progressing toward goals    Frequency    Min 2X/week      PT Plan  Current plan remains appropriate     Co-evaluation              AM-PAC PT "6 Clicks" Mobility   Outcome Measure  Help needed turning from your back to your side while in a flat bed without using bedrails?: None Help needed moving from lying on your back to sitting on the side of a flat bed without using bedrails?: None Help needed moving to and from a bed to a chair (including a wheelchair)?: None Help needed standing up from a chair using your arms (e.g., wheelchair or bedside chair)?: None Help needed to walk in hospital room?: A Little Help needed climbing 3-5 steps with a railing? : A Little 6 Click Score: 22    End of Session Equipment Utilized During Treatment: Gait belt Activity Tolerance: Other (comment) (limited by cardiovascular status) Patient left: with call bell/phone within reach;in bed Nurse Communication: Mobility status PT Visit Diagnosis: Other abnormalities of gait and mobility (R26.89);Difficulty in walking, not elsewhere classified (R26.2);Muscle weakness (generalized) (M62.81)     Time: 3710-6269 PT Time Calculation (min) (ACUTE ONLY): 18 min  Charges:                        Odette Horns MPH, SPT 06/03/22, 3:21 PM

## 2022-06-04 DIAGNOSIS — J45901 Unspecified asthma with (acute) exacerbation: Secondary | ICD-10-CM | POA: Diagnosis not present

## 2022-06-04 LAB — GLUCOSE, CAPILLARY: Glucose-Capillary: 179 mg/dL — ABNORMAL HIGH (ref 70–99)

## 2022-06-04 MED ORDER — PREDNISONE 20 MG PO TABS: 40.0000 mg | ORAL_TABLET | Freq: Every day | ORAL | 0 refills | Status: AC

## 2022-06-04 MED ORDER — IPRATROPIUM-ALBUTEROL 0.5-2.5 (3) MG/3ML IN SOLN: 3.0000 mL | Freq: Four times a day (QID) | RESPIRATORY_TRACT | 0 refills | Status: AC | PRN

## 2022-06-04 MED ORDER — ALBUTEROL SULFATE HFA 108 (90 BASE) MCG/ACT IN AERS: 1.0000 | INHALATION_SPRAY | RESPIRATORY_TRACT | 1 refills | Status: DC | PRN

## 2022-06-04 MED ORDER — DOXYCYCLINE HYCLATE 100 MG PO TBEC: 100.0000 mg | DELAYED_RELEASE_TABLET | Freq: Two times a day (BID) | ORAL | 0 refills | Status: AC

## 2022-06-04 MED ORDER — GUAIFENESIN ER 600 MG PO TB12: 600.0000 mg | ORAL_TABLET | Freq: Two times a day (BID) | ORAL | 0 refills | Status: AC

## 2022-06-04 NOTE — Progress Notes (Signed)
Discharge instructions reviewed with patient. No further questions. PIV removed with no complications. Work note provided. Patient Dcing with husband

## 2022-06-04 NOTE — Discharge Summary (Signed)
Physician Discharge Summary  Kim Munoz DZH:[id number] DOB: 02/16/1956 DOA: 06/02/2022  PCP: Marisue Ivan, MD  Admit date: 06/02/2022 Discharge date: 06/04/2022  Admitted From: Home Disposition:  Home  Discharge Condition:Stable CODE STATUS:FULL Diet recommendation: Heart Healthy   Brief/Interim Summary:  Patient is a 53 female with history of tobacco use, hypertension, hyperlipidemia, asthma who presented with complaints of wheezing, shortness of breath, yellowish sputum.  Patient was out of her albuterol inhaler.  On presentation, she was hypertensive, tachypneic.  COVID screen test negative.  Chest x-ray showed no pneumonia or pulmonary edema.  Patient admitted for possible asthma/COPD exacerbation.  Started on steroids.  Hospital course remarkable for persistent wheezing without need of oxygen.  She feels better today.  Remains on room air.  Stable for discharge to home with oral steroids.  Will recommend to follow-up with her pulmonologist Dr. Meredeth Ide as an outpatient in 1 to 2 weeks.  Following problems were addressed during her hospitalization:    Acute exacerbation of intermittent asthma/possible COPD exacerbation: History of asthma but very infrequent.  Suspected concurrent  COPD due to history of smoking. Wheezing improved now.  Remains on room air. Stable for discharge on prednisone.  DME nebulization machine/solution ordered We recommend outpatient follow-up with pulmonology for PFT   Hyperlipidemia: Continue statin   Hypertension: Continue Norvasc, hydrochlorothiazide   Smoker: A Pack last for 2-3 days.  Counseled for cessation   Hypokalemia: Supplemented and corrected   Obesity: BMI 37.4  Discharge Diagnoses:  Principal Problem:   Asthma exacerbation Active Problems:   Hypertensive urgency   Dyslipidemia    Discharge Instructions  Discharge Instructions     Diet - low sodium heart healthy   Complete by: As directed    Discharge instructions    Complete by: As directed    1)Please take prescribed medications as instructed 2)Follow up with your pulmonologist in 1 to 2 weeks as an outpatient. 3)Please stop smoking   Increase activity slowly   Complete by: As directed       Allergies as of 06/04/2022   No Known Allergies      Medication List     STOP taking these medications    fexofenadine 180 MG tablet Commonly known as: ALLEGRA   HYDROcodone-acetaminophen 5-325 MG tablet Commonly known as: Norco   loperamide 2 MG tablet Commonly known as: Imodium A-D   ondansetron 4 MG disintegrating tablet Commonly known as: Zofran ODT       TAKE these medications    albuterol 108 (90 Base) MCG/ACT inhaler Commonly known as: VENTOLIN HFA Inhale 1 puff into the lungs every 4 (four) hours as needed for wheezing or shortness of breath. Inhale 2-4 puffs by mouth every 4 hours as needed for wheezing, cough, and/or shortness of breath What changed:  how much to take how to take this when to take this reasons to take this   amLODipine 10 MG tablet Commonly known as: NORVASC Take 10 mg by mouth every morning.   doxycycline 100 MG EC tablet Commonly known as: DORYX Take 1 tablet (100 mg total) by mouth 2 (two) times daily for 2 days. Start taking on: June 05, 2022   guaiFENesin 600 MG 12 hr tablet Commonly known as: Mucinex Take 1 tablet (600 mg total) by mouth 2 (two) times daily for 7 days.   hydrochlorothiazide 25 MG tablet Commonly known as: HYDRODIURIL Take 1 tablet by mouth daily.   ipratropium-albuterol 0.5-2.5 (3) MG/3ML Soln Commonly known as: DUONEB Take 3 mLs by  nebulization every 6 (six) hours as needed.   predniSONE 20 MG tablet Commonly known as: DELTASONE Take 2 tablets (40 mg total) by mouth daily for 5 days. Start taking on: June 05, 2022               Durable Medical Equipment  (From admission, onward)           Start     Ordered   06/04/22 1041  For home use only DME  Nebulizer machine  Once       Question Answer Comment  Patient needs a nebulizer to treat with the following condition COPD exacerbation (HCC)   Length of Need Lifetime      06/04/22 1040            Follow-up Information     Mertie Moores, MD. Schedule an appointment as soon as possible for a visit in 1 week(s).   Specialty: Specialist Contact information: [address] [place] [address] [phone]         Marisue Ivan, MD. Schedule an appointment as soon as possible for a visit in 1 week(s).   Specialty: Family Medicine Contact information: [address] [facility] [place] [address] [phone]                No Known Allergies  Consultations: None   Procedures/Studies: DG Chest Portable 1 View  Result Date: 06/02/2022 CLINICAL DATA:  Wheezing and cough EXAM: PORTABLE CHEST 1 VIEW COMPARISON:  Radiographs 07/02/2021 FINDINGS: No focal consolidation, pleural effusion, or pneumothorax. Normal cardiomediastinal silhouette. No acute osseous abnormality. Aortic atherosclerotic calcification. IMPRESSION: No active disease. Electronically Signed   By: Minerva Fester M.D.   On: 06/02/2022 01:39      Subjective: Patient seen and examined at the bedside today.  Hemodynamically stable for discharge.  Discharge Exam: Vitals:   06/04/22 0751 06/04/22 0756  BP: 139/88   Pulse: 93   Resp: 16   Temp: 98.1 F (36.7 C)   SpO2: (!) 89% 100%   Vitals:   06/03/22 1931 06/04/22 0427 06/04/22 0751 06/04/22 0756  BP: (!) 143/88 108/66 139/88   Pulse: 85 80 93   Resp: 20 20 16    Temp: 97.9 F (36.6 C) 98.4 F (36.9 C) 98.1 F (36.7 C)   TempSrc: Oral Oral Oral   SpO2: 100% 98% (!) 89% 100%  Weight:      Height:        General: Pt is alert, awake, not in acute distress Cardiovascular: RRR, S1/S2 +, no rubs, no gallops Respiratory: CTA bilaterally, intermittent mild expiratory wheezing, no rhonchi Abdominal:  Soft, NT, ND, bowel sounds + Extremities: no edema, no cyanosis    The results of significant diagnostics from this hospitalization (including imaging, microbiology, ancillary and laboratory) are listed below for reference.     Microbiology: Recent Results (from the past 240 hour(s))  SARS Coronavirus 2 by RT PCR (hospital order, performed in [facility] hospital lab) *cepheid single result test* Anterior Nasal Swab     Status: None   Collection Time: 06/02/22  1:39 AM   Specimen: Anterior Nasal Swab  Result Value Ref Range Status   SARS Coronavirus 2 by RT PCR NEGATIVE NEGATIVE Final    Comment: (NOTE) SARS-CoV-2 target nucleic acids are NOT DETECTED.  The SARS-CoV-2 RNA is generally detectable in upper and lower respiratory specimens during the acute phase of infection. The lowest concentration of SARS-CoV-2 viral copies this assay can detect is  250 copies / mL. A negative result does not preclude SARS-CoV-2 infection and should not be used as the sole basis for treatment or other patient management decisions.  A negative result may occur with improper specimen collection / handling, submission of specimen other than nasopharyngeal swab, presence of viral mutation(s) within the areas targeted by this assay, and inadequate number of viral copies (<250 copies / mL). A negative result must be combined with clinical observations, patient history, and epidemiological information.  Fact Sheet for Patients:   RoadLapTop.co.za  Fact Sheet for Healthcare Providers: http://kim-miller.com/  This test is not yet approved or  cleared by the [place] FDA and has been authorized for detection and/or diagnosis of SARS-CoV-2 by FDA under an Emergency Use Authorization (EUA).  This EUA will remain in effect (meaning this test can be used) for the duration of the COVID-19 declaration under Section 564(b)(1) of the Act, 21 U.S.C. section  360bbb-3(b)(1), unless the authorization is terminated or revoked sooner.  Performed at [facility], [address] [place], [place] [address]      Labs: BNP (last 3 results) Recent Labs    06/02/22 0139  BNP 6.2   Basic Metabolic Panel: Recent Labs  Lab 06/02/22 0139 06/02/22 0612 06/03/22 0514  NA 137 140 138  K 3.8 2.9* 4.2  CL 105 107 107  CO2 25 20* 21*  GLUCOSE 110* 215* 197*  BUN 13 12 18   CREATININE 0.89 0.98 0.91  CALCIUM 9.3 9.2 9.4  MG  --  2.7*  --    Liver Function Tests: No results for input(s): "AST", "ALT", "ALKPHOS", "BILITOT", "PROT", "ALBUMIN" in the last 168 hours. No results for input(s): "LIPASE", "AMYLASE" in the last 168 hours. No results for input(s): "AMMONIA" in the last 168 hours. CBC: Recent Labs  Lab 06/02/22 0139 06/02/22 0612  WBC 6.7 8.9  NEUTROABS 4.0  --   HGB 13.2 13.4  HCT 40.1 39.3  MCV 82.9 81.5  PLT 287 283   Cardiac Enzymes: No results for input(s): "CKTOTAL", "CKMB", "CKMBINDEX", "TROPONINI" in the last 168 hours. BNP: Invalid input(s): "POCBNP" CBG: Recent Labs  Lab 06/04/22 0749  GLUCAP 179*   D-Dimer No results for input(s): "DDIMER" in the last 72 hours. Hgb A1c No results for input(s): "HGBA1C" in the last 72 hours. Lipid Profile No results for input(s): "CHOL", "HDL", "LDLCALC", "TRIG", "CHOLHDL", "LDLDIRECT" in the last 72 hours. Thyroid function studies No results for input(s): "TSH", "T4TOTAL", "T3FREE", "THYROIDAB" in the last 72 hours.  Invalid input(s): "FREET3" Anemia work up No results for input(s): "VITAMINB12", "FOLATE", "FERRITIN", "TIBC", "IRON", "RETICCTPCT" in the last 72 hours. Urinalysis    Component Value Date/Time   COLORURINE YELLOW (A) 03/25/2021 1521   APPEARANCEUR HAZY (A) 03/25/2021 1521   LABSPEC 1.015 03/25/2021 1521   PHURINE 5.0 03/25/2021 1521   GLUCOSEU NEGATIVE 03/25/2021 1521   HGBUR SMALL (A) 03/25/2021 1521   BILIRUBINUR NEGATIVE 03/25/2021 1521    KETONESUR 20 (A) 03/25/2021 1521   PROTEINUR 30 (A) 03/25/2021 1521   NITRITE NEGATIVE 03/25/2021 1521   LEUKOCYTESUR NEGATIVE 03/25/2021 1521   Sepsis Labs Recent Labs  Lab 06/02/22 0139 06/02/22 0612  WBC 6.7 8.9   Microbiology Recent Results (from the past 240 hour(s))  SARS Coronavirus 2 by RT PCR (hospital order, performed in [facility] hospital lab) *cepheid single result test* Anterior Nasal Swab     Status: None   Collection Time: 06/02/22  1:39 AM   Specimen: Anterior Nasal Swab  Result Value Ref Range Status   SARS Coronavirus 2 by RT PCR NEGATIVE NEGATIVE Final    Comment: (NOTE) SARS-CoV-2 target nucleic acids are NOT DETECTED.  The SARS-CoV-2 RNA is generally detectable in upper and lower respiratory specimens during the acute phase of infection. The lowest concentration of SARS-CoV-2 viral copies this assay can detect is 250 copies / mL. A negative result does not preclude SARS-CoV-2 infection and should not be used as the sole basis for treatment or other patient management decisions.  A negative result may occur with improper specimen collection / handling, submission of specimen other than nasopharyngeal swab, presence of viral mutation(s) within the areas targeted by this assay, and inadequate number of viral copies (<250 copies / mL). A negative result must be combined with clinical observations, patient history, and epidemiological information.  Fact Sheet for Patients:   RoadLapTop.co.za  Fact Sheet for Healthcare Providers: http://kim-miller.com/  This test is not yet approved or  cleared by the [place] FDA and has been authorized for detection and/or diagnosis of SARS-CoV-2 by FDA under an Emergency Use Authorization (EUA).  This EUA will remain in effect (meaning this test can be used) for the duration of the COVID-19 declaration under Section 564(b)(1) of the Act, 21 U.S.C. section 360bbb-3(b)(1),  unless the authorization is terminated or revoked sooner.  Performed at [facility], [address] [place], [place] [address]     Please note: You were cared for by a hospitalist during your hospital stay. Once you are discharged, your primary care physician will handle any further medical issues. Please note that NO REFILLS for any discharge medications will be authorized once you are discharged, as it is imperative that you return to your primary care physician (or establish a relationship with a primary care physician if you do not have one) for your post hospital discharge needs so that they can reassess your need for medications and monitor your lab values.    Time coordinating discharge: 40 minutes  SIGNED:   Burnadette Pop, MD  [facility] 06/04/2022, 10:52 AM Pager [phone]  If 7PM-7AM, please contact night-coverage [email or web address] Password [email or web address]

## 2022-06-04 NOTE — Progress Notes (Signed)
Occupational Therapy Treatment Patient Details Name: Kim Munoz MRN: [id number] DOB: Jul 22, 1956 Today's Date: 06/04/2022   History of present illness Kim Munoz is a 66 y.o. African-American female with medical history significant for ongoing tobacco abuse, hypertension, dyslipidemia and asthma, presented emergency room with acute onset of worsening dyspnea associated with wheezing which have been going on since Sunday with associated cough productive of yellowish sputum.  The patient ran out of her albuterol inhaler and has not had any nebulizer treatment at home   OT comments  Upon entering the room, pt supine in bed with visitor present in room. Pt declines mobility and reports she is being discharged from hospital today. She is agreeable to energy conservation education. OT educated pt on general principles for energy conservation for self care, community tasks, and work with pt being an active participate and asking questions as needed. Energy handout provided with greater detail for pt to take home. Pt remains in bed at end of session.    Recommendations for follow up therapy are one component of a multi-disciplinary discharge planning process, led by the attending physician.  Recommendations may be updated based on patient status, additional functional criteria and insurance authorization.    Follow Up Recommendations  No OT follow up    Assistance Recommended at Discharge PRN  Patient can return home with the following  A little help with walking and/or transfers;A little help with bathing/dressing/bathroom   Equipment Recommendations  None recommended by OT       Precautions / Restrictions Precautions Precautions: Fall Restrictions Weight Bearing Restrictions: No       Mobility Bed Mobility                    Transfers                             ADL either performed or assessed with clinical judgement      Cognition Arousal/Alertness:  Awake/alert Behavior During Therapy: WFL for tasks assessed/performed Overall Cognitive Status: Within Functional Limits for tasks assessed                                                     Pertinent Vitals/ Pain       Pain Assessment Pain Assessment: No/denies pain         Frequency  Min 2X/week        Progress Toward Goals  OT Goals(current goals can now be found in the care plan section)  Progress towards OT goals: Progressing toward goals  Acute Rehab OT Goals Patient Stated Goal: go home Time For Goal Achievement: 06/16/22 Potential to Achieve Goals: Good  Plan Discharge plan remains appropriate;Frequency remains appropriate       AM-PAC OT "6 Clicks" Daily Activity     Outcome Measure   Help from another person eating meals?: None Help from another person taking care of personal grooming?: None Help from another person toileting, which includes using toliet, bedpan, or urinal?: None Help from another person bathing (including washing, rinsing, drying)?: None Help from another person to put on and taking off regular upper body clothing?: None Help from another person to put on and taking off regular lower body clothing?: None 6 Click Score: 24    End of Session  OT Visit Diagnosis: Muscle weakness (generalized) (M62.81)   Activity Tolerance Patient tolerated treatment well   Patient Left in bed;with call bell/phone within reach;with bed alarm set   Nurse Communication          Time: 0370-4888 OT Time Calculation (min): 9 min  Charges: OT General Charges $OT Visit: 1 Visit OT Treatments $Therapeutic Activity: 8-22 mins  Jackquline Denmark, MS, OTR/L , CBIS ascom [phone]  06/04/22, 12:23 PM

## 2022-09-20 DIAGNOSIS — I214 Non-ST elevation (NSTEMI) myocardial infarction: Secondary | ICD-10-CM

## 2022-09-20 HISTORY — DX: Non-ST elevation (NSTEMI) myocardial infarction: I21.4

## 2023-01-04 ENCOUNTER — Emergency Department: Payer: Self-pay

## 2023-01-04 ENCOUNTER — Other Ambulatory Visit: Payer: Self-pay

## 2023-01-04 ENCOUNTER — Emergency Department
Admission: EM | Admit: 2023-01-04 | Discharge: 2023-01-04 | Disposition: A | Discharge status: Home / Self Care | Payer: Self-pay | Attending: Emergency Medicine | Admitting: Emergency Medicine

## 2023-01-04 ENCOUNTER — Encounter: Payer: Self-pay | Admitting: Radiology

## 2023-01-04 DIAGNOSIS — Z720 Tobacco use: Secondary | ICD-10-CM | POA: Insufficient documentation

## 2023-01-04 DIAGNOSIS — J45909 Unspecified asthma, uncomplicated: Secondary | ICD-10-CM | POA: Insufficient documentation

## 2023-01-04 DIAGNOSIS — K5000 Crohn's disease of small intestine without complications: Secondary | ICD-10-CM | POA: Insufficient documentation

## 2023-01-04 DIAGNOSIS — K50019 Crohn's disease of small intestine with unspecified complications: Secondary | ICD-10-CM

## 2023-01-04 DIAGNOSIS — E876 Hypokalemia: Secondary | ICD-10-CM | POA: Insufficient documentation

## 2023-01-04 DIAGNOSIS — I10 Essential (primary) hypertension: Secondary | ICD-10-CM | POA: Insufficient documentation

## 2023-01-04 LAB — CBC
HCT: 42.5 % (ref 36.0–46.0)
Hemoglobin: 14.3 g/dL (ref 12.0–15.0)
MCH: 27.1 pg (ref 26.0–34.0)
MCHC: 33.6 g/dL (ref 30.0–36.0)
MCV: 80.5 fL (ref 80.0–100.0)
Platelets: 333 10*3/uL (ref 150–400)
RBC: 5.28 MIL/uL — ABNORMAL HIGH (ref 3.87–5.11)
RDW: 15 % (ref 11.5–15.5)
WBC: 7.5 10*3/uL (ref 4.0–10.5)
nRBC: 0 % (ref 0.0–0.2)

## 2023-01-04 LAB — COMPREHENSIVE METABOLIC PANEL
ALT: 11 U/L (ref 0–44)
AST: 12 U/L — ABNORMAL LOW (ref 15–41)
Albumin: 3.5 g/dL (ref 3.5–5.0)
Alkaline Phosphatase: 29 U/L — ABNORMAL LOW (ref 38–126)
Anion gap: 10 (ref 5–15)
BUN: 10 mg/dL (ref 8–23)
CO2: 23 mmol/L (ref 22–32)
Calcium: 9.3 mg/dL (ref 8.9–10.3)
Chloride: 107 mmol/L (ref 98–111)
Creatinine, Ser: 0.89 mg/dL (ref 0.44–1.00)
GFR, Estimated: >60 mL/min (ref >60)
Glucose, Bld: 104 mg/dL — ABNORMAL HIGH (ref 70–99)
Potassium: 3.4 mmol/L — ABNORMAL LOW (ref 3.5–5.1)
Sodium: 140 mmol/L (ref 135–145)
Total Bilirubin: 1.2 mg/dL (ref 0.3–1.2)
Total Protein: 7.5 g/dL (ref 6.5–8.1)

## 2023-01-04 LAB — URINALYSIS, ROUTINE W REFLEX MICROSCOPIC
Bilirubin Urine: NEGATIVE
Glucose, UA: NEGATIVE mg/dL
Hgb urine dipstick: NEGATIVE
Ketones, ur: 5 mg/dL — AB
Leukocytes,Ua: NEGATIVE
Nitrite: NEGATIVE
Protein, ur: 100 mg/dL — AB
Specific Gravity, Urine: 1.015 (ref 1.005–1.030)
pH: 5 (ref 5.0–8.0)

## 2023-01-04 LAB — LIPASE, BLOOD: Lipase: 33 U/L (ref 11–51)

## 2023-01-04 MED ORDER — CIPROFLOXACIN HCL 500 MG PO TABS
500.0000 mg | ORAL_TABLET | Freq: Once | ORAL | Status: AC
  Administered 2023-01-04: 500 mg via ORAL
  Filled 2023-01-04: qty 1

## 2023-01-04 MED ORDER — IOHEXOL 300 MG/ML  SOLN
100.0000 mL | Freq: Once | INTRAMUSCULAR | Status: AC | PRN
  Administered 2023-01-04: 100 mL via INTRAVENOUS

## 2023-01-04 MED ORDER — HYDROMORPHONE HCL 1 MG/ML IJ SOLN
0.5000 mg | Freq: Once | INTRAMUSCULAR | Status: AC
  Administered 2023-01-04: 0.5 mg via INTRAVENOUS
  Filled 2023-01-04: qty 0.5

## 2023-01-04 MED ORDER — ONDANSETRON HCL 4 MG/2ML IJ SOLN
4.0000 mg | Freq: Once | INTRAMUSCULAR | Status: AC
  Administered 2023-01-04: 4 mg via INTRAVENOUS
  Filled 2023-01-04: qty 2

## 2023-01-04 MED ORDER — CIPROFLOXACIN HCL 500 MG PO TABS: 500.0000 mg | ORAL_TABLET | Freq: Two times a day (BID) | ORAL | 0 refills | Status: AC

## 2023-01-04 MED ORDER — ONDANSETRON 4 MG PO TBDP: 4.0000 mg | ORAL_TABLET | Freq: Three times a day (TID) | ORAL | 0 refills | Status: DC | PRN

## 2023-01-04 MED ORDER — LACTATED RINGERS IV BOLUS
1000.0000 mL | Freq: Once | INTRAVENOUS | Status: AC
  Administered 2023-01-04: 1000 mL via INTRAVENOUS

## 2023-01-04 NOTE — ED Notes (Signed)
Discharge instructions reviewed with patient. Patient questions answered and opportunity for education reviewed. Patient voices understanding of discharge instructions with no further questions. Patient ambulatory with steady gait to lobby.  

## 2023-01-04 NOTE — ED Triage Notes (Signed)
Pt to ED via POV from home. Pt ambulatory to triage. Pt reports N/V/D and abdominal pain that started Sunday. Pt denies sick contacts.

## 2023-01-04 NOTE — ED Provider Notes (Signed)
[facility] Provider Note    Event Date/Time   First MD Initiated Contact with Patient 01/04/23 [phone]     (approximate)   History   Abdominal Pain   HPI  Kim Munoz is a 67 y.o. female with past medical history of hypertension hyperlipidemia asthma tobacco use who presents with nausea vomiting and diarrhea.  Patient symptoms started on Sunday, 3 days ago.  Started with diarrhea she has had about 10 episodes of nonbloody diarrhea per day.  Also had vomiting 3 episodes total but ongoing nausea.  She is still able to drink but is feeling this induces her diarrhea.  She has also had diffuse abdominal pain that is cramping throughout the abdomen comes and goes not associated with the vomiting or diarrhea.  Denies fevers chills.  Her husband is sick with similar illness.  Denies cough congestion chest pain dyspnea.     Past Medical History:  Diagnosis Date   Complication of anesthesia    nausea after colonoscopy   Family history of adverse reaction to anesthesia    sister - nausea aafter surgery   Hypertension    Wears dentures    full upper and lower    Patient Active Problem List   Diagnosis Date Noted   Asthma exacerbation 06/02/2022   Hypertensive urgency 06/02/2022   Dyslipidemia 06/02/2022   COPD with acute exacerbation    Tobacco dependence 02/10/2021   Intractable abdominal pain 01/11/2021   Lung nodule 01/11/2021   Tobacco abuse 01/11/2021   Hypokalemia 01/11/2021   Hypertension    Intractable nausea and vomiting 06/12/2016   Obesity (BMI 30.0-34.9) 05/10/2016   Vaccine counseling 05/10/2016   Hallux valgus of right foot 04/09/2016     Physical Exam  Triage Vital Signs: ED Triage Vitals  Enc Vitals Group     BP      Pulse      Resp      Temp      Temp src      SpO2      Weight      Height      Head Circumference      Peak Flow      Pain Score      Pain Loc      Pain Edu?      Excl. in GC?     Most recent vital  signs: Vitals:   01/04/23 0833  BP: (!) 173/101  Pulse: 86  Resp: 18  Temp: 98.5 F (36.9 C)  SpO2: 95%     General: Awake, patient looks uncomfortable but nontoxic CV:  Good peripheral perfusion.  Resp:  Normal effort.  Abd:  No distention.  Diffusely tender but soft Neuro:             Awake, Alert, Oriented x 3  Other:  Mucous membranes are dry   ED Results / Procedures / Treatments  Labs (all labs ordered are listed, but only abnormal results are displayed) Labs Reviewed  COMPREHENSIVE METABOLIC PANEL - Abnormal; Notable for the following components:      Result Value   Potassium 3.4 (*)    Glucose, Bld 104 (*)    AST 12 (*)    Alkaline Phosphatase 29 (*)    All other components within normal limits  CBC - Abnormal; Notable for the following components:   RBC 5.28 (*)    All other components within normal limits  URINALYSIS, ROUTINE W REFLEX MICROSCOPIC - Abnormal; Notable for the  following components:   Color, Urine YELLOW (*)    APPearance CLOUDY (*)    Ketones, ur 5 (*)    Protein, ur 100 (*)    Bacteria, UA RARE (*)    All other components within normal limits  LIPASE, BLOOD     EKG     RADIOLOGY I reviewed and interpreted CT of the abdomen pelvis which shows inflammation of the terminal ileum   PROCEDURES:  Critical Care performed: No  Procedures     MEDICATIONS ORDERED IN ED: Medications  ciprofloxacin (CIPRO) tablet 500 mg (has no administration in time range)  lactated ringers bolus 1,000 mL (1,000 mLs Intravenous New Bag/Given 01/04/23 0903)  ondansetron (ZOFRAN) injection 4 mg (4 mg Intravenous Given 01/04/23 0904)  HYDROmorphone (DILAUDID) injection 0.5 mg (0.5 mg Intravenous Given 01/04/23 0905)  iohexol (OMNIPAQUE) 300 MG/ML solution 100 mL (100 mLs Intravenous Contrast Given 01/04/23 1010)     IMPRESSION / MDM / ASSESSMENT AND PLAN / ED COURSE  I reviewed the triage vital signs and the nursing notes.                               Patient's presentation is most consistent with acute complicated illness / injury requiring diagnostic workup.  Differential diagnosis includes, but is not limited to, viral gastroenteritis, diverticulitis, appendicitis, bowel obstruction, electrolyte abnormality, dehydration   Patient is a 67 year old female presents with nausea vomiting diarrhea and abdominal pain.  This been going on for the last 3 days.  Diarrhea is nonbloody she has had 3 episodes of emesis total but ongoing nausea.  Still able to drink.  Abdominal pain is cramping and rather diffuse.  Patient hypertensive on arrival vitals otherwise reassuring.  Does look somewhat uncomfortable and is intermittently grimacing but she is having abdominal cramping.  Mucous membranes are dry.  Abdominal exam overall is benign although she is diffusely tender but is soft there is no guarding.  Overall I suspect this is likely a viral gastroenteritis especially as her husband has similar symptoms but given her degree of pain will obtain a CT of the abdomen pelvis obtain labs will give a bolus of fluids Zofran Dilaudid for pain.  Patient's labs are overall reassuring she has no leukocytosis.  Mildly hypokalemic with potassium 3.4.  LFTs and lipase are normal.  CT of the abdomen pelvis shows severe inflammation of the ileum specifically the terminal ileum concerning for Crohn's disease.  Patient has had colonoscopy 2 years ago that was normal.  No history of IBD.  Given her other symptoms I am suspicious that this is infectious.  Patient unable to give stool sample in the ED unfortunately.  Stool is nonbloody.  Given her age and the severity of inflammation on CT will give a course of ciprofloxacin.  Discussed importance of GI follow-up.  We discussed return precautions.  Given patient pain is controlled at this time she is tolerating p.o. I do think she can be discharged.      FINAL CLINICAL IMPRESSION(S) / ED DIAGNOSES   Final diagnoses:   Terminal ileitis with complication     Rx / DC Orders   ED Discharge Orders          Ordered    ciprofloxacin (CIPRO) 500 MG tablet  2 times daily        01/04/23 1104    ondansetron (ZOFRAN-ODT) 4 MG disintegrating tablet  Every 8 hours PRN  01/04/23 1104             Note:  This document was prepared using Dragon voice recognition software and may include unintentional dictation errors.   Georga Hacking, MD 01/04/23 [phone]

## 2023-01-04 NOTE — Discharge Instructions (Addendum)
Your blood work was reassuring.  Your CAT scan shows that you have inflammation of your small bowel which could be from infection or could also be from inflammatory bowel disease such as Crohn's disease.  Please take the antibiotics twice a day for the next 5 days to treat possible infection.  You can take the Zofran as needed for nausea and vomiting.  You can take Tylenol for pain.  If your pain is worsening you develop fevers or not able to keep fluids down then please return to the emergency department.  Otherwise it is important you follow-up with your gastroenterologist as he may need to have another colonoscopy after this illness resolves.

## 2023-01-15 ENCOUNTER — Other Ambulatory Visit: Payer: Self-pay

## 2023-01-15 ENCOUNTER — Emergency Department: Payer: Self-pay

## 2023-01-15 ENCOUNTER — Inpatient Hospital Stay: Payer: Self-pay

## 2023-01-15 ENCOUNTER — Encounter: Payer: Self-pay | Admitting: Emergency Medicine

## 2023-01-15 ENCOUNTER — Inpatient Hospital Stay
Admission: EM | Admit: 2023-01-15 | Discharge: 2023-01-17 | DRG: 287 | Disposition: A | Discharge status: Home / Self Care | Payer: Self-pay | Attending: Hospitalist | Admitting: Hospitalist

## 2023-01-15 DIAGNOSIS — Z808 Family history of malignant neoplasm of other organs or systems: Secondary | ICD-10-CM

## 2023-01-15 DIAGNOSIS — R079 Chest pain, unspecified: Secondary | ICD-10-CM

## 2023-01-15 DIAGNOSIS — I214 Non-ST elevation (NSTEMI) myocardial infarction: Secondary | ICD-10-CM | POA: Insufficient documentation

## 2023-01-15 DIAGNOSIS — Z841 Family history of disorders of kidney and ureter: Secondary | ICD-10-CM

## 2023-01-15 DIAGNOSIS — E785 Hyperlipidemia, unspecified: Secondary | ICD-10-CM | POA: Diagnosis present

## 2023-01-15 DIAGNOSIS — H538 Other visual disturbances: Secondary | ICD-10-CM | POA: Diagnosis present

## 2023-01-15 DIAGNOSIS — E119 Type 2 diabetes mellitus without complications: Secondary | ICD-10-CM | POA: Diagnosis present

## 2023-01-15 DIAGNOSIS — Z8249 Family history of ischemic heart disease and other diseases of the circulatory system: Secondary | ICD-10-CM

## 2023-01-15 DIAGNOSIS — J45909 Unspecified asthma, uncomplicated: Secondary | ICD-10-CM | POA: Diagnosis present

## 2023-01-15 DIAGNOSIS — E669 Obesity, unspecified: Secondary | ICD-10-CM | POA: Diagnosis present

## 2023-01-15 DIAGNOSIS — I2489 Other forms of acute ischemic heart disease: Secondary | ICD-10-CM | POA: Diagnosis present

## 2023-01-15 DIAGNOSIS — Z6837 Body mass index (BMI) 37.0-37.9, adult: Secondary | ICD-10-CM

## 2023-01-15 DIAGNOSIS — F17210 Nicotine dependence, cigarettes, uncomplicated: Secondary | ICD-10-CM | POA: Diagnosis present

## 2023-01-15 DIAGNOSIS — J4540 Moderate persistent asthma, uncomplicated: Secondary | ICD-10-CM

## 2023-01-15 DIAGNOSIS — E876 Hypokalemia: Secondary | ICD-10-CM | POA: Diagnosis present

## 2023-01-15 DIAGNOSIS — I161 Hypertensive emergency: Principal | ICD-10-CM

## 2023-01-15 DIAGNOSIS — I10 Essential (primary) hypertension: Secondary | ICD-10-CM | POA: Diagnosis present

## 2023-01-15 DIAGNOSIS — R7989 Other specified abnormal findings of blood chemistry: Secondary | ICD-10-CM

## 2023-01-15 DIAGNOSIS — J4489 Other specified chronic obstructive pulmonary disease: Secondary | ICD-10-CM | POA: Diagnosis present

## 2023-01-15 DIAGNOSIS — Z79899 Other long term (current) drug therapy: Secondary | ICD-10-CM

## 2023-01-15 DIAGNOSIS — R7303 Prediabetes: Secondary | ICD-10-CM | POA: Diagnosis present

## 2023-01-15 LAB — URINALYSIS, ROUTINE W REFLEX MICROSCOPIC
Bilirubin Urine: NEGATIVE
Glucose, UA: NEGATIVE mg/dL
Hgb urine dipstick: NEGATIVE
Ketones, ur: NEGATIVE mg/dL
Leukocytes,Ua: NEGATIVE
Nitrite: NEGATIVE
Protein, ur: NEGATIVE mg/dL
Specific Gravity, Urine: 1.002 — ABNORMAL LOW (ref 1.005–1.030)
pH: 6 (ref 5.0–8.0)

## 2023-01-15 LAB — COMPREHENSIVE METABOLIC PANEL
ALT: 14 U/L (ref 0–44)
AST: 16 U/L (ref 15–41)
Albumin: 3.4 g/dL — ABNORMAL LOW (ref 3.5–5.0)
Alkaline Phosphatase: 28 U/L — ABNORMAL LOW (ref 38–126)
Anion gap: 7 (ref 5–15)
BUN: 9 mg/dL (ref 8–23)
CO2: 28 mmol/L (ref 22–32)
Calcium: 9.4 mg/dL (ref 8.9–10.3)
Chloride: 105 mmol/L (ref 98–111)
Creatinine, Ser: 0.82 mg/dL (ref 0.44–1.00)
GFR, Estimated: >60 mL/min (ref >60)
Glucose, Bld: 137 mg/dL — ABNORMAL HIGH (ref 70–99)
Potassium: 3.4 mmol/L — ABNORMAL LOW (ref 3.5–5.1)
Sodium: 140 mmol/L (ref 135–145)
Total Bilirubin: 0.9 mg/dL (ref 0.3–1.2)
Total Protein: 7.1 g/dL (ref 6.5–8.1)

## 2023-01-15 LAB — LIPID PANEL
Cholesterol: 259 mg/dL — ABNORMAL HIGH (ref 0–200)
HDL: 30 mg/dL — ABNORMAL LOW (ref >40)
LDL Cholesterol: 192 mg/dL — ABNORMAL HIGH (ref 0–99)
Total CHOL/HDL Ratio: 8.6 RATIO
Triglycerides: 186 mg/dL — ABNORMAL HIGH (ref <150)
VLDL: 37 mg/dL (ref 0–40)

## 2023-01-15 LAB — CBC WITH DIFFERENTIAL/PLATELET
Abs Immature Granulocytes: 0.03 10*3/uL (ref 0.00–0.07)
Basophils Absolute: 0.1 10*3/uL (ref 0.0–0.1)
Basophils Relative: 1 %
Eosinophils Absolute: 0.2 10*3/uL (ref 0.0–0.5)
Eosinophils Relative: 4 %
HCT: 39.2 % (ref 36.0–46.0)
Hemoglobin: 13 g/dL (ref 12.0–15.0)
Immature Granulocytes: 1 %
Lymphocytes Relative: 29 %
Lymphs Abs: 1.7 10*3/uL (ref 0.7–4.0)
MCH: 27 pg (ref 26.0–34.0)
MCHC: 33.2 g/dL (ref 30.0–36.0)
MCV: 81.5 fL (ref 80.0–100.0)
Monocytes Absolute: 0.3 10*3/uL (ref 0.1–1.0)
Monocytes Relative: 6 %
Neutro Abs: 3.4 10*3/uL (ref 1.7–7.7)
Neutrophils Relative %: 59 %
Platelets: 287 10*3/uL (ref 150–400)
RBC: 4.81 MIL/uL (ref 3.87–5.11)
RDW: 14.6 % (ref 11.5–15.5)
WBC: 5.7 10*3/uL (ref 4.0–10.5)
nRBC: 0 % (ref 0.0–0.2)

## 2023-01-15 LAB — TSH: TSH: 1.444 u[IU]/mL (ref 0.350–4.500)

## 2023-01-15 LAB — PROTIME-INR
INR: 1.1 (ref 0.8–1.2)
Prothrombin Time: 14.1 seconds (ref 11.4–15.2)

## 2023-01-15 LAB — APTT: aPTT: 29 seconds (ref 24–36)

## 2023-01-15 LAB — TROPONIN I (HIGH SENSITIVITY)
Troponin I (High Sensitivity): 9 ng/L (ref <18)
Troponin I (High Sensitivity): 70 ng/L — ABNORMAL HIGH (ref <18)
Troponin I (High Sensitivity): 287 ng/L (ref <18)
Troponin I (High Sensitivity): 468 ng/L (ref <18)

## 2023-01-15 LAB — BRAIN NATRIURETIC PEPTIDE: B Natriuretic Peptide: 30.1 pg/mL (ref 0.0–100.0)

## 2023-01-15 MED ORDER — HEPARIN (PORCINE) 25000 UT/250ML-% IV SOLN
1000.0000 [IU]/h | INTRAVENOUS | Status: DC
  Administered 2023-01-15: 900 [IU]/h via INTRAVENOUS
  Administered 2023-01-16: 1000 [IU]/h via INTRAVENOUS
  Filled 2023-01-15 (×2): qty 250

## 2023-01-15 MED ORDER — AMLODIPINE BESYLATE 5 MG PO TABS
10.0000 mg | ORAL_TABLET | Freq: Once | ORAL | Status: AC
  Administered 2023-01-15: 10 mg via ORAL
  Filled 2023-01-15: qty 2

## 2023-01-15 MED ORDER — ONDANSETRON HCL 4 MG PO TABS: 4.0000 mg | ORAL_TABLET | Freq: Four times a day (QID) | ORAL | Status: DC | PRN

## 2023-01-15 MED ORDER — NITROGLYCERIN IN D5W 200-5 MCG/ML-% IV SOLN
0.0000 ug/min | INTRAVENOUS | Status: DC
  Administered 2023-01-15 (×2): 5 ug/min via INTRAVENOUS
  Filled 2023-01-15: qty 250

## 2023-01-15 MED ORDER — ACETAMINOPHEN 325 MG PO TABS
650.0000 mg | ORAL_TABLET | Freq: Four times a day (QID) | ORAL | Status: DC | PRN
  Administered 2023-01-15 – 2023-01-16 (×2): 650 mg via ORAL
  Filled 2023-01-15 (×2): qty 2

## 2023-01-15 MED ORDER — ASPIRIN 81 MG PO TBEC
81.0000 mg | DELAYED_RELEASE_TABLET | Freq: Every day | ORAL | Status: DC
  Administered 2023-01-16: 81 mg via ORAL
  Filled 2023-01-15 (×2): qty 1

## 2023-01-15 MED ORDER — IPRATROPIUM-ALBUTEROL 0.5-2.5 (3) MG/3ML IN SOLN: 3.0000 mL | Freq: Four times a day (QID) | RESPIRATORY_TRACT | Status: DC | PRN

## 2023-01-15 MED ORDER — HYDROCHLOROTHIAZIDE 25 MG PO TABS
25.0000 mg | ORAL_TABLET | Freq: Once | ORAL | Status: AC
  Administered 2023-01-15: 25 mg via ORAL
  Filled 2023-01-15: qty 1

## 2023-01-15 MED ORDER — ACETAMINOPHEN 650 MG RE SUPP: 650.0000 mg | Freq: Four times a day (QID) | RECTAL | Status: DC | PRN

## 2023-01-15 MED ORDER — HEPARIN BOLUS VIA INFUSION
4000.0000 [IU] | Freq: Once | INTRAVENOUS | Status: AC
  Administered 2023-01-15: 4000 [IU] via INTRAVENOUS
  Filled 2023-01-15: qty 4000

## 2023-01-15 MED ORDER — POTASSIUM CHLORIDE 20 MEQ PO PACK
40.0000 meq | PACK | Freq: Once | ORAL | Status: AC
  Administered 2023-01-15: 40 meq via ORAL
  Filled 2023-01-15: qty 2

## 2023-01-15 MED ORDER — ONDANSETRON HCL 4 MG/2ML IJ SOLN: 4.0000 mg | Freq: Four times a day (QID) | INTRAMUSCULAR | Status: DC | PRN

## 2023-01-15 MED ORDER — ATORVASTATIN CALCIUM 80 MG PO TABS
80.0000 mg | ORAL_TABLET | Freq: Every day | ORAL | Status: DC
  Administered 2023-01-15 – 2023-01-17 (×3): 80 mg via ORAL
  Filled 2023-01-15 (×2): qty 4
  Filled 2023-01-15: qty 1

## 2023-01-15 MED ORDER — SODIUM CHLORIDE 0.9% FLUSH
3.0000 mL | Freq: Two times a day (BID) | INTRAVENOUS | Status: DC
  Administered 2023-01-16 (×2): 3 mL via INTRAVENOUS

## 2023-01-15 MED ORDER — LABETALOL HCL 5 MG/ML IV SOLN
20.0000 mg | Freq: Once | INTRAVENOUS | Status: AC
  Administered 2023-01-15: 20 mg via INTRAVENOUS
  Filled 2023-01-15: qty 4

## 2023-01-15 MED ORDER — MORPHINE SULFATE (PF) 2 MG/ML IV SOLN: 0.5000 mg | INTRAVENOUS | Status: DC | PRN

## 2023-01-15 NOTE — Assessment & Plan Note (Signed)
Given hypertensive emergency, this is concerning for CVA versus PRES syndrome.   - MRI brain pending - Blood pressure control as noted above

## 2023-01-15 NOTE — ED Notes (Signed)
Pt to MRI

## 2023-01-15 NOTE — H&P (Signed)
History and Physical    Patient: Kim Munoz ZOX:[id number] DOB: Oct 01, 1955 DOA: 01/15/2023 DOS: the patient was seen and examined on 01/15/2023 PCP: Marisue Ivan, MD  Patient coming from: Home  Chief Complaint:  Chief Complaint  Patient presents with   Chest Pain   HPI: Kim Munoz is a 67 y.o. female with medical history significant of hypertension, type 2 diabetes, obesity, hyperlipidemia, tobacco use, moderate asthma/COPD, who presents to the ED due to chest pain.  Ms. 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  Pain radiated into her jaw area and bilateral face.  In addition, she experienced sudden onset bilateral blurry vision and what she describes as seeing a kaleidoscope colors in her bilateral periphery.  She denies any shortness of breath, diaphoresis or palpitations.  She denies any focal weakness or numbness.  She missed her home blood pressure meds today, but did take them yesterday and the day prior.  ED course: On arrival to the ED, patient was hypertensive at 187/91 with a heart rate of 72.  She was saturating at 100% on room air.  Initial workup notable for troponin of 9 with increased to 70.  WBC 5.7, hemoglobin 13.0, potassium 3.4, glucose 137, creatinine 0.82 with GFR above 60.  Urinalysis with decrease specific gravity but no other abnormalities.  Patient was given amlodipine, HCTZ and IV labetalol without any improvements in blood pressure.  Subsequently nitroglycerin infusion was initiated.  Cardiology consulted.  [facility] contacted for admission.  Review of Systems: As mentioned in the history of present illness. All other systems reviewed and are negative.  Past Medical History:  Diagnosis Date   Complication of anesthesia    nausea after colonoscopy   Family history of adverse reaction to anesthesia    sister - nausea  aafter surgery   Hypertension    Wears dentures    full upper and lower   Past Surgical History:  Procedure Laterality Date   ABDOMINAL HYSTERECTOMY     COLONOSCOPY     COLONOSCOPY WITH PROPOFOL N/A 01/13/2021   Procedure: COLONOSCOPY WITH PROPOFOL;  Surgeon: Toney Reil, MD;  Location: [facility] ENDOSCOPY;  Service: Gastroenterology;  Laterality: N/A;   GIVENS CAPSULE STUDY N/A 01/27/2021   Procedure: GIVENS CAPSULE STUDY;  Surgeon: Toney Reil, MD;  Location: [facility] ENDOSCOPY;  Service: Gastroenterology;  Laterality: N/A;   HALLUX VALGUS LAPIDUS Right 04/14/2016   Procedure: HALLUX VALGUS LAPIDUS CORRECTION RIGHT FOOT;  Surgeon: Gwyneth Revels, DPM;  Location: [facility];  Service: Podiatry;  Laterality: Right;  WITH POPLITEAL BLOCK   HALLUX VALGUS LAPIDUS Right 08/20/2016   Procedure: HALLUX VALGUS LAPIDUS;  Surgeon: Gwyneth Revels, DPM;  Location: [facility] ORS;  Service: Podiatry;  Laterality: Right;   Social History:  reports that she has been smoking cigarettes. She has a 17.50 pack-year smoking history. She has never used smokeless tobacco. She reports current alcohol use. She reports that she does not use drugs.  No Known Allergies  Family History  Problem Relation Age of Onset   Kidney disease Mother    Thyroid cancer Sister     Prior to Admission medications   Medication Sig Start Date End Date Taking? Authorizing Provider  albuterol (VENTOLIN HFA) 108 (90 Base) MCG/ACT inhaler Inhale 1 puff into the lungs every 4 (four) hours as needed for wheezing or shortness of breath. Inhale 2-4  puffs by mouth every 4 hours as needed for wheezing, cough, and/or shortness of breath 06/04/22   Burnadette Pop, MD  amLODipine (NORVASC) 10 MG tablet Take 10 mg by mouth every morning.     [provider]  hydrochlorothiazide (HYDRODIURIL) 25 MG tablet Take 1 tablet by mouth daily. 01/21/21   [provider]  ipratropium-albuterol (DUONEB) 0.5-2.5 (3) MG/3ML SOLN Take  3 mLs by nebulization every 6 (six) hours as needed. 06/04/22   Burnadette Pop, MD  ondansetron (ZOFRAN-ODT) 4 MG disintegrating tablet Take 1 tablet (4 mg total) by mouth every 8 (eight) hours as needed. 01/04/23   Georga Hacking, MD    Physical Exam: Vitals:   01/15/23 1430 01/15/23 1445 01/15/23 1500 01/15/23 1507  BP: (!) 183/95 (!) 177/96 (!) 175/89   Pulse: 65 71 68   Resp: 20 (!) 24 17   Temp:   98 F (36.7 C)   TempSrc:   Oral   SpO2: 96% 99% 95% 95%  Weight:      Height:       Physical Exam Vitals and nursing note reviewed.  Constitutional:      Appearance: She is obese.  HENT:     Head: Normocephalic and atraumatic.     Mouth/Throat:     Mouth: Mucous membranes are moist.     Pharynx: Oropharynx is clear.  Eyes:     Extraocular Movements:     Right eye: Abnormal extraocular motion present.     Left eye: Abnormal extraocular motion present.     Pupils: Pupils are equal, round, and reactive to light.     Comments: Difficulty following my finger bilaterally horizontally but no clear nystagmus  Cardiovascular:     Rate and Rhythm: Normal rate and regular rhythm.     Heart sounds: Heart sounds are distant. No murmur heard. Pulmonary:     Effort: Pulmonary effort is normal. No tachypnea.     Breath sounds: No decreased breath sounds, wheezing, rhonchi or rales.  Abdominal:     General: Bowel sounds are normal.     Palpations: Abdomen is soft.     Tenderness: There is no abdominal tenderness.  Musculoskeletal:     Right lower leg: No edema.     Left lower leg: No edema.  Skin:    General: Skin is warm and dry.  Neurological:     Mental Status: She is alert.     Comments:  Patient is alert and oriented x 3 No facial asymmetry or dysarthria Visual fields grossly intact, however patient is reporting seeing floating bright lights, with blurry vision and vision changes in her peripherals. 5 out of 5 strength throughout Finger-to-nose within normal  limits Sensation intact throughout  Psychiatric:        Mood and Affect: Mood normal.        Behavior: Behavior normal.    Data Reviewed: CBC with WBC of 5.7, hemoglobin 13.0, platelets of 287 CMP with sodium of 140, potassium 3.4, bicarb 28, glucose 137, creatinine 0.82, alkaline phosphatase 28, albumin 3.4, AST 16, ALT 14 and GFR above 60 Initial troponin 9 with increased to 70 Urinalysis with decreased Pacific gravity but no other abnormalities  EKG personally reviewed.  Sinus rhythm with rate of 64.  No ST or T wave changes consistent with acute ischemia at this point.  Results are pending, will review when available.  Assessment and Plan:  * Hypertensive emergency Patient is presenting with sudden onset chest pain with radiation  up to her jaw, and bilateral vision changes in the setting of significant hypertension consistent with hypertensive emergency.  Minimal improvement with IV labetalol, so the patient has been initiated on IV nitroglycerin.   - Continue IV nitroglycerin with a goal of SBP of 160 - Resume home antihypertensives once ischemic stroke and myocardial infarction are ruled out - Continue to trend troponin - MRI brain pending  NSTEMI (non-ST elevated myocardial infarction) (HCC) Troponin increased from 9 to 70 with ongoing active chest pain despite nitroglycerin infusion.  EKG with no ischemic changes.  Patient has multiple risk factors for a cardiovascular event including hypertension, hyperlipidemia, tobacco use, prediabetes and obesity.  Of note, last lipid panel in 2022 with LDL of 263 and total cholesterol of 357.  Patient is not on any statin therapy.   - Cardiology consulted; appreciate their recommendations - Continue to trend troponin to peak - Once MRI brain is completed to rule out acute CVA, will start heparin per pharmacy dosing - S/p aspirin 324 mg via EMS - Start aspirin 81 mg tomorrow - Continue nitroglycerin infusion - Oxygen as needed to  maintain oxygen saturation above 92% - Echocardiogram  Blurry vision Given hypertensive emergency, this is concerning for CVA versus PRES syndrome.   - MRI brain pending - Blood pressure control as noted above  Dyslipidemia Severe hyperlipidemia noted on lipid panel approximately 1-1/2 years ago with total cholesterol of 357 and LDL of 263.  Patient will need high intensity statin   - Start atorvastatin 80 mg daily - Repeat lipid panel  Pre-diabetes - A1c pending  Asthma, chronic - Continue home bronchodilators - DuoNebs as needed   Advance Care Planning:   Code Status: Full Code verified by patient  Consults: Cardiology  Family Communication: No family at bedside  Severity of Illness: The appropriate patient status for this patient is INPATIENT. Inpatient status is judged to be reasonable and necessary in order to provide the required intensity of service to ensure the patient's safety. The patient's presenting symptoms, physical exam findings, and initial radiographic and laboratory data in the context of their chronic comorbidities is felt to place them at high risk for further clinical deterioration. Furthermore, it is not anticipated that the patient will be medically stable for discharge from the hospital within 2 midnights of admission.   * I certify that at the point of admission it is my clinical judgment that the patient will require inpatient hospital care spanning beyond 2 midnights from the point of admission due to high intensity of service, high risk for further deterioration and high frequency of surveillance required.*  Author: Verdene Lennert, MD 01/15/2023 3:51 PM  For on call review [email or web address].

## 2023-01-15 NOTE — Assessment & Plan Note (Signed)
Severe hyperlipidemia noted on lipid panel approximately 1-1/2 years ago with total cholesterol of 357 and LDL of 263.  Patient will need high intensity statin   - Start atorvastatin 80 mg daily - Repeat lipid panel

## 2023-01-15 NOTE — Assessment & Plan Note (Signed)
-   Continue home bronchodilators - DuoNebs as needed

## 2023-01-15 NOTE — ED Notes (Signed)
Pt up to toilet with steady gait 

## 2023-01-15 NOTE — ED Provider Notes (Signed)
[facility] Provider Note   Event Date/Time   First MD Initiated Contact with Patient 01/15/23 1118     (approximate) History  Chest Pain  HPI Kim Munoz is a 67 y.o. female who presents [place] via EMS with complaints of extremely high blood pressure, chest pain, and visual changes.  Patient states that these visual changes looks like a "kaleidoscope at the sides of my vision" and resolved prior to transport.  Patient states that she is still having 8/10 central heavy chest pain that does not radiate and is worse with exertion.  Patient states that she did not take her normal blood pressure medications today as it makes her go to the bathroom more often than usual and she did not want to be wasting time at her job. ROS: Patient currently denies any vision changes, tinnitus, difficulty speaking, facial droop, sore throat, chest pain, shortness of breath, abdominal pain, nausea/vomiting/diarrhea, dysuria, or weakness/numbness/paresthesias in any extremity   Physical Exam  Triage Vital Signs: ED Triage Vitals  Enc Vitals Group     BP      Pulse      Resp      Temp      Temp src      SpO2      Weight      Height      Head Circumference      Peak Flow      Pain Score      Pain Loc      Pain Edu?      Excl. in GC?    Most recent vital signs: Vitals:   01/15/23 1500 01/15/23 1507  BP: (!) 175/89   Pulse: 68   Resp: 17   Temp: 98 F (36.7 C)   SpO2: 95% 95%   General: Awake, oriented x4. CV:  Good peripheral perfusion.  Resp:  Normal effort.  Abd:  No distention.  Other:  Elderly obese African-American female laying in bed in no acute distress.  Intraocular pressures 20 on the left and 18 on the right ED Results / Procedures / Treatments  Labs (all labs ordered are listed, but only abnormal results are displayed) Labs Reviewed  COMPREHENSIVE METABOLIC PANEL - Abnormal; Notable for the following components:      Result Value    Potassium 3.4 (*)    Glucose, Bld 137 (*)    Albumin 3.4 (*)    Alkaline Phosphatase 28 (*)    All other components within normal limits  URINALYSIS, ROUTINE W REFLEX MICROSCOPIC - Abnormal; Notable for the following components:   Color, Urine COLORLESS (*)    APPearance CLEAR (*)    Specific Gravity, Urine 1.002 (*)    All other components within normal limits  TROPONIN I (HIGH SENSITIVITY) - Abnormal; Notable for the following components:   Troponin I (High Sensitivity) 70 (*)    All other components within normal limits  CBC WITH DIFFERENTIAL/PLATELET  BRAIN NATRIURETIC PEPTIDE  TSH  HEMOGLOBIN A1C  LIPID PANEL  TROPONIN I (HIGH SENSITIVITY)  TROPONIN I (HIGH SENSITIVITY)   EKG ED ECG REPORT I, Merwyn Katos, the attending physician, personally viewed and interpreted this ECG. Date: 01/15/2023 EKG Time: 1114 Rate: 64 Rhythm: normal sinus rhythm QRS Axis: normal Intervals: normal ST/T Wave abnormalities: normal Narrative Interpretation: no evidence of acute ischemia RADIOLOGY ED MD interpretation: CT of the head without contrast interpreted by me shows no evidence of acute abnormalities including no intracerebral hemorrhage,  obvious masses, or significant edema -Agree with radiology assessment Official radiology report(s): CT Head Wo Contrast  Result Date: 01/15/2023 CLINICAL DATA:  Stroke suspected.  Neural deficit. EXAM: CT HEAD WITHOUT CONTRAST TECHNIQUE: Contiguous axial images were obtained from the base of the skull through the vertex without intravenous contrast. RADIATION DOSE REDUCTION: This exam was performed according to the departmental dose-optimization program which includes automated exposure control, adjustment of the mA and/or kV according to patient size and/or use of iterative reconstruction technique. COMPARISON:  None. FINDINGS: Brain: No evidence of acute infarction, hemorrhage, hydrocephalus, extra-axial collection or mass lesion/mass effect. Vascular:  No hyperdense vessel or unexpected calcification. Skull: Normal. Negative for fracture or focal lesion. Sinuses/Orbits: No acute finding. Other: None. IMPRESSION: No acute intracranial pathology. Electronically Signed   By: Ted Mcalpine M.D.   On: 01/15/2023 15:36   PROCEDURES: Critical Care performed: Yes, see critical care procedure note(s) .1-3 Lead EKG Interpretation  Performed by: Merwyn Katos, MD Authorized by: Merwyn Katos, MD     Interpretation: normal     ECG rate:  71   ECG rate assessment: normal     Rhythm: sinus rhythm     Ectopy: none     Conduction: normal   CRITICAL CARE Performed by: Merwyn Katos  Total critical care time: 35 minutes  Critical care time was exclusive of separately billable procedures and treating other patients.  Critical care was necessary to treat or prevent imminent or life-threatening deterioration.  Critical care was time spent personally by me on the following activities: development of treatment plan with patient and/or surrogate as well as nursing, discussions with consultants, evaluation of patient's response to treatment, examination of patient, obtaining history from patient or surrogate, ordering and performing treatments and interventions, ordering and review of laboratory studies, ordering and review of radiographic studies, pulse oximetry and re-evaluation of patient's condition.  MEDICATIONS ORDERED IN ED: Medications  nitroGLYCERIN 50 mg in dextrose 5 % 250 mL (0.2 mg/mL) infusion (10 mcg/min Intravenous Rate/Dose Change 01/15/23 1330)  sodium chloride flush (NS) 0.9 % injection 3 mL (has no administration in time range)  acetaminophen (TYLENOL) tablet 650 mg (has no administration in time range)    Or  acetaminophen (TYLENOL) suppository 650 mg (has no administration in time range)  ondansetron (ZOFRAN) tablet 4 mg (has no administration in time range)    Or  ondansetron (ZOFRAN) injection 4 mg (has no administration  in time range)  aspirin EC tablet 81 mg (has no administration in time range)  atorvastatin (LIPITOR) tablet 80 mg (has no administration in time range)  potassium chloride (KLOR-CON) packet 40 mEq (has no administration in time range)  ipratropium-albuterol (DUONEB) 0.5-2.5 (3) MG/3ML nebulizer solution 3 mL (has no administration in time range)  labetalol (NORMODYNE) injection 20 mg (20 mg Intravenous Given 01/15/23 1129)  amLODipine (NORVASC) tablet 10 mg (10 mg Oral Given 01/15/23 1240)  hydrochlorothiazide (HYDRODIURIL) tablet 25 mg (25 mg Oral Given 01/15/23 1240)   IMPRESSION / MDM / ASSESSMENT AND PLAN / ED COURSE  I reviewed the triage vital signs and the nursing notes.                             The patient is on the cardiac monitor to evaluate for evidence of arrhythmia and/or significant heart rate changes. Patient's presentation is most consistent with acute presentation with potential threat to life or bodily function.  This patient presents  to the ED for concern of chest pain and hypertension, this involves an extensive number of treatment options, and is a complaint that carries with it a high risk of complications and morbidity.  The differential diagnosis includes hypertensive emergency, ACS, CVA Co morbidities that complicate the patient evaluation  Hypertension Additional history obtained:  External records from outside source obtained and reviewed including emergency department note from 01/04/2023 Lab Tests:  I Ordered, and personally interpreted labs.  The pertinent results include: Troponin 70 Imaging Studies ordered:  I ordered imaging studies including head CT  I independently visualized and interpreted imaging which showed negative for any acute abnormalities  I agree with the radiologist interpretation Cardiac Monitoring: / EKG:  The patient was maintained on a cardiac monitor.  I personally viewed and interpreted the cardiac monitored which showed an  underlying rhythm of: NSR Consultations Obtained:  I requested consultation with the hospitalist,  and discussed lab and imaging findings as well as pertinent plan - they recommend: Admission Problem List / ED Course / Critical interventions / Medication management  Hypertensive emergency, chest pain  I ordered medication including nitroglycerin for hypertension  Reevaluation of the patient after these medicines showed that the patient improved  I have reviewed the patients home medicines and have made adjustments as needed Dispo: Admit to medicine       FINAL CLINICAL IMPRESSION(S) / ED DIAGNOSES   Final diagnoses:  Hypertensive emergency  Chest pain, unspecified type   Rx / DC Orders   ED Discharge Orders     None      Note:  This document was prepared using Dragon voice recognition software and may include unintentional dictation errors.   Merwyn Katos, MD 01/15/23 [phone]

## 2023-01-15 NOTE — ED Notes (Signed)
Advised nurse that patient has ready bed 

## 2023-01-15 NOTE — Assessment & Plan Note (Signed)
A1c pending.

## 2023-01-15 NOTE — Progress Notes (Signed)
ANTICOAGULATION CONSULT NOTE  Pharmacy Consult for heparin infusion Indication: ACS/STEMI  No Known Allergies  Patient Measurements: Height: 5\' 1"  (154.9 cm) Weight: 88.9 kg (196 lb) IBW/kg (Calculated) : 47.8 Heparin Dosing Weight: 68.5 kg  Vital Signs: Temp: 98 F (36.7 C) (04/27 1500) Temp Source: Oral (04/27 1500) BP: 162/94 (04/27 1800) Pulse Rate: 71 (04/27 1800)  Labs: Recent Labs    01/15/23 1119 01/15/23 1317 01/15/23 1639  HGB 13.0  --   --   HCT 39.2  --   --   PLT 287  --   --   CREATININE 0.82  --   --   TROPONINIHS 9 70* 287*    Estimated Creatinine Clearance: 70.2 mL/min (by C-G formula based on SCr of 0.82 mg/dL).   Medical History: Past Medical History:  Diagnosis Date   Complication of anesthesia    nausea after colonoscopy   Family history of adverse reaction to anesthesia    sister - nausea aafter surgery   Hypertension    Wears dentures    full upper and lower    Assessment: Pt is a 67 yo female presenting to ED d/t chest pain, found with elevated troponin I lvl, trending up.  Goal of Therapy:  Heparin level 0.3-0.7 units/ml Monitor platelets by anticoagulation protocol: Yes   Plan:  Bolus 4000 units x 1 Start heparin infusion at 900 units/hr Will check HL in 6 hr after start of infusion CBC daily while on heparin  Otelia Sergeant, PharmD, [facility] 01/15/2023 6:24 PM

## 2023-01-15 NOTE — Assessment & Plan Note (Signed)
Patient is presenting with sudden onset chest pain with radiation up to her jaw, and bilateral vision changes in the setting of significant hypertension consistent with hypertensive emergency.  Minimal improvement with IV labetalol, so the patient has been initiated on IV nitroglycerin.   - Continue IV nitroglycerin with a goal of SBP of 160 - Resume home antihypertensives once ischemic stroke and myocardial infarction are ruled out - Continue to trend troponin - MRI brain pending

## 2023-01-15 NOTE — ED Notes (Signed)
Trop reported @ 32 Bradler informed

## 2023-01-15 NOTE — Assessment & Plan Note (Signed)
Troponin increased from 9 to 70 with ongoing active chest pain despite nitroglycerin infusion.  EKG with no ischemic changes.  Patient has multiple risk factors for a cardiovascular event including hypertension, hyperlipidemia, tobacco use, prediabetes and obesity.  Of note, last lipid panel in 2022 with LDL of 263 and total cholesterol of 357.  Patient is not on any statin therapy.   - Cardiology consulted; appreciate their recommendations - Continue to trend troponin to peak - Once MRI brain is completed to rule out acute CVA, will start heparin per pharmacy dosing - S/p aspirin 324 mg via EMS - Start aspirin 81 mg tomorrow - Continue nitroglycerin infusion - Oxygen as needed to maintain oxygen saturation above 92% - Echocardiogram

## 2023-01-15 NOTE — ED Triage Notes (Signed)
Pt ems from work for chest pain that started approx. 1000. Pain 7/10 now, sharp, and radiating to right jaw. Ems gave 1 nitro spray that pt said improved her pain and 324 mg aspirin.

## 2023-01-16 DIAGNOSIS — R079 Chest pain, unspecified: Secondary | ICD-10-CM

## 2023-01-16 DIAGNOSIS — I10 Essential (primary) hypertension: Secondary | ICD-10-CM

## 2023-01-16 DIAGNOSIS — Z720 Tobacco use: Secondary | ICD-10-CM

## 2023-01-16 LAB — MAGNESIUM: Magnesium: 2.1 mg/dL (ref 1.7–2.4)

## 2023-01-16 LAB — COMPREHENSIVE METABOLIC PANEL
ALT: 13 U/L (ref 0–44)
AST: 14 U/L — ABNORMAL LOW (ref 15–41)
Albumin: 3.5 g/dL (ref 3.5–5.0)
Alkaline Phosphatase: 28 U/L — ABNORMAL LOW (ref 38–126)
Anion gap: 8 (ref 5–15)
BUN: 8 mg/dL (ref 8–23)
CO2: 27 mmol/L (ref 22–32)
Calcium: 9.4 mg/dL (ref 8.9–10.3)
Chloride: 103 mmol/L (ref 98–111)
Creatinine, Ser: 0.71 mg/dL (ref 0.44–1.00)
GFR, Estimated: >60 mL/min (ref >60)
Glucose, Bld: 125 mg/dL — ABNORMAL HIGH (ref 70–99)
Potassium: 3.2 mmol/L — ABNORMAL LOW (ref 3.5–5.1)
Sodium: 138 mmol/L (ref 135–145)
Total Bilirubin: 1.2 mg/dL (ref 0.3–1.2)
Total Protein: 6.9 g/dL (ref 6.5–8.1)

## 2023-01-16 LAB — HEMOGLOBIN A1C
Hgb A1c MFr Bld: 6 % — ABNORMAL HIGH (ref 4.8–5.6)
Mean Plasma Glucose: 125.5 mg/dL

## 2023-01-16 LAB — CBC
HCT: 36.7 % (ref 36.0–46.0)
Hemoglobin: 12.5 g/dL (ref 12.0–15.0)
MCH: 27.5 pg (ref 26.0–34.0)
MCHC: 34.1 g/dL (ref 30.0–36.0)
MCV: 80.8 fL (ref 80.0–100.0)
Platelets: 267 10*3/uL (ref 150–400)
RBC: 4.54 MIL/uL (ref 3.87–5.11)
RDW: 14.8 % (ref 11.5–15.5)
WBC: 5.6 10*3/uL (ref 4.0–10.5)
nRBC: 0 % (ref 0.0–0.2)

## 2023-01-16 LAB — HEPARIN LEVEL (UNFRACTIONATED)
Heparin Unfractionated: 0.27 IU/mL — ABNORMAL LOW (ref 0.30–0.70)
Heparin Unfractionated: 0.45 IU/mL (ref 0.30–0.70)
Heparin Unfractionated: 0.48 IU/mL (ref 0.30–0.70)

## 2023-01-16 LAB — TROPONIN I (HIGH SENSITIVITY)
Troponin I (High Sensitivity): 343 ng/L (ref <18)
Troponin I (High Sensitivity): 478 ng/L (ref <18)
Troponin I (High Sensitivity): 603 ng/L (ref <18)

## 2023-01-16 MED ORDER — HYDROCHLOROTHIAZIDE 50 MG PO TABS
50.0000 mg | ORAL_TABLET | Freq: Every day | ORAL | Status: DC
  Filled 2023-01-16: qty 1

## 2023-01-16 MED ORDER — NITROGLYCERIN 0.4 MG SL SUBL: 0.4000 mg | SUBLINGUAL_TABLET | SUBLINGUAL | Status: DC | PRN

## 2023-01-16 MED ORDER — POTASSIUM CHLORIDE 20 MEQ PO PACK
40.0000 meq | PACK | Freq: Once | ORAL | Status: AC
  Administered 2023-01-16: 40 meq via ORAL
  Filled 2023-01-16: qty 2

## 2023-01-16 MED ORDER — HYDROCHLOROTHIAZIDE 25 MG PO TABS
50.0000 mg | ORAL_TABLET | Freq: Every day | ORAL | Status: DC
  Administered 2023-01-16: 50 mg via ORAL
  Filled 2023-01-16: qty 2

## 2023-01-16 MED ORDER — POLYETHYLENE GLYCOL 3350 17 G PO PACK: 17.0000 g | PACK | Freq: Two times a day (BID) | ORAL | Status: DC | PRN

## 2023-01-16 MED ORDER — SODIUM CHLORIDE 0.9% FLUSH
3.0000 mL | Freq: Two times a day (BID) | INTRAVENOUS | Status: DC
  Administered 2023-01-16: 3 mL via INTRAVENOUS

## 2023-01-16 MED ORDER — ACETAMINOPHEN 500 MG PO TABS: 1000.0000 mg | ORAL_TABLET | Freq: Three times a day (TID) | ORAL | Status: DC | PRN

## 2023-01-16 MED ORDER — METOPROLOL TARTRATE 25 MG PO TABS
12.5000 mg | ORAL_TABLET | Freq: Two times a day (BID) | ORAL | Status: DC
  Administered 2023-01-16 (×2): 12.5 mg via ORAL
  Filled 2023-01-16 (×2): qty 1

## 2023-01-16 MED ORDER — HYDRALAZINE HCL 20 MG/ML IJ SOLN: 10.0000 mg | Freq: Four times a day (QID) | INTRAMUSCULAR | Status: DC | PRN

## 2023-01-16 MED ORDER — AMLODIPINE BESYLATE 10 MG PO TABS
10.0000 mg | ORAL_TABLET | Freq: Every day | ORAL | Status: DC
  Administered 2023-01-16 – 2023-01-17 (×2): 10 mg via ORAL
  Filled 2023-01-16: qty 1
  Filled 2023-01-16: qty 2

## 2023-01-16 MED ORDER — HEPARIN BOLUS VIA INFUSION
1000.0000 [IU] | Freq: Once | INTRAVENOUS | Status: AC
  Administered 2023-01-16: 1000 [IU] via INTRAVENOUS
  Filled 2023-01-16: qty 1000

## 2023-01-16 MED ORDER — DOCUSATE SODIUM 100 MG PO CAPS: 100.0000 mg | ORAL_CAPSULE | Freq: Two times a day (BID) | ORAL | Status: DC | PRN

## 2023-01-16 NOTE — ED Notes (Signed)
Pt reports CP that was present 5 minutes ago has now resolved.

## 2023-01-16 NOTE — ED Notes (Signed)
Pt eating breakfast meal tray. Family visiting at bedside.

## 2023-01-16 NOTE — ED Notes (Signed)
Pt ambulatory to bathroom independently with steady gait.

## 2023-01-16 NOTE — ED Notes (Signed)
Pt requesting to see MD. When asking patient why she needs to see MD she states she has been in the ED for hours and still has not got a room upstairs. Informed patient the doctors do not have any pull on whether she gets a bed upstairs or not. Told patient I would talk to charge nurse and see if I could at least get her a hospital bed so she could be more comfortable to go to sleep and rest in.

## 2023-01-16 NOTE — H&P (View-Only) (Signed)
 Cardiology Consultation   Patient ID: Kim Munoz MRN: [id number]; DOB: 11/16/1955  Admit date: 01/15/2023 Date of Consult: 01/16/2023  PCP:  Linthavong, Kanhka, MD   [facility] Providers Cardiologist: New  Patient Profile:   Kim Munoz is a 66 y.o. female with a hx of hypertension, type 2 diabetes, obesity, hyperlipidemia, tobacco use, moderate asthma/COPD who is being seen 01/16/2023 for the evaluation of chest pain at the request of Dr. Lai.  History of Present Illness:   Kim Munoz not been seen by cardiology in the past. Family history of CAD in dad. She smokes 1 pack eery 3 days. No alcohol or drug use.  Patient presented to the ER on 01/15/2023 with chest pain.  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  She described as a deep pressure in her substernal region.  Pain radiated to her jaw and face.  Also noted blurry vision.  She denies shortness of breath, diaphoresis, or palpitations.  She missed her home blood pressure medications that day. She reports she has had chest pain on and off for a couple months; it normally self-resolves  In the ER patient was hypertensive 187/91 with a heart rate of 72.  Oxygen was normal.  High-sensitivity troponin 9, with increased to 70> 27> 468> 603.  WBC 5.7, hemoglobin 13, potassium 3.4, serum creatinine 0.82.  Patient was given amlodipine, hydrochlorothiazide, and IV labetalol without improvement.  Nitro drip was started.  CT head and brain MRI were nonacute.  EKG showed sinus rhythm with no significant ischemic changes.  Patient was admitted for further workup.  Past Medical History:  Diagnosis Date   Complication of anesthesia    nausea after colonoscopy   Family history of adverse reaction to anesthesia    sister - nausea aafter surgery   Hypertension    Wears dentures    full upper and lower    Past Surgical History:  Procedure Laterality Date    ABDOMINAL HYSTERECTOMY     COLONOSCOPY     COLONOSCOPY WITH PROPOFOL N/A 01/13/2021   Procedure: COLONOSCOPY WITH PROPOFOL;  Surgeon: Vanga, Rohini Reddy, MD;  Location: [facility];  Service: Gastroenterology;  Laterality: N/A;   GIVENS CAPSULE STUDY N/A 01/27/2021   Procedure: GIVENS CAPSULE STUDY;  Surgeon: Vanga, Rohini Reddy, MD;  Location: [facility] ENDOSCOPY;  Service: Gastroenterology;  Laterality: N/A;   HALLUX VALGUS LAPIDUS Right 04/14/2016   Procedure: HALLUX VALGUS LAPIDUS CORRECTION RIGHT FOOT;  Surgeon: Justin Fowler, DPM;  Location: [facility];  Service: Podiatry;  Laterality: Right;  WITH POPLITEAL BLOCK   HALLUX VALGUS LAPIDUS Right 08/20/2016   Procedure: HALLUX VALGUS LAPIDUS;  Surgeon: Justin Fowler, DPM;  Location: [facility] OR[facility];  Service: Podiatry;  Laterality: Right;     Home Medications:  Prior to Admission medications   Medication Sig Start Date End Date Taking? Authorizing Provider  amLODipine (NORVASC) 10 MG tablet Take 10 mg by mouth every morning.    Yes [provider]  hydrochlorothiazide (HYDRODIURIL) 25 MG tablet Take 25 mg by mouth daily.   Yes [provider]  ipratropium-albuterol (DUONEB) 0.5-2.5 (3) MG/3ML SOLN Take 3 mLs by nebulization every 6 (six) hours as needed. 06/04/22  Yes Adhikari, Amrit, MD  ondansetron (ZOFRAN-ODT) 4 MG disintegrating tablet Take 1 tablet (4 mg total) by mouth every 8 (eight) hours as needed. 01/04/23  Yes McHugh, Kelly Rose, MD    Inpatient Medications: Scheduled Meds:  aspirin EC    81 mg Oral Daily   atorvastatin  80 mg Oral Daily   sodium chloride flush  3 mL Intravenous Q12H   Continuous Infusions:  heparin 1,000 Units/hr (01/16/23 0051)   nitroGLYCERIN 10 mcg/min (01/15/23 2242)   PRN Meds: acetaminophen **OR** acetaminophen, ipratropium-albuterol, morphine injection, ondansetron **OR** ondansetron (ZOFRAN) IV  Allergies:   No Known Allergies  Social History:   Social History   Socioeconomic  History   Marital status: Single    Spouse name: Not on file   Number of children: Not on file   Years of education: Not on file   Highest education level: Not on file  Occupational History   Not on file  Tobacco Use   Smoking status: Every Day    Packs/day: 0.50    Years: 35.00    Additional pack years: 0.00    Total pack years: 17.50    Types: Cigarettes   Smokeless tobacco: Never  Vaping Use   Vaping Use: Never used  Substance and Sexual Activity   Alcohol use: Yes    Comment: 2 drinks/mo   Drug use: Never   Sexual activity: Not on file  Other Topics Concern   Not on file  Social History Narrative   Not on file   Social Determinants of Health   Financial Resource Strain: Not on file  Food Insecurity: Not on file  Transportation Needs: No Transportation Needs (06/02/2022)   PRAPARE - Transportation    Lack of Transportation (Medical): No    Lack of Transportation (Non-Medical): No  Physical Activity: Not on file  Stress: Not on file  Social Connections: Not on file  Intimate Partner Violence: Not At Risk (06/02/2022)   Humiliation, Afraid, Rape, and Kick questionnaire    Fear of Current or Ex-Partner: No    Emotionally Abused: No    Physically Abused: No    Sexually Abused: No    Family History:    Family History  Problem Relation Age of Onset   Kidney disease Mother    Thyroid cancer Sister      ROS:  Please see the history of present illness.   All other ROS reviewed and negative.     Physical Exam/Data:   Vitals:   01/16/23 0530 01/16/23 0600 01/16/23 0630 01/16/23 0700  BP: (!) 142/86 (!) 133/94 139/88 (!) 146/81  Pulse: 73 78 73 69  Resp: 18 (!) 28 (!) 24 (!) 21  Temp:      TempSrc:      SpO2: 95% 100% 96% 92%  Weight:      Height:       No intake or output data in the 24 hours ending 01/16/23 0733    01/15/2023   11:24 AM 01/04/2023    8:33 AM 06/02/2022    5:52 AM  Last 3 Weights  Weight (lbs) 196 lb 193 lb 198 lb 3.1 oz  Weight (kg)  88.905 kg 87.544 kg 89.9 kg     Body mass index is 37.03 kg/m.  General:  Well nourished, well developed, in no acute distress HEENT: normal Neck: no JVD Vascular: No carotid bruits; Distal pulses 2+ bilaterally Cardiac:  normal S1, S2; RRR; no murmur  Lungs:  clear to auscultation bilaterally, no wheezing, rhonchi or rales  Abd: soft, nontender, no hepatomegaly  Ext: no edema Musculoskeletal:  No deformities, BUE and BLE strength normal and equal Skin: warm and dry  Neuro:  CNs 2-12 intact, no focal abnormalities noted Psych:  Normal affect     EKG:  The EKG was personally reviewed and demonstrates: Normal sinus rhythm, 64 bpm, T wave flattening aVL, V2, V3. Telemetry:  Telemetry was personally reviewed and demonstrates:  NSR HR 60-70s  Relevant CV Studies:  Echo ordered  Laboratory Data:  High Sensitivity Troponin:   Recent Labs  Lab 01/15/23 1317 01/15/23 1639 01/15/23 1830 01/16/23 0014 01/16/23 0323  TROPONINIHS 70* 287* 468* 603* 478*     Chemistry Recent Labs  Lab 01/15/23 1119 01/16/23 0323  NA 140 138  K 3.4* 3.2*  CL 105 103  CO2 28 27  GLUCOSE 137* 125*  BUN 9 8  CREATININE 0.82 0.71  CALCIUM 9.4 9.4  GFRNONAA >60 >60  ANIONGAP 7 8    Recent Labs  Lab 01/15/23 1119 01/16/23 0323  PROT 7.1 6.9  ALBUMIN 3.4* 3.5  AST 16 14*  ALT 14 13  ALKPHOS 28* 28*  BILITOT 0.9 1.2   Lipids  Recent Labs  Lab 01/15/23 1639  CHOL 259*  TRIG 186*  HDL 30*  LDLCALC 192*  CHOLHDL 8.6    Hematology Recent Labs  Lab 01/15/23 1119 01/16/23 0323  WBC 5.7 5.6  RBC 4.81 4.54  HGB 13.0 12.5  HCT 39.2 36.7  MCV 81.5 80.8  MCH 27.0 27.5  MCHC 33.2 34.1  RDW 14.6 14.8  PLT 287 267   Thyroid  Recent Labs  Lab 01/15/23 1639  TSH 1.444    BNP Recent Labs  Lab 01/15/23 1119  BNP 30.1    DDimer No results for input(s): "DDIMER" in the last 168 hours.   Radiology/Studies:  MR BRAIN WO CONTRAST  Result Date: 01/15/2023 CLINICAL DATA:   Hypertension, chest pain, visual changes EXAM: MRI HEAD WITHOUT CONTRAST TECHNIQUE: Multiplanar, multiecho pulse sequences of the brain and surrounding structures were obtained without intravenous contrast. COMPARISON:  No prior MRI available, correlation is made with CT head 01/15/2023 FINDINGS: Brain: No restricted diffusion to suggest acute or subacute infarct. No acute hemorrhage, mass, mass effect, or midline shift. No hydrocephalus or extra-axial collection. Normal pituitary and craniocervical junction. Scattered T2 hyperintense signal in the periventricular white matter, likely the sequela of mild chronic small vessel ischemic disease. Vascular: Normal arterial flow voids. Skull and upper cervical spine: Normal marrow signal. Sinuses/Orbits: Clear paranasal sinuses. No acute finding in the orbits. Other: The mastoid air cells are well aerated. IMPRESSION: No acute intracranial process. No evidence of acute or subacute infarct. Electronically Signed   By: Alison  Vasan M.D.   On: 01/15/2023 18:12   CT Head Wo Contrast  Result Date: 01/15/2023 CLINICAL DATA:  Stroke suspected.  Neural deficit. EXAM: CT HEAD WITHOUT CONTRAST TECHNIQUE: Contiguous axial images were obtained from the base of the skull through the vertex without intravenous contrast. RADIATION DOSE REDUCTION: This exam was performed according to the departmental dose-optimization program which includes automated exposure control, adjustment of the mA and/or kV according to patient size and/or use of iterative reconstruction technique. COMPARISON:  None. FINDINGS: Brain: No evidence of acute infarction, hemorrhage, hydrocephalus, extra-axial collection or mass lesion/mass effect. Vascular: No hyperdense vessel or unexpected calcification. Skull: Normal. Negative for fracture or focal lesion. Sinuses/Orbits: No acute finding. Other: None. IMPRESSION: No acute intracranial pathology. Electronically Signed   By: Dobrinka  Dimitrova M.D.   On:  01/15/2023 15:36     Assessment and Plan:   NSTEMI -Chest pain found to have severe hypertension and elevated troponin -Troponin elevated to the 600s and EKG with no significant ischemic changes -Started on IV   heparin and IV nitro - IV NTG recently stopped -Started on aspirin 81 mg daily, Lipitor 80 mg daily, and Lopressor 12.5mg BID - she reports 6/10 chest pain -Check echocardiogram -Plan for cath on Monday Risks and benefits of cardiac catheterization have been discussed with the patient.  These include bleeding, infection, kidney damage, stroke, heart attack, death.  The patient understands these risks and is willing to proceed.   Hypertension -PTA amlodipine 10 mg daily, hydrochlorothiazide 25 mg daily>restarted -IV nitro drip started> wean nitro drip as able - continue Lopressor  Dyslipidemia -Started on Lipitor 80 mg daily -Total cholesterol 259, HDL 30, LDL 192, TG 186 - re-check lipid panel/LFTs in 6-8 weeks  Tobacco use - cessation recommended  For questions or updates, please contact [facility] Please consult [email or web address] for contact info under    Signed, Murielle Stang H Brand Siever, PA-C  01/16/2023 7:33 AM  

## 2023-01-16 NOTE — Consult Note (Signed)
Cardiology Consultation   Patient ID: Kim Munoz MRN: [id number]; DOB: 1956/07/14  Admit date: 01/15/2023 Date of Consult: 01/16/2023  PCP:  Marisue Ivan, MD   [facility] Providers Cardiologist: New  Patient Profile:   Kim Munoz is a 67 y.o. female with a hx of hypertension, type 2 diabetes, obesity, hyperlipidemia, tobacco use, moderate asthma/COPD who is being seen 01/16/2023 for the evaluation of chest pain at the request of Dr. Fran Lowes.  History of Present Illness:   Kim Munoz not been seen by cardiology in the past. Family history of CAD in dad. She smokes 1 pack eery 3 days. No alcohol or drug use.  Patient presented to the ER on 01/15/2023 with chest pain.  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  She described as a deep pressure in her substernal region.  Pain radiated to her jaw and face.  Also noted blurry vision.  She denies shortness of breath, diaphoresis, or palpitations.  She missed her home blood pressure medications that day. She reports she has had chest pain on and off for a couple months; it normally self-resolves  In the ER patient was hypertensive 187/91 with a heart rate of 72.  Oxygen was normal.  High-sensitivity troponin 9, with increased to 70> 27> 468> 603.  WBC 5.7, hemoglobin 13, potassium 3.4, serum creatinine 0.82.  Patient was given amlodipine, hydrochlorothiazide, and IV labetalol without improvement.  Nitro drip was started.  CT head and brain MRI were nonacute.  EKG showed sinus rhythm with no significant ischemic changes.  Patient was admitted for further workup.  Past Medical History:  Diagnosis Date   Complication of anesthesia    nausea after colonoscopy   Family history of adverse reaction to anesthesia    sister - nausea aafter surgery   Hypertension    Wears dentures    full upper and lower    Past Surgical History:  Procedure Laterality Date    ABDOMINAL HYSTERECTOMY     COLONOSCOPY     COLONOSCOPY WITH PROPOFOL N/A 01/13/2021   Procedure: COLONOSCOPY WITH PROPOFOL;  Surgeon: Toney Reil, MD;  Location: [facility] ENDOSCOPY;  Service: Gastroenterology;  Laterality: N/A;   GIVENS CAPSULE STUDY N/A 01/27/2021   Procedure: GIVENS CAPSULE STUDY;  Surgeon: Toney Reil, MD;  Location[facility] END[facility]COPY;  Service: Gastroenterology;  Laterality: N/A;   HALLUX VALGUS LAPIDUS Right 04/14/2016   Procedure: HALLUX VALGUS LAPIDUS CORRECTION RIGHT FOOT;  Surgeon: Gwyneth Revels, DPM;  Location: [facility];  Service: Podiatry;  Laterality: Right;  WITH POPLITEAL BLOCK   HALLUX VALGUS LAPIDUS Right 08/20/2016   Procedure: HALLUX VALGUS LAPIDUS;  Surgeon: Gwyneth Revels, DPM;  Location: [facility] ORS;  Service: Podiatry;  Laterality: Right;     Home Medications:  Prior to Admission medications   Medication Sig Start Date End Date Taking? Authorizing Provider  amLODipine (NORVASC) 10 MG tablet Take 10 mg by mouth every morning.    Yes [provider]  hydrochlorothiazide (HYDRODIURIL) 25 MG tablet Take 25 mg by mouth daily.   Yes [provider]  ipratropium-albuterol (DUONEB) 0.5-2.5 (3) MG/3ML SOLN Take 3 mLs by nebulization every 6 (six) hours as needed. 06/04/22  Yes Burnadette Pop, MD  ondansetron (ZOFRAN-ODT) 4 MG disintegrating tablet Take 1 tablet (4 mg total) by mouth every 8 (eight) hours as needed. 01/04/23  Yes Georga Hacking, MD    Inpatient Medications: Scheduled Meds:  aspirin EC  81 mg Oral Daily   atorvastatin  80 mg Oral Daily   sodium chloride flush  3 mL Intravenous Q12H   Continuous Infusions:  heparin 1,000 Units/hr (01/16/23 0051)   nitroGLYCERIN 10 mcg/min (01/15/23 2242)   PRN Meds: acetaminophen **OR** acetaminophen, ipratropium-albuterol, morphine injection, ondansetron **OR** ondansetron (ZOFRAN) IV  Allergies:   No Known Allergies  Social History:   Social History   Socioeconomic  History   Marital status: Single    Spouse name: Not on file   Number of children: Not on file   Years of education: Not on file   Highest education level: Not on file  Occupational History   Not on file  Tobacco Use   Smoking status: Every Day    Packs/day: 0.50    Years: 35.00    Additional pack years: 0.00    Total pack years: 17.50    Types: Cigarettes   Smokeless tobacco: Never  Vaping Use   Vaping Use: Never used  Substance and Sexual Activity   Alcohol use: Yes    Comment: 2 drinks/mo   Drug use: Never   Sexual activity: Not on file  Other Topics Concern   Not on file  Social History Narrative   Not on file   Social Determinants of Health   Financial Resource Strain: Not on file  Food Insecurity: Not on file  Transportation Needs: No Transportation Needs (06/02/2022)   PRAPARE - Transportation    Lack of Transportation (Medical): No    Lack of Transportation (Non-Medical): No  Physical Activity: Not on file  Stress: Not on file  Social Connections: Not on file  Intimate Partner Violence: Not At Risk (06/02/2022)   Humiliation, Afraid, Rape, and Kick questionnaire    Fear of Current or Ex-Partner: No    Emotionally Abused: No    Physically Abused: No    Sexually Abused: No    Family History:    Family History  Problem Relation Age of Onset   Kidney disease Mother    Thyroid cancer Sister      ROS:  Please see the history of present illness.   All other ROS reviewed and negative.     Physical Exam/Data:   Vitals:   01/16/23 0530 01/16/23 0600 01/16/23 0630 01/16/23 0700  BP: (!) 142/86 (!) 133/94 139/88 (!) 146/81  Pulse: 73 78 73 69  Resp: 18 (!) 28 (!) 24 (!) 21  Temp:      TempSrc:      SpO2: 95% 100% 96% 92%  Weight:      Height:       No intake or output data in the 24 hours ending 01/16/23 0733    01/15/2023   11:24 AM 01/04/2023    8:33 AM 06/02/2022    5:52 AM  Last 3 Weights  Weight (lbs) 196 lb 193 lb 198 lb 3.1 oz  Weight (kg)  88.905 kg 87.544 kg 89.9 kg     Body mass index is 37.03 kg/m.  General:  Well nourished, well developed, in no acute distress HEENT: normal Neck: no JVD Vascular: No carotid bruits; Distal pulses 2+ bilaterally Cardiac:  normal S1, S2; RRR; no murmur  Lungs:  clear to auscultation bilaterally, no wheezing, rhonchi or rales  Abd: soft, nontender, no hepatomegaly  Ext: no edema Musculoskeletal:  No deformities, BUE and BLE strength normal and equal Skin: warm and dry  Neuro:  CNs 2-12 intact, no focal abnormalities noted Psych:  Normal affect  EKG:  The EKG was personally reviewed and demonstrates: Normal sinus rhythm, 64 bpm, T wave flattening aVL, V2, V3. Telemetry:  Telemetry was personally reviewed and demonstrates:  NSR HR 60-70s  Relevant CV Studies:  Echo ordered  Laboratory Data:  High Sensitivity Troponin:   Recent Labs  Lab 01/15/23 1317 01/15/23 1639 01/15/23 1830 01/16/23 0014 01/16/23 0323  TROPONINIHS 70* 287* 468* 603* 478*     Chemistry Recent Labs  Lab 01/15/23 1119 01/16/23 0323  NA 140 138  K 3.4* 3.2*  CL 105 103  CO2 28 27  GLUCOSE 137* 125*  BUN 9 8  CREATININE 0.82 0.71  CALCIUM 9.4 9.4  GFRNONAA >60 >60  ANIONGAP 7 8    Recent Labs  Lab 01/15/23 1119 01/16/23 0323  PROT 7.1 6.9  ALBUMIN 3.4* 3.5  AST 16 14*  ALT 14 13  ALKPHOS 28* 28*  BILITOT 0.9 1.2   Lipids  Recent Labs  Lab 01/15/23 1639  CHOL 259*  TRIG 186*  HDL 30*  LDLCALC 192*  CHOLHDL 8.6    Hematology Recent Labs  Lab 01/15/23 1119 01/16/23 0323  WBC 5.7 5.6  RBC 4.81 4.54  HGB 13.0 12.5  HCT 39.2 36.7  MCV 81.5 80.8  MCH 27.0 27.5  MCHC 33.2 34.1  RDW 14.6 14.8  PLT 287 267   Thyroid  Recent Labs  Lab 01/15/23 1639  TSH 1.444    BNP Recent Labs  Lab 01/15/23 1119  BNP 30.1    DDimer No results for input(s): "DDIMER" in the last 168 hours.   Radiology/Studies:  MR BRAIN WO CONTRAST  Result Date: 01/15/2023 CLINICAL DATA:   Hypertension, chest pain, visual changes EXAM: MRI HEAD WITHOUT CONTRAST TECHNIQUE: Multiplanar, multiecho pulse sequences of the brain and surrounding structures were obtained without intravenous contrast. COMPARISON:  No prior MRI available, correlation is made with CT head 01/15/2023 FINDINGS: Brain: No restricted diffusion to suggest acute or subacute infarct. No acute hemorrhage, mass, mass effect, or midline shift. No hydrocephalus or extra-axial collection. Normal pituitary and craniocervical junction. Scattered T2 hyperintense signal in the periventricular white matter, likely the sequela of mild chronic small vessel ischemic disease. Vascular: Normal arterial flow voids. Skull and upper cervical spine: Normal marrow signal. Sinuses/Orbits: Clear paranasal sinuses. No acute finding in the orbits. Other: The mastoid air cells are well aerated. IMPRESSION: No acute intracranial process. No evidence of acute or subacute infarct. Electronically Signed   By: Wiliam Ke M.D.   On: 01/15/2023 18:12   CT Head Wo Contrast  Result Date: 01/15/2023 CLINICAL DATA:  Stroke suspected.  Neural deficit. EXAM: CT HEAD WITHOUT CONTRAST TECHNIQUE: Contiguous axial images were obtained from the base of the skull through the vertex without intravenous contrast. RADIATION DOSE REDUCTION: This exam was performed according to the departmental dose-optimization program which includes automated exposure control, adjustment of the mA and/or kV according to patient size and/or use of iterative reconstruction technique. COMPARISON:  None. FINDINGS: Brain: No evidence of acute infarction, hemorrhage, hydrocephalus, extra-axial collection or mass lesion/mass effect. Vascular: No hyperdense vessel or unexpected calcification. Skull: Normal. Negative for fracture or focal lesion. Sinuses/Orbits: No acute finding. Other: None. IMPRESSION: No acute intracranial pathology. Electronically Signed   By: Ted Mcalpine M.D.   On:  01/15/2023 15:36     Assessment and Plan:   NSTEMI -Chest pain found to have severe hypertension and elevated troponin -Troponin elevated to the 600s and EKG with no significant ischemic changes -Started on IV  heparin and IV nitro - IV NTG recently stopped -Started on aspirin 81 mg daily, Lipitor 80 mg daily, and Lopressor 12.5mg  BID - she reports 6/10 chest pain -Check echocardiogram -Plan for cath on Monday Risks and benefits of cardiac catheterization have been discussed with the patient.  These include bleeding, infection, kidney damage, stroke, heart attack, death.  The patient understands these risks and is willing to proceed.   Hypertension -PTA amlodipine 10 mg daily, hydrochlorothiazide 25 mg daily>restarted -IV nitro drip started> wean nitro drip as able - continue Lopressor  Dyslipidemia -Started on Lipitor 80 mg daily -Total cholesterol 259, HDL 30, LDL 192, TG 186 - re-check lipid panel/LFTs in 6-8 weeks  Tobacco use - cessation recommended  For questions or updates, please contact [facility] Please consult [email or web address] for contact info under    Signed, Ludella Pranger David Stall, PA-C  01/16/2023 7:33 AM

## 2023-01-16 NOTE — ED Notes (Addendum)
Pt c/o h/a gave pt tylenol with sip of water.  She was taken to the bathroom and ambulated without issue

## 2023-01-16 NOTE — Progress Notes (Signed)
ANTICOAGULATION CONSULT NOTE  Pharmacy Consult for Heparin Infusion Indication: ACS/STEMI  Patient Measurements: Height: 5\' 1"  (154.9 cm) Weight: 88.9 kg (196 lb) IBW/kg (Calculated) : 47.8 Heparin Dosing Weight: 68.5 kg  Labs: Recent Labs    01/15/23 1119 01/15/23 1317 01/15/23 1830 01/16/23 0014 01/16/23 0323 01/16/23 0618 01/16/23 0711 01/16/23 1154  HGB 13.0  --   --   --  12.5  --   --   --   HCT 39.2  --   --   --  36.7  --   --   --   PLT 287  --   --   --  267  --   --   --   APTT  --   --  29  --   --   --   --   --   LABPROT  --   --  14.1  --   --   --   --   --   INR  --   --  1.1  --   --   --   --   --   HEPARINUNFRC  --   --   --  0.27*  --  0.45  --  0.48  CREATININE 0.82  --   --   --  0.71  --   --   --   TROPONINIHS 9   < > 468* 603* 478*  --  343*  --    < > = values in this interval not displayed.    Estimated Creatinine Clearance: 72 mL/min (by C-G formula based on SCr of 0.71 mg/dL).  Medical History: Past Medical History:  Diagnosis Date   Complication of anesthesia    nausea after colonoscopy   Family history of adverse reaction to anesthesia    sister - nausea aafter surgery   Hypertension    Wears dentures    full upper and lower    Assessment: Pt is a 67 yo female presenting to ED d/t chest pain, found with elevated troponin I lvl, trending up.  0428 0618 HL 0.45, therapeutic x 1; 1000 units/hr 0428 1154 HL 0.48, therapeutic x 2; 1000 units/hr  Goal of Therapy:  Heparin level 0.3-0.7 units/ml Monitor platelets by anticoagulation protocol: Yes   Plan:  --Heparin level is therapeutic x 2 --Continue heparin infusion at 1000 units/hr --Re-check HL and CBC tomorrow AM  Tressie Ellis 01/16/2023 12:17 PM

## 2023-01-16 NOTE — ED Notes (Signed)
Patient up to toilet. Moved patient into a hospital bed. Pt also put on socks and new sheets, linens and pillows.

## 2023-01-16 NOTE — Progress Notes (Signed)
   01/16/23 2014  Vitals  Temp 98.7 F (37.1 C)  Temp Source Oral  BP (!) 137/97  MAP (mmHg) 110  BP Location Right Arm  BP Method Automatic  Patient Position (if appropriate) Lying  Pulse Rate 75  Pulse Rate Source Monitor  Resp (!) 22  MEWS COLOR  MEWS Score Color Green  Oxygen Therapy  SpO2 97 %  O2 Device Room Air  O2 Flow Rate (L/min) 0 L/min  Pain Assessment  Pain Scale 0-10  Pain Score 0  Pain Type Acute pain  Pain Location Chest  MEWS Score  MEWS Temp 0  MEWS Systolic 0  MEWS Pulse 0  MEWS RR 1  MEWS LOC 0  MEWS Score 1   Katy Foust NP notified due to the patient having 10/10 chest pain. An EKG was obtained and vitals were also obtained. During the EKG the patient stated that her chest pain was subsiding and was a 4/10. After EKG was completed the patient stated that her chest pain was a 0/10. Cardiology contacted per Bishop Limbo NP's request. Dr. Nadara Mode cardiologist was on call and returned my page. I let Dr. Nadara Mode know the situation with the patient and was granted a verbal order for nitro 0.4mg  SL for the patient. Patient currently in the bed talking on the phone at this time. I will continue to monitor the patient.

## 2023-01-16 NOTE — Progress Notes (Signed)
  PROGRESS NOTE    Kim Munoz  ZOX:[id number] DOB: 1956-06-24 DOA: 01/15/2023 PCP: Marisue Ivan, MD  [id number]  LOS: 1 day   Brief hospital course:   Assessment & Plan: Kim Munoz is a 67 y.o. female with medical history significant of hypertension, type 2 diabetes, obesity, hyperlipidemia, tobacco use, moderate asthma/COPD, who presents to the ED due to chest pain.    * Hypertensive emergency Patient is presenting with sudden onset chest pain with radiation up to her jaw, and bilateral vision changes in the setting of significant hypertension consistent with hypertensive emergency.  Minimal improvement with IV labetalol, so the patient has been initiated on IV nitroglycerin. --off nitro gtt today Plan: --cont home amlodipine --increase home HCTZ to 50 mg daily --add Lopressor  Non-ST elevation (NSTEMI) myocardial infarction (HCC) --trop 9 on presentation, then peaked at 603.  Started on heparin gtt. --cardio consulted Plan: --Echo --heart cath tomorrow  Blurry vision --possibly due to elevated BP.  MRI brain neg.  Dyslipidemia Severe hyperlipidemia noted on lipid panel approximately 1-1/2 years ago with total cholesterol of 357 and LDL of 263.  Current LDL 192. --cont Lipitor (new)  Pre-diabetes - A1c 6.0  Asthma, chronic --stable  Hypokalemia --monitor and replete PRN  Smoker --cessation recommended   DVT prophylaxis: [id number] gtt Code Status: Full code  Family Communication: husband updated at bedside today Level of care: Progressive Dispo:   The patient is from: home Anticipated d/c is to: home Anticipated d/c date is: possible tomorrow   Subjective and Interval History:  No more chest pain.  Got off nitro gtt.   Objective: Vitals:   01/16/23 1245 01/16/23 1300 01/16/23 1315 01/16/23 1330  BP:  (!) 145/82  126/87  Pulse:  61  71  Resp:  19  (!) 22  Temp:      TempSrc:      SpO2: 92%  95%   Weight:      Height:       No  intake or output data in the 24 hours ending 01/16/23 1409 Filed Weights   01/15/23 1124  Weight: 88.9 kg    Examination:   Constitutional: NAD, AAOx3 HEENT: conjunctivae and lids normal, EOMI CV: No cyanosis.   RESP: normal respiratory effort, on RA Neuro: II - XII grossly intact.   Psych: Normal mood and affect.  Appropriate judgement and reason   Data Reviewed: I have personally reviewed labs and imaging studies  Time spent: 50 minutes  Darlin Priestly, MD [facility] If 7PM-7AM, please contact night-coverage 01/16/2023, 2:09 PM

## 2023-01-16 NOTE — Progress Notes (Signed)
ANTICOAGULATION CONSULT NOTE  Pharmacy Consult for heparin infusion Indication: ACS/STEMI  No Known Allergies  Patient Measurements: Height: 5\' 1"  (154.9 cm) Weight: 88.9 kg (196 lb) IBW/kg (Calculated) : 47.8 Heparin Dosing Weight: 68.5 kg  Vital Signs: Temp: 98.5 F (36.9 C) (04/28 0200) Temp Source: Oral (04/28 0200) BP: 146/81 (04/28 0700) Pulse Rate: 69 (04/28 0700)  Labs: Recent Labs    01/15/23 1119 01/15/23 1317 01/15/23 1830 01/16/23 0014 01/16/23 0323 01/16/23 0618  HGB 13.0  --   --   --  12.5  --   HCT 39.2  --   --   --  36.7  --   PLT 287  --   --   --  267  --   APTT  --   --  29  --   --   --   LABPROT  --   --  14.1  --   --   --   INR  --   --  1.1  --   --   --   HEPARINUNFRC  --   --   --  0.27*  --  0.45  CREATININE 0.82  --   --   --  0.71  --   TROPONINIHS 9   < > 468* 603* 478*  --    < > = values in this interval not displayed.     Estimated Creatinine Clearance: 72 mL/min (by C-G formula based on SCr of 0.71 mg/dL).   Medical History: Past Medical History:  Diagnosis Date   Complication of anesthesia    nausea after colonoscopy   Family history of adverse reaction to anesthesia    sister - nausea aafter surgery   Hypertension    Wears dentures    full upper and lower    Assessment: Pt is a 67 yo female presenting to ED d/t chest pain, found with elevated troponin I lvl, trending up.  Goal of Therapy:  Heparin level 0.3-0.7 units/ml Monitor platelets by anticoagulation protocol: Yes   Plan:  4/28:  HL @ 0618 = 0.45, therapeutic X 1 - Will continue pt on current rate and recheck HL in 6 hrs on 4/28 @ 1200.  CBC daily while on heparin  Bryam Taborda D 01/16/2023 7:20 AM

## 2023-01-16 NOTE — Progress Notes (Signed)
ANTICOAGULATION CONSULT NOTE  Pharmacy Consult for heparin infusion Indication: ACS/STEMI  No Known Allergies  Patient Measurements: Height: 5\' 1"  (154.9 cm) Weight: 88.9 kg (196 lb) IBW/kg (Calculated) : 47.8 Heparin Dosing Weight: 68.5 kg  Vital Signs: Temp: 98 F (36.7 C) (04/27 2200) Temp Source: Oral (04/27 2200) BP: 147/90 (04/28 0015) Pulse Rate: 58 (04/28 0015)  Labs: Recent Labs    01/15/23 1119 01/15/23 1317 01/15/23 1639 01/15/23 1830 01/16/23 0014  HGB 13.0  --   --   --   --   HCT 39.2  --   --   --   --   PLT 287  --   --   --   --   APTT  --   --   --  29  --   LABPROT  --   --   --  14.1  --   INR  --   --   --  1.1  --   HEPARINUNFRC  --   --   --   --  0.27*  CREATININE 0.82  --   --   --   --   TROPONINIHS 9   < > 287* 468* 603*   < > = values in this interval not displayed.     Estimated Creatinine Clearance: 70.2 mL/min (by C-G formula based on SCr of 0.82 mg/dL).   Medical History: Past Medical History:  Diagnosis Date   Complication of anesthesia    nausea after colonoscopy   Family history of adverse reaction to anesthesia    sister - nausea aafter surgery   Hypertension    Wears dentures    full upper and lower    Assessment: Pt is a 67 yo female presenting to ED d/t chest pain, found with elevated troponin I lvl, trending up.  Goal of Therapy:  Heparin level 0.3-0.7 units/ml Monitor platelets by anticoagulation protocol: Yes   Plan:  4/28:  HL @ 0014 = 0.27, SUBtherapeutic  - Will order heparin 1000 units IV X 1 bolus and increase drip rate to 1000 units/hr.  - Will recheck HL 6 hrs after rate change.  CBC daily while on heparin  Magic Mohler D 01/16/2023 12:49 AM

## 2023-01-17 ENCOUNTER — Encounter
Admission: EM | Disposition: A | Discharge status: Home / Self Care | Payer: Self-pay | Source: Home / Self Care | Attending: Hospitalist

## 2023-01-17 ENCOUNTER — Inpatient Hospital Stay (HOSPITAL_COMMUNITY)
Admit: 2023-01-17 | Discharge: 2023-01-17 | Disposition: A | Discharge status: Home / Self Care | Payer: Self-pay | Attending: Cardiovascular Disease | Admitting: Cardiovascular Disease

## 2023-01-17 DIAGNOSIS — R7989 Other specified abnormal findings of blood chemistry: Secondary | ICD-10-CM

## 2023-01-17 DIAGNOSIS — I214 Non-ST elevation (NSTEMI) myocardial infarction: Secondary | ICD-10-CM

## 2023-01-17 HISTORY — PX: LEFT HEART CATH AND CORONARY ANGIOGRAPHY: CATH118249

## 2023-01-17 LAB — ECHOCARDIOGRAM COMPLETE
AR max vel: 2.19 cm2
AV Area VTI: 1.67 cm2
AV Area mean vel: 1.69 cm2
AV Mean grad: 4 mmHg
AV Peak grad: 8 mmHg
Ao pk vel: 1.41 m/s
Area-P 1/2: 2.49 cm2
Height: 61 in
MV VTI: 1.7 cm2
S' Lateral: 3 cm
Weight: 3206.4 oz

## 2023-01-17 LAB — CBC
HCT: 40.4 % (ref 36.0–46.0)
Hemoglobin: 13.7 g/dL (ref 12.0–15.0)
MCH: 27.1 pg (ref 26.0–34.0)
MCHC: 33.9 g/dL (ref 30.0–36.0)
MCV: 80 fL (ref 80.0–100.0)
Platelets: 318 10*3/uL (ref 150–400)
RBC: 5.05 MIL/uL (ref 3.87–5.11)
RDW: 14.9 % (ref 11.5–15.5)
WBC: 6.3 10*3/uL (ref 4.0–10.5)
nRBC: 0 % (ref 0.0–0.2)

## 2023-01-17 LAB — BASIC METABOLIC PANEL
Anion gap: 7 (ref 5–15)
BUN: 15 mg/dL (ref 8–23)
CO2: 28 mmol/L (ref 22–32)
Calcium: 9.7 mg/dL (ref 8.9–10.3)
Chloride: 103 mmol/L (ref 98–111)
Creatinine, Ser: 0.88 mg/dL (ref 0.44–1.00)
GFR, Estimated: >60 mL/min (ref >60)
Glucose, Bld: 97 mg/dL (ref 70–99)
Potassium: 4.1 mmol/L (ref 3.5–5.1)
Sodium: 138 mmol/L (ref 135–145)

## 2023-01-17 LAB — HEPARIN LEVEL (UNFRACTIONATED): Heparin Unfractionated: 0.64 IU/mL (ref 0.30–0.70)

## 2023-01-17 LAB — MAGNESIUM: Magnesium: 2.1 mg/dL (ref 1.7–2.4)

## 2023-01-17 SURGERY — LEFT HEART CATH AND CORONARY ANGIOGRAPHY
Anesthesia: Moderate Sedation

## 2023-01-17 MED ORDER — SODIUM CHLORIDE 0.9 % IV SOLN: INTRAVENOUS | Status: AC

## 2023-01-17 MED ORDER — SODIUM CHLORIDE 0.9% FLUSH: 3.0000 mL | INTRAVENOUS | Status: DC | PRN

## 2023-01-17 MED ORDER — SODIUM CHLORIDE 0.9 % WEIGHT BASED INFUSION: 1.0000 mL/kg/h | INTRAVENOUS | Status: DC

## 2023-01-17 MED ORDER — MIDAZOLAM HCL 2 MG/2ML IJ SOLN
INTRAMUSCULAR | Status: DC | PRN
  Administered 2023-01-17 (×2): 1 mg via INTRAVENOUS

## 2023-01-17 MED ORDER — CARVEDILOL 6.25 MG PO TABS: 6.2500 mg | ORAL_TABLET | Freq: Two times a day (BID) | ORAL | Status: DC

## 2023-01-17 MED ORDER — ASPIRIN 81 MG PO CHEW: 81.0000 mg | CHEWABLE_TABLET | ORAL | Status: DC

## 2023-01-17 MED ORDER — HEPARIN SODIUM (PORCINE) 1000 UNIT/ML IJ SOLN
INTRAMUSCULAR | Status: DC | PRN
  Administered 2023-01-17: 4500 [IU] via INTRAVENOUS

## 2023-01-17 MED ORDER — SODIUM CHLORIDE 0.9 % WEIGHT BASED INFUSION: 3.0000 mL/kg/h | INTRAVENOUS | Status: DC

## 2023-01-17 MED ORDER — SODIUM CHLORIDE 0.9 % WEIGHT BASED INFUSION
3.0000 mL/kg/h | INTRAVENOUS | Status: DC
  Administered 2023-01-17: 3 mL/kg/h via INTRAVENOUS

## 2023-01-17 MED ORDER — LIDOCAINE HCL (PF) 1 % IJ SOLN
INTRAMUSCULAR | Status: DC | PRN
  Administered 2023-01-17: 2 mL

## 2023-01-17 MED ORDER — HEPARIN (PORCINE) IN NACL 2000-0.9 UNIT/L-% IV SOLN
INTRAVENOUS | Status: DC | PRN
  Administered 2023-01-17: 1000 mL

## 2023-01-17 MED ORDER — SODIUM CHLORIDE 0.9 % IV SOLN: 250.0000 mL | INTRAVENOUS | Status: DC | PRN

## 2023-01-17 MED ORDER — FENTANYL CITRATE (PF) 100 MCG/2ML IJ SOLN
INTRAMUSCULAR | Status: AC
  Filled 2023-01-17: qty 2

## 2023-01-17 MED ORDER — MIDAZOLAM HCL 2 MG/2ML IJ SOLN
INTRAMUSCULAR | Status: AC
  Filled 2023-01-17: qty 2

## 2023-01-17 MED ORDER — ATORVASTATIN CALCIUM 80 MG PO TABS: 80.0000 mg | ORAL_TABLET | Freq: Every day | ORAL | 0 refills | Status: DC

## 2023-01-17 MED ORDER — ASPIRIN 81 MG PO CHEW
81.0000 mg | CHEWABLE_TABLET | ORAL | Status: AC
  Administered 2023-01-17: 81 mg via ORAL
  Filled 2023-01-17: qty 1

## 2023-01-17 MED ORDER — HEPARIN (PORCINE) IN NACL 1000-0.9 UT/500ML-% IV SOLN
INTRAVENOUS | Status: AC
  Filled 2023-01-17: qty 1000

## 2023-01-17 MED ORDER — FENTANYL CITRATE (PF) 100 MCG/2ML IJ SOLN
INTRAMUSCULAR | Status: DC | PRN
  Administered 2023-01-17 (×2): 25 ug via INTRAVENOUS

## 2023-01-17 MED ORDER — SODIUM CHLORIDE 0.9% FLUSH: 3.0000 mL | Freq: Two times a day (BID) | INTRAVENOUS | Status: DC

## 2023-01-17 MED ORDER — VERAPAMIL HCL 2.5 MG/ML IV SOLN
INTRAVENOUS | Status: DC | PRN
  Administered 2023-01-17 (×2): 2.5 mg via INTRA_ARTERIAL

## 2023-01-17 MED ORDER — HEPARIN SODIUM (PORCINE) 1000 UNIT/ML IJ SOLN
INTRAMUSCULAR | Status: AC
  Filled 2023-01-17: qty 10

## 2023-01-17 MED ORDER — LISINOPRIL 10 MG PO TABS: 10.0000 mg | ORAL_TABLET | Freq: Every day | ORAL | 2 refills | Status: DC

## 2023-01-17 MED ORDER — VERAPAMIL HCL 2.5 MG/ML IV SOLN
INTRAVENOUS | Status: AC
  Filled 2023-01-17: qty 2

## 2023-01-17 MED ORDER — IOHEXOL 300 MG/ML  SOLN
INTRAMUSCULAR | Status: DC | PRN
  Administered 2023-01-17: 90 mL

## 2023-01-17 MED ORDER — CARVEDILOL 6.25 MG PO TABS: 6.2500 mg | ORAL_TABLET | Freq: Two times a day (BID) | ORAL | 2 refills | Status: DC

## 2023-01-17 MED ORDER — HYDROCHLOROTHIAZIDE 25 MG PO TABS
25.0000 mg | ORAL_TABLET | Freq: Every day | ORAL | Status: DC
  Administered 2023-01-17: 25 mg via ORAL
  Filled 2023-01-17: qty 1

## 2023-01-17 SURGICAL SUPPLY — 15 items
CATH 5FR JL3.5 JR4 ANG PIG MP (CATHETERS) IMPLANT
CATH INFINITI 5FR AL1 (CATHETERS) IMPLANT
CATH INFINITI 5FR JK (CATHETERS) IMPLANT
DEVICE RAD TR BAND REGULAR (VASCULAR PRODUCTS) IMPLANT
DRAPE BRACHIAL (DRAPES) IMPLANT
DRAPE FEMORAL ANGIO W/ POUCH (DRAPES) IMPLANT
GLIDESHEATH SLEND SS 6F .021 (SHEATH) IMPLANT
GUIDEWIRE INQWIRE 1.5J.035X260 (WIRE) IMPLANT
INQWIRE 1.5J .035X260CM (WIRE) ×2
KIT SYRINGE INJ CVI SPIKEX1 (MISCELLANEOUS) IMPLANT
PACK CARDIAC CATH (CUSTOM PROCEDURE TRAY) ×1 IMPLANT
PROTECTION STATION PRESSURIZED (MISCELLANEOUS) ×1
SET ATX-X65L (MISCELLANEOUS) IMPLANT
STATION PROTECTION PRESSURIZED (MISCELLANEOUS) IMPLANT
WIRE HITORQ VERSACORE ST 145CM (WIRE) IMPLANT

## 2023-01-17 NOTE — Interval H&P Note (Signed)
History and Physical Interval Note:  01/17/2023 8:26 AM  Kim Munoz  has presented today for surgery, with the diagnosis of chest pain.  The various methods of treatment have been discussed with the patient and family. After consideration of risks, benefits and other options for treatment, the patient has consented to  Procedure(s): LEFT HEART CATH AND CORONARY ANGIOGRAPHY (N/A) as a surgical intervention.  The patient's history has been reviewed, patient examined, no change in status, stable for surgery.  I have reviewed the patient's chart and labs.  Questions were answered to the patient's satisfaction.     Lorine Bears

## 2023-01-17 NOTE — Progress Notes (Signed)
ANTICOAGULATION CONSULT NOTE  Pharmacy Consult for Heparin Infusion Indication: ACS/STEMI  Patient Measurements: Height: 5\' 1"  (154.9 cm) Weight: 88.9 kg (196 lb) IBW/kg (Calculated) : 47.8 Heparin Dosing Weight: 68.5 kg  Labs: Recent Labs    01/15/23 1119 01/15/23 1317 01/15/23 1830 01/15/23 1830 01/16/23 0014 01/16/23 0323 01/16/23 0618 01/16/23 0711 01/16/23 1154 01/17/23 0450  HGB 13.0  --   --   --   --  12.5  --   --   --  13.7  HCT 39.2  --   --   --   --  36.7  --   --   --  40.4  PLT 287  --   --   --   --  267  --   --   --  318  APTT  --   --  29  --   --   --   --   --   --   --   LABPROT  --   --  14.1  --   --   --   --   --   --   --   INR  --   --  1.1  --   --   --   --   --   --   --   HEPARINUNFRC  --   --   --    < > 0.27*  --  0.45  --  0.48 0.64  CREATININE 0.82  --   --   --   --  0.71  --   --   --  0.88  TROPONINIHS 9   < > 468*  --  603* 478*  --  343*  --   --    < > = values in this interval not displayed.    Estimated Creatinine Clearance: 65.5 mL/min (by C-G formula based on SCr of 0.88 mg/dL).  Medical History: Past Medical History:  Diagnosis Date   Complication of anesthesia    nausea after colonoscopy   Family history of adverse reaction to anesthesia    sister - nausea aafter surgery   Hypertension    Wears dentures    full upper and lower    Assessment: Pt is a 68 yo female presenting to ED d/t chest pain, found with elevated troponin I lvl, trending up.  0428 0618 HL 0.45, therapeutic x 1; 1000 units/hr 0428 1154 HL 0.48, therapeutic x 2; 1000 units/hr 0429 0450 HL 0.64, therapeutic X 3; 1000 units/hr  Goal of Therapy:  Heparin level 0.3-0.7 units/ml Monitor platelets by anticoagulation protocol: Yes   Plan:  04/29:  HL @ 0450 = 0.64, therapeutic X 3 - Will continue pt on current rate and recheck HL on 4/30 with AM labs.  --Re-check HL and CBC tomorrow AM  Etherine Mackowiak D 01/17/2023 6:21 AM

## 2023-01-17 NOTE — Progress Notes (Signed)
*  PRELIMINARY RESULTS* Echocardiogram 2D Echocardiogram has been performed.  Carolyne Fiscal 01/17/2023, 4:17 PM

## 2023-01-17 NOTE — Discharge Summary (Signed)
Physician Discharge Summary   Kim Munoz  female DOB: 1956-09-12  ZOX:[id number]  PCP: Marisue Ivan, MD  Admit date: 01/15/2023 Discharge date: 01/17/2023  Admitted From: home Disposition:  home CODE STATUS: Full code  Discharge Instructions     Diet - low sodium heart healthy   Complete by: As directed    Discharge instructions   Complete by: As directed    For your blood pressure control, since you don't want to take hydrochlorothiazide, I have replaced it with Lisinopril 10 mg daily.  Cardiologist also added Coreg.  Continue your amlodipine.  Please follow up with your PCP for further adjustment.  You have been started on Lipitor for high cholesterol.     Dr. Darlin Priestly [facility] Course:  For full details, please see H&P, progress notes, consult notes and ancillary notes.  Briefly,  Kim Munoz is a 67 y.o. female with medical history significant of hypertension, type 2 diabetes, obesity, hyperlipidemia, tobacco use, moderate asthma/COPD, who presented to the ED due to chest pain.    * Hypertensive emergency Patient is presenting with sudden onset chest pain with radiation up to her jaw, and bilateral vision changes in the setting of significant hypertension consistent with hypertensive emergency, resulting in cardiac ischemia with trop elevation to 600's.   --Minimal improvement with IV labetalol, so the patient received Nitro gtt with improvement. --coreg started by cardio.  Cont home amlodipine. --Pt doesn't want to take HCTZ due to it's diuretic effect, so Lisinopril 10 mg daily was ordered instead.   Trop elevation 2/2 demand ischemia Non-ST elevation, ruled out --trop 9 on presentation, then peaked at 603.  Started on heparin gtt. --Cardiac catheterization was done which showed minimal nonobstructive coronary artery disease.  Echo unremarkable.  Blurry vision --possibly due to elevated BP.  MRI brain neg.   Dyslipidemia Severe  hyperlipidemia noted on lipid panel approximately 1-1/2 years ago with total cholesterol of 357 and LDL of 263.  Current LDL 192. --started on Lipitor   Pre-diabetes - A1c 6.0   Asthma, chronic --stable   Hypokalemia --monitored and repleted PRN   Smoker --cessation recommended   Discharge Diagnoses:  Principal Problem:   Hypertensive emergency Active Problems:   Non-ST elevation (NSTEMI) myocardial infarction (HCC)   Blurry vision   Dyslipidemia   Pre-diabetes   Asthma, chronic   Chest pain   Elevated troponin   30 Day Unplanned Readmission Risk Score    Flowsheet Row ED to Hosp-Admission (Current) from 01/15/2023 in [facility]U  30 Day Unplanned Readmission Risk Score (%) 11.15 Filed at 01/17/2023 1600       This score is the patient's risk of an unplanned readmission within 30 days of being discharged (0 -100%). The score is based on dignosis, age, lab data, medications, orders, and past utilization.   Low:  0-14.9   Medium: 15-21.9   High: 22-29.9   Extreme: 30 and above         Discharge Instructions:  Allergies as of 01/17/2023   No Known Allergies      Medication List     STOP taking these medications    hydrochlorothiazide 25 MG tablet Commonly known as: HYDRODIURIL       TAKE these medications    amLODipine 10 MG tablet Commonly known as: NORVASC Take 10 mg by mouth every morning.   atorvastatin 80 MG tablet Commonly known as: LIPITOR Take 1 tablet (80 mg total) by  mouth daily. Start taking on: January 18, 2023   carvedilol 6.25 MG tablet Commonly known as: COREG Take 1 tablet (6.25 mg total) by mouth 2 (two) times daily with a meal.   ipratropium-albuterol 0.5-2.5 (3) MG/3ML Soln Commonly known as: DUONEB Take 3 mLs by nebulization every 6 (six) hours as needed.   lisinopril 10 MG tablet Commonly known as: ZESTRIL Take 1 tablet (10 mg total) by mouth daily.   ondansetron 4 MG disintegrating  tablet Commonly known as: ZOFRAN-ODT Take 1 tablet (4 mg total) by mouth every 8 (eight) hours as needed.         Follow-up Information     Marisue Ivan, MD Follow up in 1 week(s).   Specialty: Family Medicine Contact information: [address] [facility] [place] [address] [phone]                 No Known Allergies   The results of significant diagnostics from this hospitalization (including imaging, microbiology, ancillary and laboratory) are listed below for reference.   Consultations:   Procedures/Studies: ECHOCARDIOGRAM COMPLETE  Result Date: 01/17/2023    ECHOCARDIOGRAM REPORT   Patient Name:   Kim Munoz Date of Exam: 01/17/2023 Medical Rec #:  [id number]     Height:       61.0 in Accession #:    [id number]    Weight:       200.4 lb Date of Birth:  1955/11/28     BSA:          1.890 m Patient Age:    66 years      BP:           131/80 mmHg Patient Gender: F             HR:           71 bpm. Exam Location:  [facility] Procedure: 2D Echo, Cardiac Doppler and Color Doppler Indications:     NSTEMI  History:         Patient has no prior history of Echocardiogram examinations.                  Acute MI, COPD, Signs/Symptoms:Chest Pain; Risk                  Factors:Hypertension, Diabetes and Current Smoker.  Sonographer:     Mikki Harbor Referring Phys:  [id number] KGMWNUUV A ARIDA Diagnosing Phys: Lorine Bears MD IMPRESSIONS  1. Left ventricular ejection fraction, by estimation, is 55 to 60%. The left ventricle has normal function. The left ventricle has no regional wall motion abnormalities. There is moderate asymmetric left ventricular hypertrophy of the basal-septal segment. Left ventricular diastolic parameters are consistent with Grade I diastolic dysfunction (impaired relaxation).  2. Right ventricular systolic function is normal. The right ventricular size is normal. Tricuspid regurgitation signal is inadequate for assessing PA pressure.  3.  The mitral valve is normal in structure. No evidence of mitral valve regurgitation. No evidence of mitral stenosis. Moderate mitral annular calcification.  4. The aortic valve is normal in structure. Aortic valve regurgitation is not visualized. Aortic valve sclerosis/calcification is present, without any evidence of aortic stenosis.  5. The inferior vena cava is normal in size with greater than 50% respiratory variability, suggesting right atrial pressure of 3 mmHg. FINDINGS  Left Ventricle: Left ventricular ejection fraction, by estimation, is 55 to 60%. The left ventricle has normal function. The left ventricle has no regional wall motion abnormalities. The left  ventricular internal cavity size was normal in size. There is  moderate asymmetric left ventricular hypertrophy of the basal-septal segment. Left ventricular diastolic parameters are consistent with Grade I diastolic dysfunction (impaired relaxation). Right Ventricle: The right ventricular size is normal. No increase in right ventricular wall thickness. Right ventricular systolic function is normal. Tricuspid regurgitation signal is inadequate for assessing PA pressure. Left Atrium: Left atrial size was normal in size. Right Atrium: Right atrial size was normal in size. Pericardium: There is no evidence of pericardial effusion. Mitral Valve: The mitral valve is normal in structure. There is mild calcification of the mitral valve leaflet(s). Moderate mitral annular calcification. No evidence of mitral valve regurgitation. No evidence of mitral valve stenosis. MV peak gradient, 4.2 mmHg. The mean mitral valve gradient is 2.0 mmHg. Tricuspid Valve: The tricuspid valve is normal in structure. Tricuspid valve regurgitation is not demonstrated. No evidence of tricuspid stenosis. Aortic Valve: The aortic valve is normal in structure. Aortic valve regurgitation is not visualized. Aortic valve sclerosis/calcification is present, without any evidence of aortic  stenosis. Aortic valve mean gradient measures 4.0 mmHg. Aortic valve peak  gradient measures 8.0 mmHg. Aortic valve area, by VTI measures 1.67 cm. Pulmonic Valve: The pulmonic valve was normal in structure. Pulmonic valve regurgitation is not visualized. No evidence of pulmonic stenosis. Aorta: The aortic root is normal in size and structure. Venous: The inferior vena cava is normal in size with greater than 50% respiratory variability, suggesting right atrial pressure of 3 mmHg. IAS/Shunts: No atrial level shunt detected by color flow Doppler.  LEFT VENTRICLE PLAX 2D LVIDd:         4.20 cm   Diastology LVIDs:         3.00 cm   LV e' medial:    6.53 cm/s LV PW:         1.30 cm   LV E/e' medial:  9.4 LV IVS:        1.00 cm   LV e' lateral:   9.46 cm/s LVOT diam:     1.90 cm   LV E/e' lateral: 6.5 LV SV:         45 LV SV Index:   24 LVOT Area:     2.84 cm  RIGHT VENTRICLE RV Basal diam:  2.80 cm RV Mid diam:    2.50 cm RV S prime:     9.90 cm/s LEFT ATRIUM             Index        RIGHT ATRIUM           Index LA diam:        3.40 cm 1.80 cm/m   RA Area:     13.20 cm LA Vol (A2C):   40.6 ml 21.48 ml/m  RA Volume:   29.50 ml  15.61 ml/m LA Vol (A4C):   40.9 ml 21.64 ml/m LA Biplane Vol: 41.2 ml 21.79 ml/m  AORTIC VALVE                    PULMONIC VALVE AV Area (Vmax):    2.19 cm     PV Vmax:       1.05 m/s AV Area (Vmean):   1.69 cm     PV Peak grad:  4.4 mmHg AV Area (VTI):     1.67 cm AV Vmax:           141.00 cm/s AV Vmean:  96.000 cm/s AV VTI:            0.271 m AV Peak Grad:      8.0 mmHg AV Mean Grad:      4.0 mmHg LVOT Vmax:         109.00 cm/s LVOT Vmean:        57.200 cm/s LVOT VTI:          0.160 m LVOT/AV VTI ratio: 0.59  AORTA Ao Root diam: 3.00 cm MITRAL VALVE MV Area (PHT): 2.49 cm    SHUNTS MV Area VTI:   1.70 cm    Systemic VTI:  0.16 m MV Peak grad:  4.2 mmHg    Systemic Diam: 1.90 cm MV Mean grad:  2.0 mmHg MV Vmax:       1.02 m/s MV Vmean:      56.8 cm/s MV Decel Time: 305 msec MV  E velocity: 61.30 cm/s MV A velocity: 91.70 cm/s MV E/A ratio:  0.67 Lorine Bears MD Electronically signed by Lorine Bears MD Signature Date/Time: 01/17/2023/4:50:11 PM    Final    CARDIAC CATHETERIZATION  Result Date: 01/17/2023   The left ventricular systolic function is normal.   LV end diastolic pressure is normal.   The left ventricular ejection fraction is 55-65% by visual estimate. 1.  Minimal nonobstructive coronary artery disease. 2.  Normal LV systolic function and normal left ventricular end-diastolic pressure. Recommendations: Continue medical therapy. Suspect that elevated troponin is due to supply demand mismatch in the setting of uncontrolled hypertension. Recommend blood pressure control. Given issues with hydrochlorothiazide, I decreased the dose back to 25 mg daily and switch metoprolol to carvedilol.   MR BRAIN WO CONTRAST  Result Date: 01/15/2023 CLINICAL DATA:  Hypertension, chest pain, visual changes EXAM: MRI HEAD WITHOUT CONTRAST TECHNIQUE: Multiplanar, multiecho pulse sequences of the brain and surrounding structures were obtained without intravenous contrast. COMPARISON:  No prior MRI available, correlation is made with CT head 01/15/2023 FINDINGS: Brain: No restricted diffusion to suggest acute or subacute infarct. No acute hemorrhage, mass, mass effect, or midline shift. No hydrocephalus or extra-axial collection. Normal pituitary and craniocervical junction. Scattered T2 hyperintense signal in the periventricular white matter, likely the sequela of mild chronic small vessel ischemic disease. Vascular: Normal arterial flow voids. Skull and upper cervical spine: Normal marrow signal. Sinuses/Orbits: Clear paranasal sinuses. No acute finding in the orbits. Other: The mastoid air cells are well aerated. IMPRESSION: No acute intracranial process. No evidence of acute or subacute infarct. Electronically Signed   By: Wiliam Ke M.D.   On: 01/15/2023 18:12   CT Head Wo  Contrast  Result Date: 01/15/2023 CLINICAL DATA:  Stroke suspected.  Neural deficit. EXAM: CT HEAD WITHOUT CONTRAST TECHNIQUE: Contiguous axial images were obtained from the base of the skull through the vertex without intravenous contrast. RADIATION DOSE REDUCTION: This exam was performed according to the departmental dose-optimization program which includes automated exposure control, adjustment of the mA and/or kV according to patient size and/or use of iterative reconstruction technique. COMPARISON:  None. FINDINGS: Brain: No evidence of acute infarction, hemorrhage, hydrocephalus, extra-axial collection or mass lesion/mass effect. Vascular: No hyperdense vessel or unexpected calcification. Skull: Normal. Negative for fracture or focal lesion. Sinuses/Orbits: No acute finding. Other: None. IMPRESSION: No acute intracranial pathology. Electronically Signed   By: Ted Mcalpine M.D.   On: 01/15/2023 15:36   CT ABDOMEN PELVIS W CONTRAST  Result Date: 01/04/2023 CLINICAL DATA:  Acute generalized abdominal pain. EXAM: CT ABDOMEN AND PELVIS  WITH CONTRAST TECHNIQUE: Multidetector CT imaging of the abdomen and pelvis was performed using the standard protocol following bolus administration of intravenous contrast. RADIATION DOSE REDUCTION: This exam was performed according to the departmental dose-optimization program which includes automated exposure control, adjustment of the mA and/or kV according to patient size and/or use of iterative reconstruction technique. CONTRAST:  OMNIPAQUE IOHEXOL 300 MG/ML  SOLN COMPARISON:  March 25, 2021. FINDINGS: Lower chest: No acute abnormality. Hepatobiliary: No focal liver abnormality is seen. No gallstones, gallbladder wall thickening, or biliary dilatation. Pancreas: Unremarkable. No pancreatic ductal dilatation or surrounding inflammatory changes. Spleen: Normal in size without focal abnormality. Adrenals/Urinary Tract: Adrenal glands are unremarkable. Kidneys are  normal, without renal calculi, focal lesion, or hydronephrosis. Bladder is unremarkable. Stomach/Bowel: The stomach and appendix are unremarkable. There is severe wall thickening involving the ileum, particularly in the terminal ileum. This is highly concerning for inflammatory colitis such as Crohn's disease. No other bowel dilatation is noted. Vascular/Lymphatic: Aortic atherosclerosis. No enlarged abdominal or pelvic lymph nodes. Reproductive: Status post hysterectomy. No adnexal masses. Other: Small amount of free fluid is noted in the pelvis which most likely is physiologic. Moderate size fat containing periumbilical hernia is noted. Musculoskeletal: No acute or significant osseous findings. IMPRESSION: Severe wall thickening is seen involving the ileum, particularly the terminal ileum, highly concerning for inflammatory colitis such as Crohn's disease. Moderate size fat containing periumbilical hernia. Aortic Atherosclerosis (ICD10-I70.0). Electronically Signed   By: Lupita Raider M.D.   On: 01/04/2023 10:38      Labs: BNP (last 3 results) Recent Labs    06/02/22 0139 01/15/23 1119  BNP 6.2 30.1   Basic Metabolic Panel: Recent Labs  Lab 01/15/23 1119 01/16/23 0323 01/17/23 0450  NA 140 138 138  K 3.4* 3.2* 4.1  CL 105 103 103  CO2 28 27 28   GLUCOSE 137* 125* 97  BUN 9 8 15   CREATININE 0.82 0.71 0.88  CALCIUM 9.4 9.4 9.7  MG  --  2.1 2.1   Liver Function Tests: Recent Labs  Lab 01/15/23 1119 01/16/23 0323  AST 16 14*  ALT 14 13  ALKPHOS 28* 28*  BILITOT 0.9 1.2  PROT 7.1 6.9  ALBUMIN 3.4* 3.5   No results for input(s): "LIPASE", "AMYLASE" in the last 168 hours. No results for input(s): "AMMONIA" in the last 168 hours. CBC: Recent Labs  Lab 01/15/23 1119 01/16/23 0323 01/17/23 0450  WBC 5.7 5.6 6.3  NEUTROABS 3.4  --   --   HGB 13.0 12.5 13.7  HCT 39.2 36.7 40.4  MCV 81.5 80.8 80.0  PLT 287 267 318   Cardiac Enzymes: No results for input(s): "CKTOTAL",  "CKMB", "CKMBINDEX", "TROPONINI" in the last 168 hours. BNP: Invalid input(s): "POCBNP" CBG: No results for input(s): "GLUCAP" in the last 168 hours. D-Dimer No results for input(s): "DDIMER" in the last 72 hours. Hgb A1c Recent Labs    01/15/23 1119  HGBA1C 6.0*   Lipid Profile Recent Labs    01/15/23 1639  CHOL 259*  HDL 30*  LDLCALC 192*  TRIG 186*  CHOLHDL 8.6   Thyroid function studies Recent Labs    01/15/23 1639  TSH 1.444   Anemia work up No results for input(s): "VITAMINB12", "FOLATE", "FERRITIN", "TIBC", "IRON", "RETICCTPCT" in the last 72 hours. Urinalysis    Component Value Date/Time   COLORURINE COLORLESS (A) 01/15/2023 1317   APPEARANCEUR CLEAR (A) 01/15/2023 1317   LABSPEC 1.002 (L) 01/15/2023 1317   PHURINE 6.0 01/15/2023  1317   GLUCOSEU NEGATIVE 01/15/2023 1317   HGBUR NEGATIVE 01/15/2023 1317   BILIRUBINUR NEGATIVE 01/15/2023 1317   KETONESUR NEGATIVE 01/15/2023 1317   PROTEINUR NEGATIVE 01/15/2023 1317   NITRITE NEGATIVE 01/15/2023 1317   LEUKOCYTESUR NEGATIVE 01/15/2023 1317   Sepsis Labs Recent Labs  Lab 01/15/23 1119 01/16/23 0323 01/17/23 0450  WBC 5.7 5.6 6.3   Microbiology No results found for this or any previous visit (from the past 240 hour(s)).   Total time spend on discharging this patient, including the last patient exam, discussing the hospital stay, instructions for ongoing care as it relates to all pertinent caregivers, as well as preparing the medical discharge records, prescriptions, and/or referrals as applicable, is 35 minutes.    Darlin Priestly, MD  [facility] 01/17/2023, 4:58 PM

## 2023-01-17 NOTE — Plan of Care (Signed)

## 2023-01-17 NOTE — Progress Notes (Signed)
Rounding Note    Patient Name: Kim Munoz Date of Encounter: 01/17/2023  [facility] Cardiologist: new Mariah Milling)  Subjective   No further chest pain or shortness of breath.  Cardiac catheterization was done which showed minimal nonobstructive coronary artery disease.  Inpatient Medications    Scheduled Meds:  [MAR Hold] amLODipine  10 mg Oral Daily   [MAR Hold] aspirin EC  81 mg Oral Daily   [MAR Hold] atorvastatin  80 mg Oral Daily   carvedilol  6.25 mg Oral BID WC   hydrochlorothiazide  25 mg Oral Daily   [MAR Hold] sodium chloride flush  3 mL Intravenous Q12H   [MAR Hold] sodium chloride flush  3 mL Intravenous Q12H   sodium chloride flush  3 mL Intravenous Q12H   Continuous Infusions:  sodium chloride     sodium chloride     nitroGLYCERIN Stopped (01/16/23 0812)   PRN Meds: sodium chloride, [MAR Hold] acetaminophen, [MAR Hold] docusate sodium, [MAR Hold] hydrALAZINE, [MAR Hold] ipratropium-albuterol, [MAR Hold] nitroGLYCERIN, [MAR Hold] ondansetron **OR** [MAR Hold] ondansetron (ZOFRAN) IV, [MAR Hold] polyethylene glycol, sodium chloride flush   Vital Signs    Vitals:   01/17/23 0821 01/17/23 0826 01/17/23 0838 01/17/23 0845  BP: 125/89 137/79 (!) 148/90   Pulse: 68 68 64 (!) 58  Resp: 18 15 17 17   Temp:      TempSrc:      SpO2: 97% 95% 97% 97%  Weight:      Height:        Intake/Output Summary (Last 24 hours) at 01/17/2023 0847 Last data filed at 01/17/2023 0650 Gross per 24 hour  Intake 403 ml  Output --  Net 403 ml      01/17/2023    6:42 AM 01/15/2023   11:24 AM 01/04/2023    8:33 AM  Last 3 Weights  Weight (lbs) 200 lb 6.4 oz 196 lb 193 lb  Weight (kg) 90.901 kg 88.905 kg 87.544 kg      Telemetry    Normal sinus rhythm with no significant arrhythmia- Personally Reviewed  ECG    No new EKGs- Personally Reviewed  Physical Exam   GEN: No acute distress.   Neck: No JVD Cardiac: RRR, no murmurs, rubs, or gallops.   Respiratory: Clear to auscultation bilaterally. GI: Soft, nontender, non-distended  MS: No edema; No deformity. Neuro:  Nonfocal  Psych: Normal affect   Labs    High Sensitivity Troponin:   Recent Labs  Lab 01/15/23 1639 01/15/23 1830 01/16/23 0014 01/16/23 0323 01/16/23 0711  TROPONINIHS 287* 468* 603* 478* 343*     Chemistry Recent Labs  Lab 01/15/23 1119 01/16/23 0323 01/17/23 0450  NA 140 138 138  K 3.4* 3.2* 4.1  CL 105 103 103  CO2 28 27 28   GLUCOSE 137* 125* 97  BUN 9 8 15   CREATININE 0.82 0.71 0.88  CALCIUM 9.4 9.4 9.7  MG  --  2.1 2.1  PROT 7.1 6.9  --   ALBUMIN 3.4* 3.5  --   AST 16 14*  --   ALT 14 13  --   ALKPHOS 28* 28*  --   BILITOT 0.9 1.2  --   GFRNONAA >60 >60 >60  ANIONGAP 7 8 7     Lipids  Recent Labs  Lab 01/15/23 1639  CHOL 259*  TRIG 186*  HDL 30*  LDLCALC 192*  CHOLHDL 8.6    Hematology Recent Labs  Lab 01/15/23 1119 01/16/23 0323 01/17/23 0450  WBC 5.7 5.6 6.3  RBC 4.81 4.54 5.05  HGB 13.0 12.5 13.7  HCT 39.2 36.7 40.4  MCV 81.5 80.8 80.0  MCH 27.0 27.5 27.1  MCHC 33.2 34.1 33.9  RDW 14.6 14.8 14.9  PLT 287 267 318   Thyroid  Recent Labs  Lab 01/15/23 1639  TSH 1.444    BNP Recent Labs  Lab 01/15/23 1119  BNP 30.1    DDimer No results for input(s): "DDIMER" in the last 168 hours.   Radiology    CARDIAC CATHETERIZATION  Result Date: 01/17/2023   The left ventricular systolic function is normal.   LV end diastolic pressure is normal.   The left ventricular ejection fraction is 55-65% by visual estimate. 1.  Minimal nonobstructive coronary artery disease. 2.  Normal LV systolic function and normal left ventricular end-diastolic pressure. Recommendations: Continue medical therapy. Suspect that elevated troponin is due to supply demand mismatch in the setting of uncontrolled hypertension. Recommend blood pressure control. Given issues with hydrochlorothiazide, I decreased the dose back to 25 mg daily and switch  metoprolol to carvedilol.   MR BRAIN WO CONTRAST  Result Date: 01/15/2023 CLINICAL DATA:  Hypertension, chest pain, visual changes EXAM: MRI HEAD WITHOUT CONTRAST TECHNIQUE: Multiplanar, multiecho pulse sequences of the brain and surrounding structures were obtained without intravenous contrast. COMPARISON:  No prior MRI available, correlation is made with CT head 01/15/2023 FINDINGS: Brain: No restricted diffusion to suggest acute or subacute infarct. No acute hemorrhage, mass, mass effect, or midline shift. No hydrocephalus or extra-axial collection. Normal pituitary and craniocervical junction. Scattered T2 hyperintense signal in the periventricular white matter, likely the sequela of mild chronic small vessel ischemic disease. Vascular: Normal arterial flow voids. Skull and upper cervical spine: Normal marrow signal. Sinuses/Orbits: Clear paranasal sinuses. No acute finding in the orbits. Other: The mastoid air cells are well aerated. IMPRESSION: No acute intracranial process. No evidence of acute or subacute infarct. Electronically Signed   By: Wiliam Ke M.D.   On: 01/15/2023 18:12   CT Head Wo Contrast  Result Date: 01/15/2023 CLINICAL DATA:  Stroke suspected.  Neural deficit. EXAM: CT HEAD WITHOUT CONTRAST TECHNIQUE: Contiguous axial images were obtained from the base of the skull through the vertex without intravenous contrast. RADIATION DOSE REDUCTION: This exam was performed according to the departmental dose-optimization program which includes automated exposure control, adjustment of the mA and/or kV according to patient size and/or use of iterative reconstruction technique. COMPARISON:  None. FINDINGS: Brain: No evidence of acute infarction, hemorrhage, hydrocephalus, extra-axial collection or mass lesion/mass effect. Vascular: No hyperdense vessel or unexpected calcification. Skull: Normal. Negative for fracture or focal lesion. Sinuses/Orbits: No acute finding. Other: None. IMPRESSION: No  acute intracranial pathology. Electronically Signed   By: Ted Mcalpine M.D.   On: 01/15/2023 15:36    Cardiac Studies   Echocardiogram is pending.  Patient Profile     66 y.o. female with a hx of hypertension, type 2 diabetes, obesity, hyperlipidemia, tobacco use, moderate asthma/COPD who is being seen 01/16/2023 for the evaluation of chest pain at the request of Dr. Fran Lowes.   Assessment & Plan    1.  Elevated troponin in the setting of severely elevated blood pressure.  This is likely due to supply demand mismatch and not myocardial infarction.  Cardiac catheterization was done this morning and showed minimal nonobstructive coronary artery disease.  Recommend treatment of risk factors and blood pressure control.  2.  Essential hypertension: I switched metoprolol to carvedilol for better blood  pressure control.  Continue amlodipine and hydrochlorothiazide.  3.  Hyperlipidemia: LDL was 192.  I agree with high-dose atorvastatin.  4.  Tobacco use: Smoking cessation is advised.  Disposition: The patient can likely be discharged home in the afternoon after getting her echocardiogram if she remains chest pain-free with controlled blood pressure.       For questions or updates, please contact [facility] Please consult [email or web address] for contact info under        Signed, Lorine Bears, MD  01/17/2023, 8:47 AM

## 2023-01-18 ENCOUNTER — Encounter: Payer: Self-pay | Admitting: Cardiovascular Disease

## 2023-02-03 ENCOUNTER — Encounter: Payer: Self-pay | Admitting: Medical

## 2023-02-03 ENCOUNTER — Ambulatory Visit: Payer: Self-pay | Attending: Medical | Admitting: Medical

## 2023-02-03 ENCOUNTER — Other Ambulatory Visit: Payer: Self-pay | Admitting: *Deleted

## 2023-02-03 VITALS — BP 130/80 | HR 57 | Ht 61.0 in | Wt 199.0 lb

## 2023-02-03 DIAGNOSIS — I10 Essential (primary) hypertension: Secondary | ICD-10-CM

## 2023-02-03 DIAGNOSIS — E782 Mixed hyperlipidemia: Secondary | ICD-10-CM

## 2023-02-03 DIAGNOSIS — I214 Non-ST elevation (NSTEMI) myocardial infarction: Secondary | ICD-10-CM

## 2023-02-03 DIAGNOSIS — Z87891 Personal history of nicotine dependence: Secondary | ICD-10-CM

## 2023-02-03 DIAGNOSIS — I2510 Atherosclerotic heart disease of native coronary artery without angina pectoris: Secondary | ICD-10-CM

## 2023-02-03 MED ORDER — ASPIRIN 81 MG PO TBEC: 81.0000 mg | DELAYED_RELEASE_TABLET | Freq: Every day | ORAL | 3 refills | Status: AC

## 2023-02-03 NOTE — Patient Instructions (Signed)
Medication Instructions:  Your physician has recommended you make the following change in your medication:   START - aspirin EC 81 MG tablet - Take 1 tablet (81 mg total) by mouth daily. Swallow whole  *If you need a refill on your cardiac medications before your next appointment, please call your pharmacy*   Lab Work: -None ordered If you have labs (blood work) drawn today and your tests are completely normal, you will receive your results only by: MyChart Message (if you have MyChart) OR A paper copy in the mail If you have any lab test that is abnormal or we need to change your treatment, we will call you to review the results.   Testing/Procedures: -None ordered   Follow-Up: At [facility], you and your health needs are our priority.  As part of our continuing mission to provide you with exceptional heart care, we have created designated Provider Care Teams.  These Care Teams include your primary Cardiologist (physician) and Advanced Practice Providers (APPs -  Physician Assistants and Nurse Practitioners) who all work together to provide you with the care you need, when you need it.  We recommend signing up for the patient portal called "MyChart".  Sign up information is provided on this After Visit Summary.  MyChart is used to connect with patients for Virtual Visits (Telemedicine).  Patients are able to view lab/test results, encounter notes, upcoming appointments, etc.  Non-urgent messages can be sent to your provider as well.   To learn more about what you can do with MyChart, go to [email or web address].    Your next appointment:   2 month(s)  Provider:   You may see Lorine Bears, MD or one of the following Advanced Practice Providers on your designated Care Team:   Nicolasa Ducking, NP Eula Listen, PA-C Cadence Fransico Michael, PA-C Charlsie Quest, NP    Other Instructions AMB referral to cardiac rehabilitation

## 2023-02-03 NOTE — Progress Notes (Signed)
Cardiology Office Note:    Date:  02/03/2023   ID:  Kim Munoz, DOB 1956-03-28, MRN [id number]  PCP:  Marisue Ivan, MD  [facility] Cardiologist:  Lorine Bears, MD  [facility] Electrophysiologist:  None   Referring MD: Marisue Ivan, MD   Chief Complaint: Hospital follow-up  History of Present Illness:    Kim Munoz is a 67 y.o. female with a hx of hypertension, type 2 diabetes, obesity, nonobstructive CAD, hyperlipidemia, tobacco use, moderate asthma/COPD who is being seen for hospital follow-up.  The patient was admitted in April with chest pain, hypertensive emergency and troponin elevation secondary to demand ischemia.  Patient resenting with chest pain and blurry vision.  High-sensitivity troponin peaked to 603 started on IV heparin.  Diet the brain was negative.  Cardiac cath showed minimal nonobstructive CAD.  Echo was normal.  Patient was started on Coreg and home amlodipine was continued.  Today, the patient repots she is overall doing well. She started back at work almost a week ago. She is mostly sedentary at work. She has rare chest pain and shortness of breath.  She is not checking BP at home. She is not taking Aspirin. Diet and exercise discussed. She quit smoking.   Past Medical History:  Diagnosis Date   Complication of anesthesia    nausea after colonoscopy   Family history of adverse reaction to anesthesia    sister - nausea aafter surgery   Hypertension    Wears dentures    full upper and lower    Past Surgical History:  Procedure Laterality Date   ABDOMINAL HYSTERECTOMY     COLONOSCOPY     COLONOSCOPY WITH PROPOFOL N/A 01/13/2021   Procedure: COLONOSCOPY WITH PROPOFOL;  Surgeon: Toney Reil, MD;  Location: [facility] ENDOSCOPY;  Service: Gastroenterology;  Laterality: N/A;   GIVENS CAPSULE STUDY N/A 01/27/2021   Procedure: GIVENS CAPSULE STUDY;  Surgeon: Toney Reil, MD;  Location: [facility] ENDOSCOPY;  Service: Gastroenterology;   Laterality: N/A;   HALLUX VALGUS LAPIDUS Right 04/14/2016   Procedure: HALLUX VALGUS LAPIDUS CORRECTION RIGHT FOOT;  Surgeon: Gwyneth Revels, DPM;  Location: [facility];  Service: Podiatry;  Laterality: Right;  WITH POPLITEAL BLOCK   HALLUX VALGUS LAPIDUS Right 08/20/2016   Procedure: HALLUX VALGUS LAPIDUS;  Surgeon: Gwyneth Revels, DPM;  Location: [facility] ORS;  Service: Podiatry;  Laterality: Right;   LEFT HEART CATH AND CORONARY ANGIOGRAPHY N/A 01/17/2023   Procedure: LEFT HEART CATH AND CORONARY ANGIOGRAPHY;  Surgeon: Iran Ouch, MD;  Location: [facility] INVASIVE CV LAB;  Service: Cardiovascular;  Laterality: N/A;    Current Medications: Current Meds  Medication Sig   amLODipine (NORVASC) 10 MG tablet Take 10 mg by mouth every morning.    aspirin EC 81 MG tablet Take 1 tablet (81 mg total) by mouth daily. Swallow whole.   atorvastatin (LIPITOR) 80 MG tablet Take 1 tablet (80 mg total) by mouth daily.   carvedilol (COREG) 6.25 MG tablet Take 1 tablet (6.25 mg total) by mouth 2 (two) times daily with a meal.   ipratropium-albuterol (DUONEB) 0.5-2.5 (3) MG/3ML SOLN Take 3 mLs by nebulization every 6 (six) hours as needed.   lisinopril (ZESTRIL) 10 MG tablet Take 1 tablet (10 mg total) by mouth daily.   ondansetron (ZOFRAN-ODT) 4 MG disintegrating tablet Take 1 tablet (4 mg total) by mouth every 8 (eight) hours as needed.     Allergies:   Patient has no known allergies.   Social History   Socioeconomic History  Marital status: Single    Spouse name: Not on file   Number of children: Not on file   Years of education: Not on file   Highest education level: Not on file  Occupational History   Not on file  Tobacco Use   Smoking status: Former    Packs/day: 0.50    Years: 35.00    Additional pack years: 0.00    Total pack years: 17.50    Types: Cigarettes   Smokeless tobacco: Never  Vaping Use   Vaping Use: Never used  Substance and Sexual Activity   Alcohol use: Yes     Comment: 2 drinks/mo   Drug use: Never   Sexual activity: Not on file  Other Topics Concern   Not on file  Social History Narrative   Not on file   Social Determinants of Health   Financial Resource Strain: Not on file  Food Insecurity: No Food Insecurity (01/16/2023)   Hunger Vital Sign    Worried About Running Out of Food in the Last Year: Never true    Ran Out of Food in the Last Year: Never true  Transportation Needs: No Transportation Needs (01/16/2023)   PRAPARE - Administrator, Civil Service (Medical): No    Lack of Transportation (Non-Medical): No  Physical Activity: Not on file  Stress: Not on file  Social Connections: Not on file     Family History: The patient's family history includes Kidney disease in her mother; Thyroid cancer in her sister.  ROS:   Please see the history of present illness.     All other systems reviewed and are negative.  EKGs/Labs/Other Studies Reviewed:    The following studies were reviewed today:  Echo 12/2022  1. Left ventricular ejection fraction, by estimation, is 55 to 60%. The  left ventricle has normal function. The left ventricle has no regional  wall motion abnormalities. There is moderate asymmetric left ventricular  hypertrophy of the basal-septal  segment. Left ventricular diastolic parameters are consistent with Grade I  diastolic dysfunction (impaired relaxation).   2. Right ventricular systolic function is normal. The right ventricular  size is normal. Tricuspid regurgitation signal is inadequate for assessing  PA pressure.   3. The mitral valve is normal in structure. No evidence of mitral valve  regurgitation. No evidence of mitral stenosis. Moderate mitral annular  calcification.   4. The aortic valve is normal in structure. Aortic valve regurgitation is  not visualized. Aortic valve sclerosis/calcification is present, without  any evidence of aortic stenosis.   5. The inferior vena cava is normal in  size with greater than 50%  respiratory variability, suggesting right atrial pressure of 3 mmHg.   Cardiac cath 12/2022   The left ventricular systolic function is normal.   LV end diastolic pressure is normal.   The left ventricular ejection fraction is 55-65% by visual estimate.   1.  Minimal nonobstructive coronary artery disease. 2.  Normal LV systolic function and normal left ventricular end-diastolic pressure.   Recommendations: Continue medical therapy. Suspect that elevated troponin is due to supply demand mismatch in the setting of uncontrolled hypertension. Recommend blood pressure control. Given issues with hydrochlorothiazide, I decreased the dose back to 25 mg daily and switch metoprolol to carvedilol.  EKG:  EKG is ordered today.  The ekg ordered today demonstrates sinus bradycardia, 57 bpm,, no significant ST or T wave changes  Recent Labs: 01/15/2023: B Natriuretic Peptide 30.1; TSH 1.444 01/16/2023: ALT 13 01/17/2023:  BUN 15; Creatinine, Ser 0.88; Hemoglobin 13.7; Magnesium 2.1; Platelets 318; Potassium 4.1; Sodium 138  Recent Lipid Panel    Component Value Date/Time   CHOL 259 (H) 01/15/2023 1639   TRIG 186 (H) 01/15/2023 1639   HDL 30 (L) 01/15/2023 1639   CHOLHDL 8.6 01/15/2023 1639   VLDL 37 01/15/2023 1639   LDLCALC 192 (H) 01/15/2023 1639     Physical Exam:    VS:  BP 130/80 (BP Location: Left Arm, Patient Position: Sitting, Cuff Size: Large)   Pulse (!) 57   Ht 5\' 1"  (1.549 m)   Wt 199 lb (90.3 kg)   SpO2 95%   BMI 37.60 kg/m     Wt Readings from Last 3 Encounters:  02/03/23 199 lb (90.3 kg)  01/17/23 200 lb 6.4 oz (90.9 kg)  01/04/23 193 lb (87.5 kg)     GEN:  Well nourished, well developed in no acute distress HEENT: Normal NECK: No JVD; No carotid bruits LYMPHATICS: No lymphadenopathy CARDIAC: RRR, no murmurs, rubs, gallops RESPIRATORY:  Clear to auscultation without rales, wheezing or rhonchi  ABDOMEN: Soft, non-tender,  non-distended MUSCULOSKELETAL:  No edema; No deformity  SKIN: Warm and dry NEUROLOGIC:  Alert and oriented x 3 PSYCHIATRIC:  Normal affect   ASSESSMENT:    1. Non-ST elevation (NSTEMI) myocardial infarction (HCC)   2. Coronary artery disease involving native coronary artery of native heart without angina pectoris   3. Essential hypertension   4. Hyperlipidemia, mixed   5. History of tobacco use    PLAN:    In order of problems listed above:  NSTEMI/Elevated troponin  Nonobstructive CAD Recent admission for chest pain, hypertensive urgency, troponin elevation secondary to demand ischemia.  Cardiac cath showed nonobstructive CAD.  Echo showed echo showed EF 55 to 60% moderate LVH, grade 1 diastolic dysfunction.  Patient was sent home with amlodipine 10 mg daily, Lipitor 80 mg daily, Coreg 6.25 mg daily, lisinopril 10 mg daily.  Patient is overall doing well at home.  She reports rare chest spasm with minimal shortness of breath.  Patient is back at work and is taking it easy.  Cath site, right radial, is stable.  EKG today shows normal sinus rhythm with no ischemic changes. I will refer patient to cardiac rehab.  I recommended she start aspirin 81 mg daily  HTN Blood pressure is good today.  Continue amlodipine, Coreg, and lisinopril.  HLD LDL 192.  Patient was started on Lipitor 80 mg daily.  Plan to update LFT/lipid panel at follow-up.  Tobacco use Patient quit smoking.  Disposition: Follow up in 1 month(s) with MD/APP     Signed, Reah Justo David Stall, PA-C  02/03/2023 9:55 AM    [facility]

## 2023-04-05 ENCOUNTER — Ambulatory Visit: Payer: Self-pay | Admitting: Medical

## 2023-05-17 ENCOUNTER — Ambulatory Visit: Payer: Medicare Other | Attending: Medical | Admitting: Medical

## 2023-05-17 ENCOUNTER — Encounter: Payer: Self-pay | Admitting: Medical

## 2023-05-17 VITALS — BP 144/86 | HR 65 | Ht 61.0 in | Wt 206.6 lb

## 2023-05-17 DIAGNOSIS — I10 Essential (primary) hypertension: Secondary | ICD-10-CM | POA: Diagnosis not present

## 2023-05-17 DIAGNOSIS — Z87891 Personal history of nicotine dependence: Secondary | ICD-10-CM

## 2023-05-17 DIAGNOSIS — I2510 Atherosclerotic heart disease of native coronary artery without angina pectoris: Secondary | ICD-10-CM

## 2023-05-17 DIAGNOSIS — I214 Non-ST elevation (NSTEMI) myocardial infarction: Secondary | ICD-10-CM

## 2023-05-17 DIAGNOSIS — E782 Mixed hyperlipidemia: Secondary | ICD-10-CM | POA: Diagnosis not present

## 2023-05-17 DIAGNOSIS — Z79899 Other long term (current) drug therapy: Secondary | ICD-10-CM

## 2023-05-17 MED ORDER — AMLODIPINE BESYLATE 10 MG PO TABS: 10.0000 mg | ORAL_TABLET | ORAL | 3 refills | Status: DC

## 2023-05-17 MED ORDER — ATORVASTATIN CALCIUM 80 MG PO TABS: 80.0000 mg | ORAL_TABLET | Freq: Every day | ORAL | 3 refills | Status: AC

## 2023-05-17 MED ORDER — LISINOPRIL 20 MG PO TABS: 20.0000 mg | ORAL_TABLET | Freq: Every day | ORAL | 3 refills | Status: DC

## 2023-05-17 MED ORDER — NITROGLYCERIN 0.4 MG SL SUBL: 0.4000 mg | SUBLINGUAL_TABLET | SUBLINGUAL | 3 refills | Status: AC | PRN

## 2023-05-17 MED ORDER — CARVEDILOL 6.25 MG PO TABS: 6.2500 mg | ORAL_TABLET | Freq: Two times a day (BID) | ORAL | 3 refills | Status: DC

## 2023-05-17 NOTE — Progress Notes (Signed)
Cardiology Office Note:    Date:  05/17/2023   ID:  Kim Munoz, DOB [birth year], MRN [id number]  PCP:  Kim Ivan, MD  [facility] Cardiologist:  Kim Bears, MD  [facility] Electrophysiologist:  None   Referring MD: Kim Ivan, MD   Chief Complaint: 3 month follow-up  History of Present Illness:    Kim Munoz is a 67 y.o. female with a hx of hypertension, type 2 diabetes, obesity, nonobstructive CAD, hyperlipidemia, tobacco use, moderate asthma/COPD who is being seen for 3 month follow-up.   The patient was admitted in April with chest pain, hypertensive emergency and troponin elevation secondary to demand ischemia.  Patient presented with chest pain and blurry vision.  High-sensitivity troponin peaked to 603 started on IV heparin.  Cardiac cath showed minimal nonobstructive CAD.  Echo was normal.  Patient was started on Coreg, Lipitor, Lisinopril and PTA amlodipine was continued.  Patient was last seen 02/03/2023 and was overall doing well.  She reported rare chest pain and shortness of breath. It was recommended she take Aspirin and was referred to cardiac rehab.  Today, the patient reports she is overall doing well. She has occasional spasm. It is painful. And may occur once a month. She is back to baseline function. She has otitis media which is affecting her balance. Rare lower leg edema at the end of the day. She quit smoking. BP is a little high today. She doesn't check BP at home.    Past Medical History:  Diagnosis Date   Complication of anesthesia    nausea after colonoscopy   Family history of adverse reaction to anesthesia    sister - nausea aafter surgery   Hypertension    Wears dentures    full upper and lower    Past Surgical History:  Procedure Laterality Date   ABDOMINAL HYSTERECTOMY     COLONOSCOPY     COLONOSCOPY WITH PROPOFOL N/A 01/13/2021   Procedure: COLONOSCOPY WITH PROPOFOL;  Surgeon: Toney Reil, MD;  Location:  [facility] ENDOSCOPY;  Service: Gastroenterology;  Laterality: N/A;   GIVENS CAPSULE STUDY N/A 01/27/2021   Procedure: GIVENS CAPSULE STUDY;  Surgeon: Toney Reil, MD;  Location: [facility] ENDOSCOPY;  Service: Gastroenterology;  Laterality: N/A;   HALLUX VALGUS LAPIDUS Right 04/14/2016   Procedure: HALLUX VALGUS LAPIDUS CORRECTION RIGHT FOOT;  Surgeon: Kim Munoz, DPM;  Location: [facility];  Service: Podiatry;  Laterality: Right;  WITH POPLITEAL BLOCK   HALLUX VALGUS LAPIDUS Right 08/20/2016   Procedure: HALLUX VALGUS LAPIDUS;  Surgeon: Kim Munoz, DPM;  Location: [facility] ORS;  Service: Podiatry;  Laterality: Right;   LEFT HEART CATH AND CORONARY ANGIOGRAPHY N/A 01/17/2023   Procedure: LEFT HEART CATH AND CORONARY ANGIOGRAPHY;  Surgeon: Kim Ouch, MD;  Location: [facility] INVASIVE CV LAB;  Service: Cardiovascular;  Laterality: N/A;    Current Medications: Current Meds  Medication Sig   aspirin EC 81 MG tablet Take 1 tablet (81 mg total) by mouth daily. Swallow whole.   doxycycline (VIBRA-TABS) 100 MG tablet Take 100 mg by mouth 2 (two) times daily.   ipratropium-albuterol (DUONEB) 0.5-2.5 (3) MG/3ML SOLN Take 3 mLs by nebulization every 6 (six) hours as needed.   nitroGLYCERIN (NITROSTAT) 0.4 MG SL tablet Place 1 tablet (0.4 mg total) under the tongue every 5 (five) minutes as needed for chest pain.   ondansetron (ZOFRAN-ODT) 4 MG disintegrating tablet Take 1 tablet (4 mg total) by mouth every 8 (eight) hours as needed.   [DISCONTINUED] amLODipine (NORVASC)  10 MG tablet Take 10 mg by mouth every morning.    [DISCONTINUED] atorvastatin (LIPITOR) 80 MG tablet Take 1 tablet (80 mg total) by mouth daily.   [DISCONTINUED] carvedilol (COREG) 6.25 MG tablet Take 1 tablet (6.25 mg total) by mouth 2 (two) times daily with a meal.   [DISCONTINUED] lisinopril (ZESTRIL) 10 MG tablet Take 1 tablet (10 mg total) by mouth daily.     Allergies:   Patient has no known allergies.   Social History    Socioeconomic History   Marital status: Single    Spouse name: Not on file   Number of children: Not on file   Years of education: Not on file   Highest education level: Not on file  Occupational History   Not on file  Tobacco Use   Smoking status: Former    Current packs/day: 0.50    Average packs/day: 0.5 packs/day for 35.0 years (17.5 ttl pk-yrs)    Types: Cigarettes   Smokeless tobacco: Never  Vaping Use   Vaping status: Never Used  Substance and Sexual Activity   Alcohol use: Yes    Comment: 2 drinks/mo   Drug use: Never   Sexual activity: Not on file  Other Topics Concern   Not on file  Social History Narrative   Not on file   Social Determinants of Health   Financial Resource Strain: Not on file  Food Insecurity: No Food Insecurity (01/16/2023)   Hunger Vital Sign    Worried About Running Out of Food in the Last Year: Never true    Ran Out of Food in the Last Year: Never true  Transportation Needs: No Transportation Needs (01/16/2023)   PRAPARE - Administrator, Civil Service (Medical): No    Lack of Transportation (Non-Medical): No  Physical Activity: Not on file  Stress: Not on file  Social Connections: Not on file     Family History: The patient's family history includes Kidney disease in her mother; Thyroid cancer in her sister.  ROS:   Please see the history of present illness.     All other systems reviewed and are negative.  EKGs/Labs/Other Studies Reviewed:    The following studies were reviewed today:   Echo 12/2022  1. Left ventricular ejection fraction, by estimation, is 55 to 60%. The  left ventricle has normal function. The left ventricle has no regional  wall motion abnormalities. There is moderate asymmetric left ventricular  hypertrophy of the basal-septal  segment. Left ventricular diastolic parameters are consistent with Grade I  diastolic dysfunction (impaired relaxation).   2. Right ventricular systolic function is  normal. The right ventricular  size is normal. Tricuspid regurgitation signal is inadequate for assessing  PA pressure.   3. The mitral valve is normal in structure. No evidence of mitral valve  regurgitation. No evidence of mitral stenosis. Moderate mitral annular  calcification.   4. The aortic valve is normal in structure. Aortic valve regurgitation is  not visualized. Aortic valve sclerosis/calcification is present, without  any evidence of aortic stenosis.   5. The inferior vena cava is normal in size with greater than 50%  respiratory variability, suggesting right atrial pressure of 3 mmHg.    Cardiac cath 12/2022   The left ventricular systolic function is normal.   LV end diastolic pressure is normal.   The left ventricular ejection fraction is 55-65% by visual estimate.   1.  Minimal nonobstructive coronary artery disease. 2.  Normal LV systolic function and  normal left ventricular end-diastolic pressure.   Recommendations: Continue medical therapy. Suspect that elevated troponin is due to supply demand mismatch in the setting of uncontrolled hypertension. Recommend blood pressure control. Given issues with hydrochlorothiazide, I decreased the dose back to 25 mg daily and switch metoprolol to carvedilol.  EKG:  EKG is not ordered today.     Recent Labs: 01/15/2023: B Natriuretic Peptide 30.1; TSH 1.444 01/16/2023: ALT 13 01/17/2023: BUN 15; Creatinine, Ser 0.88; Hemoglobin 13.7; Magnesium 2.1; Platelets 318; Potassium 4.1; Sodium 138  Recent Lipid Panel    Component Value Date/Time   CHOL 259 (H) 01/15/2023 1639   TRIG 186 (H) 01/15/2023 1639   HDL 30 (L) 01/15/2023 1639   CHOLHDL 8.6 01/15/2023 1639   VLDL 37 01/15/2023 1639   LDLCALC 192 (H) 01/15/2023 1639     Physical Exam:    VS:  BP (!) 144/86 (BP Location: Left Arm, Patient Position: Sitting, Cuff Size: Normal)   Pulse 65   Ht 5\' 1"  (1.549 m)   Wt 206 lb 9.6 oz (93.7 kg)   SpO2 100%   BMI 39.04 kg/m      Wt Readings from Last 3 Encounters:  05/17/23 206 lb 9.6 oz (93.7 kg)  02/03/23 199 lb (90.3 kg)  01/17/23 200 lb 6.4 oz (90.9 kg)     GEN:  Well nourished, well developed in no acute distress HEENT: Normal NECK: No JVD; No carotid bruits LYMPHATICS: No lymphadenopathy CARDIAC: RRR, no murmurs, rubs, gallops RESPIRATORY:  Clear to auscultation without rales, wheezing or rhonchi  ABDOMEN: Soft, non-tender, non-distended MUSCULOSKELETAL:  No edema; No deformity  SKIN: Warm and dry NEUROLOGIC:  Alert and oriented x 3 PSYCHIATRIC:  Normal affect   ASSESSMENT:    1. Non-ST elevation (NSTEMI) myocardial infarction (HCC)   2. Coronary artery disease involving native coronary artery of native heart without angina pectoris   3. Essential hypertension   4. Hyperlipidemia, mixed   5. History of tobacco use   6. Medication management    PLAN:    In order of problems listed above:  NSTEMI Non-obstructive CAD Patient was previously admitted in April 2024 for chest pain, hypertensive urgency, troponin elevation secondary to demand ischemia.  Cardiac cath showed nonobstructive CAD.  Echo showed normal EF with grade 1 diastolic dysfunction.  Patient overall is doing well with rare chest pain.  She plans on starting cardiac rehab.  I will refill her cardiac medications today.  Continue amlodipine 10 mg daily, Lipitor 80 mg daily, Coreg 6.25 mg daily, aspirin 81 mg daily.  Hypertension Blood pressure is mildly elevated today.  I will increase lisinopril to 20 mg daily.  Continue amlodipine and Coreg.  HLD LDL 192.  Recheck lipids and LFTs today.  Refill Lipitor as above.  Tobacco use Patient quit smoking.  Disposition: Follow up in 4 month(s) with MD  Signed, Gerrie Castiglia David Stall, PA-C  05/17/2023 8:24 AM    [facility]

## 2023-05-17 NOTE — Patient Instructions (Signed)
Medication Instructions:  Your physician recommends the following medication changes.  START TAKING: For as needed Nitroglycerin, if you develop chest pain: Sit and rest 5 minutes. If chest pain does not resolve place 1 nitroglycerin under your tongue and wait 5 minutes. If chest pain does not resolve, place a 2nd nitroglycerin under your tongue and wait 5 more minutes. If chest pain does not resolve, place a 3rd nitroglycerin under your tongue and seek emergency services.    INCREASE: Lisinopril to 20 mg by mouth daily  *If you need a refill on your cardiac medications before your next appointment, please call your pharmacy*   Lab Work: Your provider would like for you to have following labs drawn today (Lipid, LFT).     Testing/Procedures: No test ordered today    Follow-Up: At [facility], you and your health needs are our priority.  As part of our continuing mission to provide you with exceptional heart care, we have created designated Provider Care Teams.  These Care Teams include your primary Cardiologist (physician) and Advanced Practice Providers (APPs -  Physician Assistants and Nurse Practitioners) who all work together to provide you with the care you need, when you need it.  We recommend signing up for the patient portal called "MyChart".  Sign up information is provided on this After Visit Summary.  MyChart is used to connect with patients for Virtual Visits (Telemedicine).  Patients are able to view lab/test results, encounter notes, upcoming appointments, etc.  Non-urgent messages can be sent to your provider as well.   To learn more about what you can do with MyChart, go to [email or web address].    Your next appointment:   4 month(s)  Provider:   Lorine Bears, MD

## 2023-05-18 LAB — LIPID PANEL
Chol/HDL Ratio: 7.3 ratio — ABNORMAL HIGH (ref 0.0–4.4)
Cholesterol, Total: 341 mg/dL — ABNORMAL HIGH (ref 100–199)
HDL: 47 mg/dL (ref >39)
LDL Chol Calc (NIH): 257 mg/dL — ABNORMAL HIGH (ref 0–99)
Triglycerides: 184 mg/dL — ABNORMAL HIGH (ref 0–149)
VLDL Cholesterol Cal: 37 mg/dL (ref 5–40)

## 2023-05-18 LAB — HEPATIC FUNCTION PANEL
ALT: 14 IU/L (ref 0–32)
AST: 11 IU/L (ref 0–40)
Albumin: 4.1 g/dL (ref 3.9–4.9)
Alkaline Phosphatase: 37 IU/L — ABNORMAL LOW (ref 44–121)
Bilirubin Total: 0.6 mg/dL (ref 0.0–1.2)
Bilirubin, Direct: 0.12 mg/dL (ref 0.00–0.40)
Total Protein: 6.9 g/dL (ref 6.0–8.5)

## 2023-05-19 ENCOUNTER — Other Ambulatory Visit: Payer: Self-pay

## 2023-05-19 DIAGNOSIS — Z79899 Other long term (current) drug therapy: Secondary | ICD-10-CM

## 2023-06-12 ENCOUNTER — Emergency Department: Payer: Medicare Other

## 2023-06-12 ENCOUNTER — Emergency Department
Admission: EM | Admit: 2023-06-12 | Discharge: 2023-06-12 | Disposition: A | Discharge status: Home / Self Care | Payer: Medicare Other | Attending: Emergency Medicine | Admitting: Emergency Medicine

## 2023-06-12 ENCOUNTER — Other Ambulatory Visit: Payer: Self-pay

## 2023-06-12 DIAGNOSIS — J449 Chronic obstructive pulmonary disease, unspecified: Secondary | ICD-10-CM | POA: Insufficient documentation

## 2023-06-12 DIAGNOSIS — R42 Dizziness and giddiness: Secondary | ICD-10-CM | POA: Diagnosis present

## 2023-06-12 DIAGNOSIS — E119 Type 2 diabetes mellitus without complications: Secondary | ICD-10-CM | POA: Insufficient documentation

## 2023-06-12 DIAGNOSIS — M542 Cervicalgia: Secondary | ICD-10-CM | POA: Insufficient documentation

## 2023-06-12 DIAGNOSIS — I10 Essential (primary) hypertension: Secondary | ICD-10-CM | POA: Diagnosis not present

## 2023-06-12 LAB — BASIC METABOLIC PANEL
Anion gap: 9 (ref 5–15)
BUN: 15 mg/dL (ref 8–23)
CO2: 23 mmol/L (ref 22–32)
Calcium: 9.2 mg/dL (ref 8.9–10.3)
Chloride: 104 mmol/L (ref 98–111)
Creatinine, Ser: 0.94 mg/dL (ref 0.44–1.00)
GFR, Estimated: >60 mL/min (ref >60)
Glucose, Bld: 132 mg/dL — ABNORMAL HIGH (ref 70–99)
Potassium: 3.7 mmol/L (ref 3.5–5.1)
Sodium: 136 mmol/L (ref 135–145)

## 2023-06-12 LAB — CBC
HCT: 39.2 % (ref 36.0–46.0)
Hemoglobin: 13.2 g/dL (ref 12.0–15.0)
MCH: 27.7 pg (ref 26.0–34.0)
MCHC: 33.7 g/dL (ref 30.0–36.0)
MCV: 82.2 fL (ref 80.0–100.0)
Platelets: 283 10*3/uL (ref 150–400)
RBC: 4.77 MIL/uL (ref 3.87–5.11)
RDW: 13.9 % (ref 11.5–15.5)
WBC: 8.4 10*3/uL (ref 4.0–10.5)
nRBC: 0 % (ref 0.0–0.2)

## 2023-06-12 LAB — URINALYSIS, ROUTINE W REFLEX MICROSCOPIC
Bacteria, UA: NONE SEEN
Bilirubin Urine: NEGATIVE
Glucose, UA: NEGATIVE mg/dL
Ketones, ur: NEGATIVE mg/dL
Leukocytes,Ua: NEGATIVE
Nitrite: NEGATIVE
Protein, ur: NEGATIVE mg/dL
Specific Gravity, Urine: 1.038 — ABNORMAL HIGH (ref 1.005–1.030)
pH: 5 (ref 5.0–8.0)

## 2023-06-12 MED ORDER — MECLIZINE HCL 25 MG PO TABS: 25.0000 mg | ORAL_TABLET | Freq: Three times a day (TID) | ORAL | 0 refills | Status: AC | PRN

## 2023-06-12 MED ORDER — KETOROLAC TROMETHAMINE 30 MG/ML IJ SOLN
15.0000 mg | Freq: Once | INTRAMUSCULAR | Status: AC
  Administered 2023-06-12: 15 mg via INTRAVENOUS
  Filled 2023-06-12: qty 1

## 2023-06-12 MED ORDER — DIPHENHYDRAMINE HCL 50 MG/ML IJ SOLN
25.0000 mg | Freq: Once | INTRAMUSCULAR | Status: AC
  Administered 2023-06-12: 25 mg via INTRAVENOUS
  Filled 2023-06-12: qty 1

## 2023-06-12 MED ORDER — PROCHLORPERAZINE EDISYLATE 10 MG/2ML IJ SOLN
5.0000 mg | Freq: Once | INTRAMUSCULAR | Status: AC
  Administered 2023-06-12: 5 mg via INTRAVENOUS
  Filled 2023-06-12: qty 2

## 2023-06-12 MED ORDER — IOHEXOL 350 MG/ML SOLN
75.0000 mL | Freq: Once | INTRAVENOUS | Status: AC | PRN
  Administered 2023-06-12: 75 mL via INTRAVENOUS

## 2023-06-12 NOTE — ED Notes (Signed)
Pt ataxic when ambulating to the bathroom.

## 2023-06-12 NOTE — ED Triage Notes (Addendum)
Pt reports posterior neck pain since Thursday with no known injury. Pt reports being dizzy through the night and woke up tonight and fell, pt denies any injury from fall and denies hitting head. Pt reports persistent dizziness currently. Pt has equal grip strengths, no neuro deficits noted.

## 2023-06-12 NOTE — ED Provider Notes (Signed)
[facility] Provider Note    Event Date/Time   First MD Initiated Contact with Patient 06/12/23 [phone]     (approximate)   History   Chief Complaint Dizziness   HPI  Kim Munoz is a 67 y.o. female with past medical history of hypertension, hyperlipidemia, diabetes, and COPD who presents to the ED complaining of dizziness.  Patient reports that overnight she developed significant dizziness and sensation that the room is spinning around her.  She states that this is worse with changes in position, she denies any vision changes, speech changes, numbness, or weakness in her extremities.  She does state that she has been dealing with a few days of left posterior neck pain, denies any traumatic injury.  She has not had any fevers or neck stiffness.     Physical Exam   Triage Vital Signs: ED Triage Vitals [06/12/23 0702]  Encounter Vitals Group     BP (!) 155/93     Systolic BP Percentile      Diastolic BP Percentile      Pulse Rate 71     Resp 16     Temp 98.2 F (36.8 C)     Temp src      SpO2 100 %     Weight 196 lb (88.9 kg)     Height      Head Circumference      Peak Flow      Pain Score 10     Pain Loc      Pain Education      Exclude from Growth Chart     Most recent vital signs: Vitals:   06/12/23 0930 06/12/23 1030  BP: 124/70 131/81  Pulse: 61 (!) 57  Resp: 17 (!) 24  Temp:    SpO2: 96% 96%    Constitutional: Alert and oriented. Eyes: Conjunctivae are normal. Head: Atraumatic. Nose: No congestion/rhinnorhea. Mouth/Throat: Mucous membranes are moist.  Neck: Supple with no meningismus, no midline cervical spine tenderness to palpation. Cardiovascular: Normal rate, regular rhythm. Grossly normal heart sounds.  2+ radial pulses bilaterally. Respiratory: Normal respiratory effort.  No retractions. Lungs CTAB. Gastrointestinal: Soft and nontender. No distention. Musculoskeletal: No lower extremity tenderness nor edema.   Neurologic:  Normal speech and language. No gross focal neurologic deficits are appreciated.    ED Results / Procedures / Treatments   Labs (all labs ordered are listed, but only abnormal results are displayed) Labs Reviewed  BASIC METABOLIC PANEL - Abnormal; Notable for the following components:      Result Value   Glucose, Bld 132 (*)    All other components within normal limits  URINALYSIS, ROUTINE W REFLEX MICROSCOPIC - Abnormal; Notable for the following components:   Color, Urine STRAW (*)    APPearance CLEAR (*)    Specific Gravity, Urine 1.038 (*)    Hgb urine dipstick SMALL (*)    All other components within normal limits  CBC  CBG MONITORING, ED     EKG  ED ECG REPORT I, Chesley Noon, the attending physician, personally viewed and interpreted this ECG.   Date: 06/12/2023  EKG Time: 7:10  Rate: 67  Rhythm: normal sinus rhythm  Axis: Normal  Intervals:none  ST&T Change: None  RADIOLOGY CT head reviewed and interpreted by me with no hemorrhage or midline shift.  PROCEDURES:  Critical Care performed: No  Procedures   MEDICATIONS ORDERED IN ED: Medications  ketorolac (TORADOL) 30 MG/ML injection 15 mg (15 mg Intravenous Given  06/12/23 1610)  prochlorperazine (COMPAZINE) injection 5 mg (5 mg Intravenous Given 06/12/23 0841)  diphenhydrAMINE (BENADRYL) injection 25 mg (25 mg Intravenous Given 06/12/23 0839)  iohexol (OMNIPAQUE) 350 MG/ML injection 75 mL (75 mLs Intravenous Contrast Given 06/12/23 0907)     IMPRESSION / MDM / ASSESSMENT AND PLAN / ED COURSE  I reviewed the triage vital signs and the nursing notes.                              67 y.o. female with past medical history of hypertension, hyperlipidemia, diabetes, and COPD who presents to the ED with a few days of left posterior neck pain, now with dizziness and vertigo developing overnight.  Patient's presentation is most consistent with acute presentation with potential threat to life or  bodily function.  Differential diagnosis includes, but is not limited to, vertebral dissection, carotid dissection, cervical strain, peripheral vertigo, stroke, anemia, electrolyte abnormality, AKI.  Patient well-appearing and in no acute distress, vital signs are unremarkable.  She reports some dizziness and sensation of the room spinning around her but has no focal neurologic deficits on exam.  She does have left posterior neck pain but no findings concerning for meningitis or soft tissue infection.  We will check CTA of her head and neck for vascular dissection, treat symptomatically with IV Toradol, Compazine, and Benadryl.  If imaging is unremarkable, suspect peripheral vertigo, labs showed no significant anemia, leukocytosis, tract abnormality, or AKI.  EKG shows no evidence of arrhythmia or ischemia.  CTA head and neck is negative for acute process, no evidence of carotid or vertebral dissection.  Patient reports feeling better following Compazine, Benadryl, and Toradol, however she continued to have significant dizziness upon standing.  MRI brain performed and negative for stroke or other acute finding.  Patient able to ambulate with assistance and is appropriate for discharge home with outpatient follow-up.  She was counseled to return to the ED for new or worsening symptoms, patient agrees with plan.      FINAL CLINICAL IMPRESSION(S) / ED DIAGNOSES   Final diagnoses:  Dizziness  Neck pain     Rx / DC Orders   ED Discharge Orders          Ordered    meclizine (ANTIVERT) 25 MG tablet  3 times daily PRN        06/12/23 1203             Note:  This document was prepared using Dragon voice recognition software and may include unintentional dictation errors.   Chesley Noon, MD 06/12/23 [phone]

## 2023-06-15 IMAGING — CT CT ABD-PELV W/ CM
2 of 5 series · 15 of 46 positions shown, 17 images · IV contrast (APPLIED)
Comparison: 01/08/2021

CLINICAL DATA: Generalized abdominal pain and diarrhea.

EXAM:
CT ABDOMEN AND PELVIS WITH CONTRAST
TECHNIQUE: Multidetector CT imaging of the abdomen and pelvis was performed
using the standard protocol following bolus administration of
intravenous contrast.
CONTRAST:  100mL OMNIPAQUE IOHEXOL 350 MG/ML SOLN

[Series 2: routine abd/pel with · axial · 0.78mm/px · z∈[-944,-544]mm · 12 of 92 slices shown, 14 images]
[im 6/92  soft-tissue]
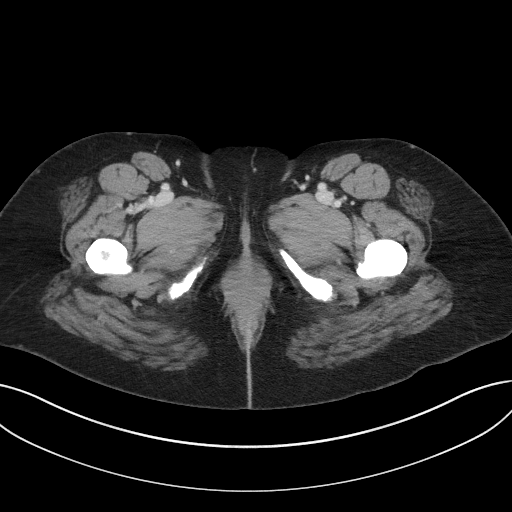
[im 6/92  bone]
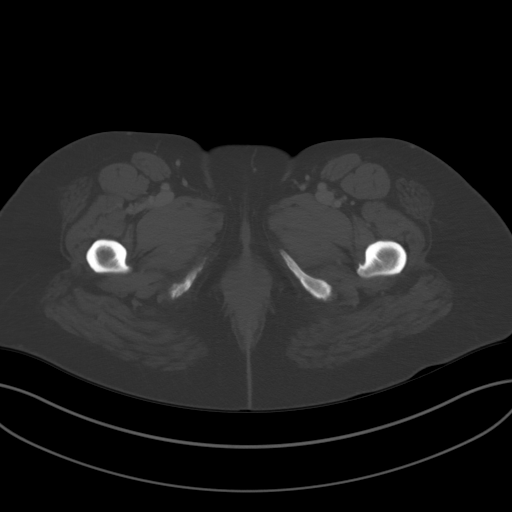
[im 16/92  soft-tissue]
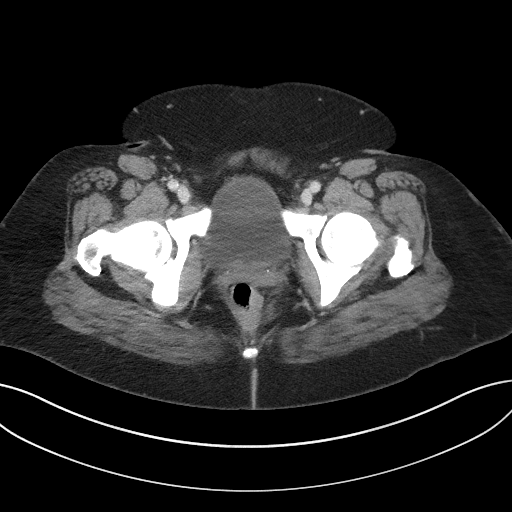
[im 21/92  soft-tissue]
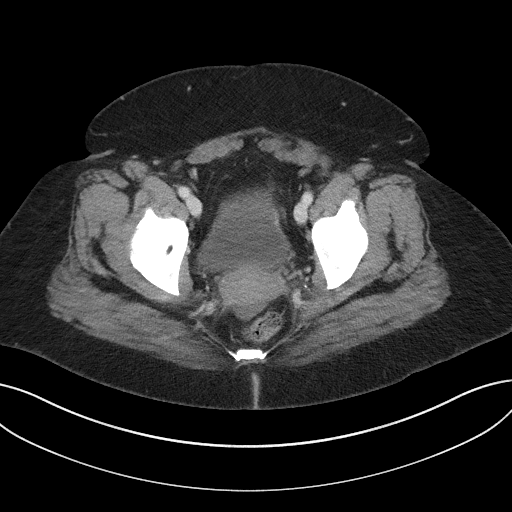
[im 26/92  soft-tissue]
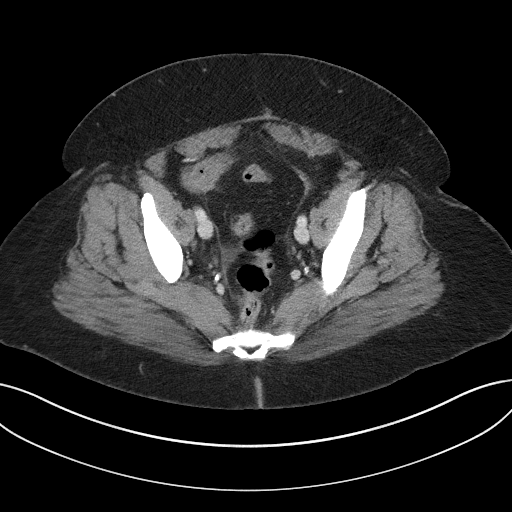
[im 36/92  soft-tissue]
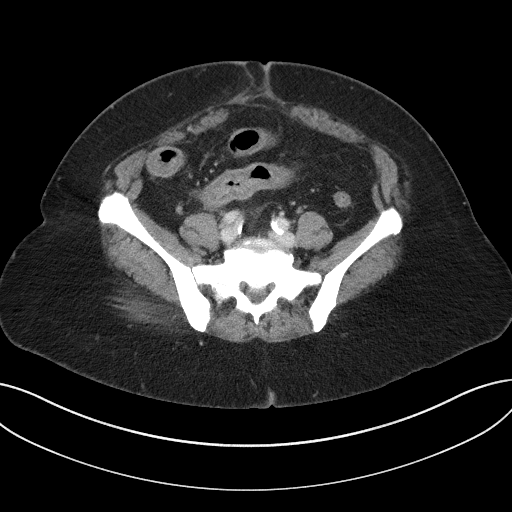
[im 41/92  soft-tissue]
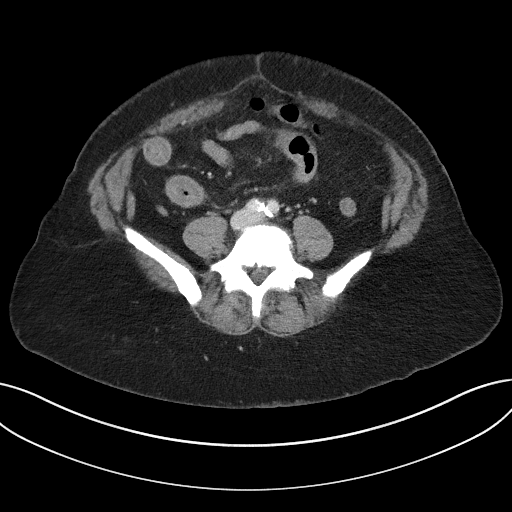
[im 51/92  soft-tissue]
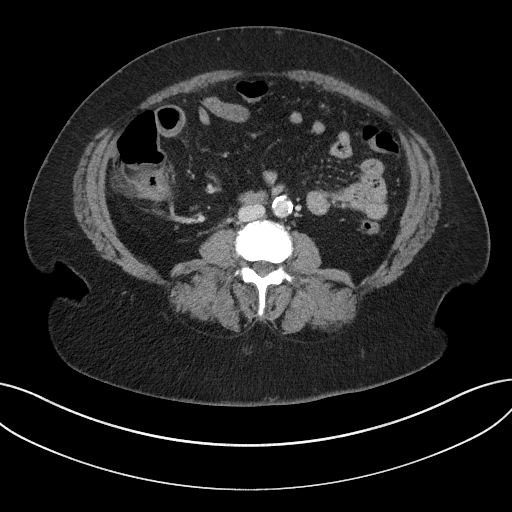
[im 56/92  soft-tissue]
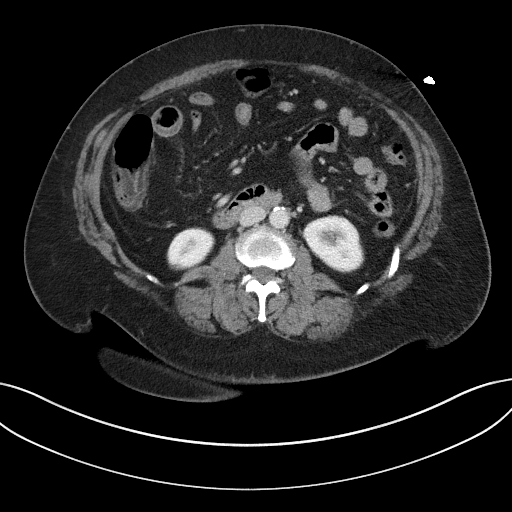
[im 66/92  soft-tissue]
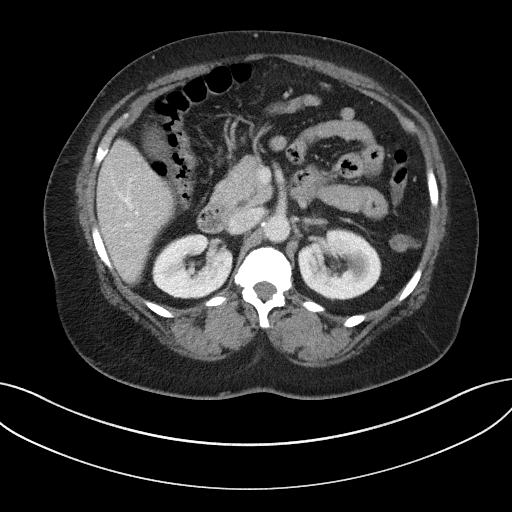
[im 66/92  bone]
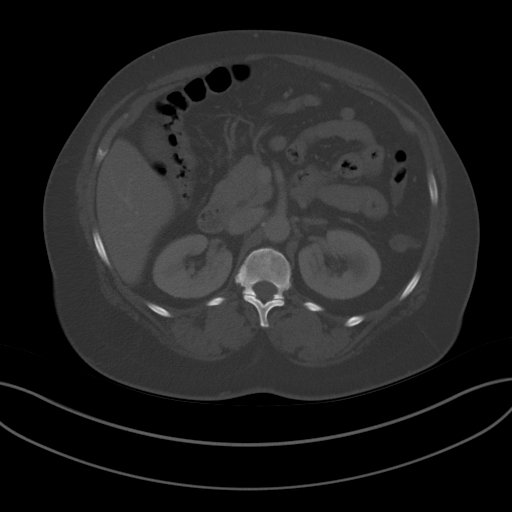
[im 71/92  soft-tissue]
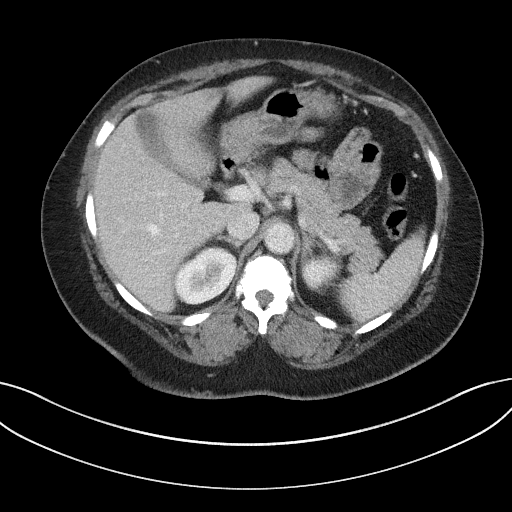
[im 76/92  soft-tissue]
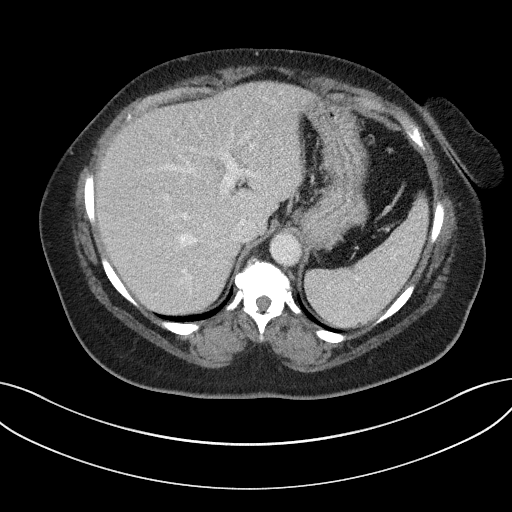
[im 86/92  soft-tissue]
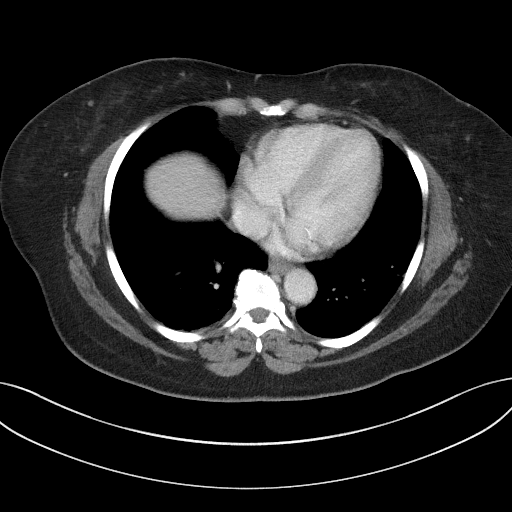

[Series 5: coronal st · coronal · 0.63mm/px · 3 of 95 slices shown]
[im 32/95  soft-tissue]
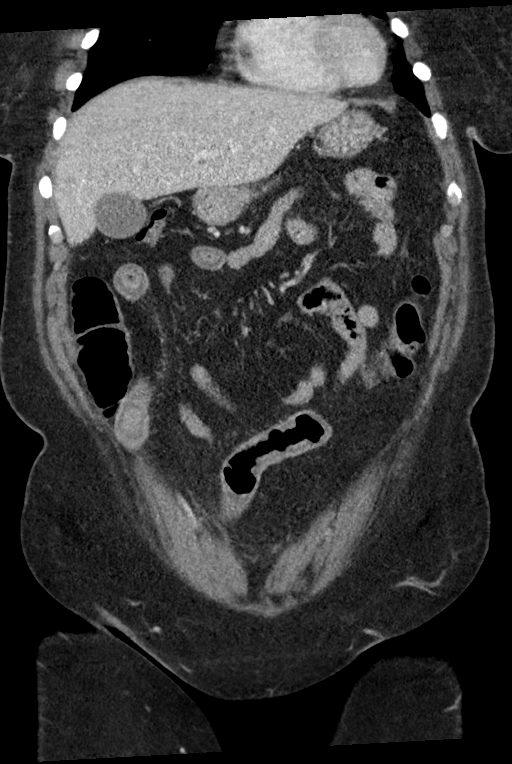
[im 42/95  soft-tissue]
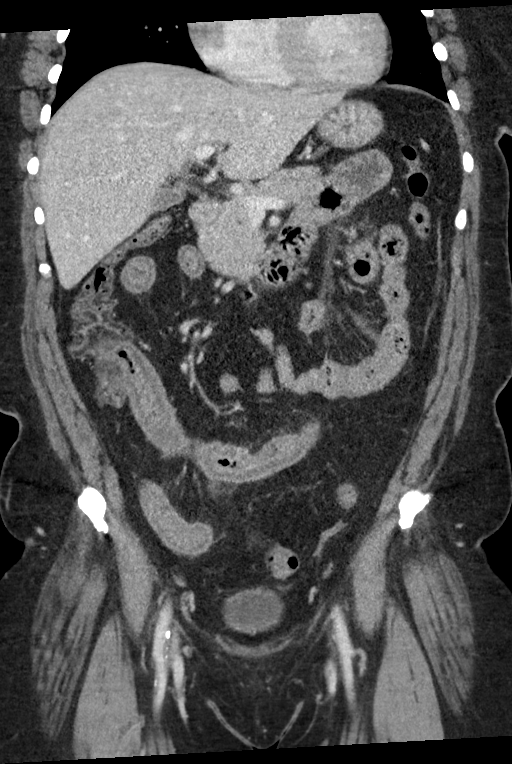
[im 53/95  soft-tissue]
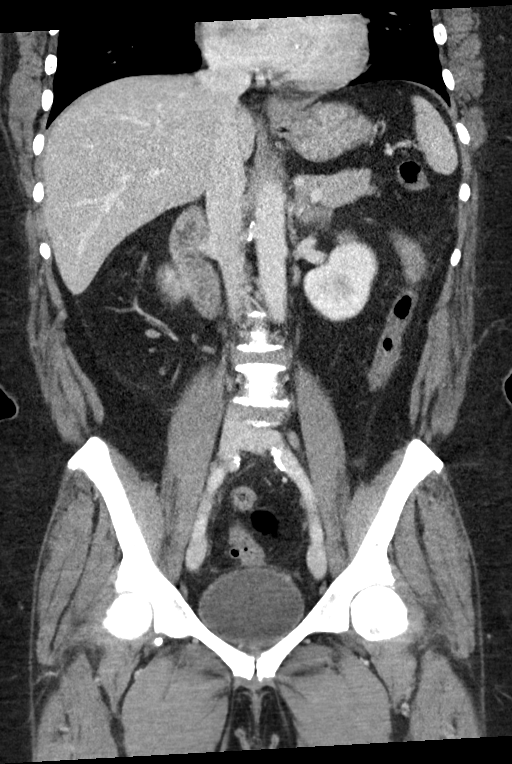

[15 of 46 positions shown; findings below may reference images not displayed]

FINDINGS: Lower chest: Atelectasis in the lung bases. Right lower lung nodule
measuring 4 mm.

Hepatobiliary: Several tiny subcentimeter low-attenuation lesions
are too small to characterize but likely represent small cysts or
hemangiomas. Gallbladder and bile ducts are unremarkable.

Pancreas: Unremarkable. No pancreatic ductal dilatation or
surrounding inflammatory changes.

Spleen: Normal in size without focal abnormality.

Adrenals/Urinary Tract: Adrenal glands are unremarkable. Kidneys are
normal, without renal calculi, focal lesion, or hydronephrosis.
Bladder is unremarkable.

Stomach/Bowel: Stomach, small bowel, and colon are not abnormally
distended. Areas of wall thickening or demonstrated in the small
bowel with particular involvement of the terminal ileum. Changes
suggest enteritis, possibly infectious or inflammatory.

Vascular/Lymphatic: Aortic atherosclerosis. No enlarged abdominal or
pelvic lymph nodes.

Reproductive: Uterus and bilateral adnexa are unremarkable.

Other: Small amount of free fluid in the pelvis may be reactive.
Small periumbilical hernia containing fat. No free air.

Musculoskeletal: No acute or significant osseous findings.
IMPRESSION: Diffuse areas of wall thickening demonstrated in the ileum without
proximal obstruction. Changes likely represent enteritis. Consider
infectious or inflammatory causes.

Small periumbilical hernia containing fat.

Minimal free fluid in the pelvis is likely reactive.

## 2023-08-22 ENCOUNTER — Encounter: Payer: Self-pay | Admitting: Emergency Medicine

## 2023-08-22 ENCOUNTER — Other Ambulatory Visit: Payer: Self-pay

## 2023-08-22 ENCOUNTER — Emergency Department
Admission: EM | Admit: 2023-08-22 | Discharge: 2023-08-22 | Disposition: A | Discharge status: Home / Self Care | Payer: Medicare Other | Attending: Emergency Medicine | Admitting: Emergency Medicine

## 2023-08-22 DIAGNOSIS — S0501XA Injury of conjunctiva and corneal abrasion without foreign body, right eye, initial encounter: Secondary | ICD-10-CM | POA: Diagnosis not present

## 2023-08-22 DIAGNOSIS — J45909 Unspecified asthma, uncomplicated: Secondary | ICD-10-CM | POA: Diagnosis not present

## 2023-08-22 DIAGNOSIS — F17200 Nicotine dependence, unspecified, uncomplicated: Secondary | ICD-10-CM | POA: Diagnosis not present

## 2023-08-22 DIAGNOSIS — I10 Essential (primary) hypertension: Secondary | ICD-10-CM | POA: Diagnosis not present

## 2023-08-22 DIAGNOSIS — J449 Chronic obstructive pulmonary disease, unspecified: Secondary | ICD-10-CM | POA: Diagnosis not present

## 2023-08-22 DIAGNOSIS — X58XXXA Exposure to other specified factors, initial encounter: Secondary | ICD-10-CM | POA: Insufficient documentation

## 2023-08-22 DIAGNOSIS — S0591XA Unspecified injury of right eye and orbit, initial encounter: Secondary | ICD-10-CM | POA: Diagnosis present

## 2023-08-22 MED ORDER — FLUORESCEIN SODIUM 1 MG OP STRP
1.0000 | ORAL_STRIP | Freq: Once | OPHTHALMIC | Status: AC
  Administered 2023-08-22: 1 via OPHTHALMIC
  Filled 2023-08-22: qty 1

## 2023-08-22 MED ORDER — TETRACAINE HCL 0.5 % OP SOLN
1.0000 [drp] | Freq: Once | OPHTHALMIC | Status: AC
  Administered 2023-08-22: 1 [drp] via OPHTHALMIC
  Filled 2023-08-22: qty 4

## 2023-08-22 MED ORDER — EYE WASH OP SOLN
1.0000 [drp] | OPHTHALMIC | Status: DC | PRN
  Filled 2023-08-22: qty 118

## 2023-08-22 MED ORDER — GENTAMICIN SULFATE 0.3 % OP SOLN: 1.0000 [drp] | Freq: Four times a day (QID) | OPHTHALMIC | 0 refills | Status: AC

## 2023-08-22 NOTE — ED Triage Notes (Signed)
Pt here with right eye irritation that started Sat. Pt states she feels like something is in her eye and it is itching and painful. Pt states she is also having light sensitivity. Pt states she has been washing her eye with no relief. Pt unsure if she got something in her eye.

## 2023-08-22 NOTE — ED Notes (Signed)
See triage note  Presents with some irritation to right eye  States this started couple of days ago

## 2023-08-22 NOTE — ED Provider Notes (Signed)
[facility] Provider Note    Event Date/Time   First MD Initiated Contact with Patient 08/22/23 0813     (approximate)   History   Eye Problem   HPI  Kim Munoz is a 67 y.o. female   presents to the ED with complaint of right eye irritation that started on Saturday.  Patient states she feels that there is something in her eye and reports some itching and painful with light sensitivity.  Patient denies any symptoms to her left eye.  No drainage.  Patient has a history of hypertension, asthma, COPD, non-STEMI, tobacco abuse.      Physical Exam   Triage Vital Signs: ED Triage Vitals  Encounter Vitals Group     BP 08/22/23 0808 (!) 163/97     Systolic BP Percentile --      Diastolic BP Percentile --      Pulse Rate 08/22/23 0808 72     Resp 08/22/23 0808 17     Temp 08/22/23 0808 98.2 F (36.8 C)     Temp Source 08/22/23 0808 Oral     SpO2 08/22/23 0808 97 %     Weight 08/22/23 0807 195 lb 15.8 oz (88.9 kg)     Height 08/22/23 0807 5\' 1"  (1.549 m)     Head Circumference --      Peak Flow --      Pain Score 08/22/23 0807 10     Pain Loc --      Pain Education --      Exclude from Growth Chart --     Most recent vital signs: Vitals:   08/22/23 0808  BP: (!) 163/97  Pulse: 72  Resp: 17  Temp: 98.2 F (36.8 C)  SpO2: 97%     General: Awake, no distress.  Talkative, cooperative. CV:  Good peripheral perfusion.   Resp:  Normal effort.  Abd:  No distention.  Other:  Right conjunctival mildly injected, moderate light sensitivity.  Upper lid was everted and no foreign body was noted.  Tetracaine was applied and with fluorescein dye a corneal abrasion is noted with central placement.   ED Results / Procedures / Treatments   Labs (all labs ordered are listed, but only abnormal results are displayed) Labs Reviewed - No data to display   PROCEDURES:  Critical Care performed:   Procedures   MEDICATIONS ORDERED IN ED: Medications   eye wash ((SODIUM/POTASSIUM/SOD CHLORIDE)) ophthalmic solution 1 drop (has no administration in time range)  tetracaine (PONTOCAINE) 0.5 % ophthalmic solution 1 drop (1 drop Right Eye Given by Other 08/22/23 4034)  fluorescein ophthalmic strip 1 strip (1 strip Left Eye Given 08/22/23 0821)     IMPRESSION / MDM / ASSESSMENT AND PLAN / ED COURSE  I reviewed the triage vital signs and the nursing notes.   Differential diagnosis includes, but is not limited to, foreign body right eye, corneal abrasion, conjunctivitis, photosensitivity.  67 year old female presents to the ED with complaint of right foreign body sensation and photophobia sensitivity.  On exam patient has a corneal abrasion.  She is strongly encouraged to follow-up with Dr. Brooke Dare to make sure that this heals properly.  Visual acuity noted.  A prescription for gentamicin ophthalmic was sent to the pharmacy for patient to begin using 4 times daily.  Tylenol or ibuprofen.  Return to the emergency department if any severe worsening of her symptoms.      Patient's presentation is most consistent with acute illness /  injury with system symptoms.  FINAL CLINICAL IMPRESSION(S) / ED DIAGNOSES   Final diagnoses:  Right corneal abrasion, initial encounter     Rx / DC Orders   ED Discharge Orders          Ordered    gentamicin (GARAMYCIN) 0.3 % ophthalmic solution  4 times daily        08/22/23 1324             Note:  This document was prepared using Dragon voice recognition software and may include unintentional dictation errors.   Tommi Rumps, PA-C 08/22/23 0915    Jene Every, MD 08/22/23 [phone]

## 2023-08-22 NOTE — Discharge Instructions (Addendum)
Call today make an appointment with Dr. Brooke Dare at the [facility] for recheck of your right eye.  You have a large corneal abrasion which is interfering with your vision and also causing your to be light sensitive.  It is very important that she follow-up with Dr. Brooke Dare for your eye.  You will need to wear sunglasses if in bright sunlight.  A prescription for gentamicin ophthalmic solution was sent to the pharmacy for you to begin using 4 times a day to your right eye.

## 2023-09-15 ENCOUNTER — Encounter: Payer: Self-pay | Admitting: Cardiovascular Disease

## 2023-09-15 ENCOUNTER — Ambulatory Visit: Payer: Medicare Other | Attending: Cardiovascular Disease | Admitting: Cardiovascular Disease

## 2023-09-15 VITALS — BP 100/70 | HR 84 | Ht 61.0 in | Wt 212.1 lb

## 2023-09-15 DIAGNOSIS — I119 Hypertensive heart disease without heart failure: Secondary | ICD-10-CM

## 2023-09-15 DIAGNOSIS — R079 Chest pain, unspecified: Secondary | ICD-10-CM | POA: Diagnosis not present

## 2023-09-15 DIAGNOSIS — R0602 Shortness of breath: Secondary | ICD-10-CM

## 2023-09-15 DIAGNOSIS — I10 Essential (primary) hypertension: Secondary | ICD-10-CM | POA: Diagnosis not present

## 2023-09-15 NOTE — Patient Instructions (Signed)

## 2023-09-15 NOTE — Progress Notes (Signed)
Cardiology Office Note   Date:  09/15/2023   ID:  Kim Munoz, DOB 26-Mar-1956, MRN [id number]  PCP:  Marisue Ivan, MD  Cardiologist:   Lorine Bears, MD   Chief Complaint  Patient presents with   Follow-up    4 Month f/u c/o chest pain used nitroglycerin 2-3 wks ago,  sob and lower back pain. Meds reviewed verbally with pt.      History of Present Illness: Kim Munoz is a 67 y.o. female who presents for a follow-up visit regarding hypertension. She has known history of essential hypertension, type 2 diabetes, obesity, previous tobacco use, COPD and hyperlipidemia. She was hospitalized in April of this year with chest pain in the setting of severely elevated blood pressure.  Troponin was mildly elevated.  Cardiac catheterization showed minimal nonobstructive coronary artery disease.  Echocardiogram showed normal LV systolic function.  Her symptoms improved with blood pressure control.  She quit smoking. She had been doing reasonably well with rare episodes of spasm in her chest but no chest tightness.  She reports stable exertional dyspnea.  Her blood pressure has been well-controlled on medications and she denies any dizziness.   Past Medical History:  Diagnosis Date   Complication of anesthesia    nausea after colonoscopy   Family history of adverse reaction to anesthesia    sister - nausea aafter surgery   Hypertension    Wears dentures    full upper and lower    Past Surgical History:  Procedure Laterality Date   ABDOMINAL HYSTERECTOMY     COLONOSCOPY     COLONOSCOPY WITH PROPOFOL N/A 01/13/2021   Procedure: COLONOSCOPY WITH PROPOFOL;  Surgeon: Toney Reil, MD;  Location: [facility] ENDOSCOPY;  Service: Gastroenterology;  Laterality: N/A;   GIVENS CAPSULE STUDY N/A 01/27/2021   Procedure: GIVENS CAPSULE STUDY;  Surgeon: Toney Reil, MD;  Location: [facility] ENDOSCOPY;  Service: Gastroenterology;  Laterality: N/A;   HALLUX VALGUS LAPIDUS Right 04/14/2016    Procedure: HALLUX VALGUS LAPIDUS CORRECTION RIGHT FOOT;  Surgeon: Gwyneth Revels, DPM;  Location: [facility];  Service: Podiatry;  Laterality: Right;  WITH POPLITEAL BLOCK   HALLUX VALGUS LAPIDUS Right 08/20/2016   Procedure: HALLUX VALGUS LAPIDUS;  Surgeon: Gwyneth Revels, DPM;  Location: [facility] ORS;  Service: Podiatry;  Laterality: Right;   LEFT HEART CATH AND CORONARY ANGIOGRAPHY N/A 01/17/2023   Procedure: LEFT HEART CATH AND CORONARY ANGIOGRAPHY;  Surgeon: Iran Ouch, MD;  Location: [facility] INVASIVE CV LAB;  Service: Cardiovascular;  Laterality: N/A;     Current Outpatient Medications  Medication Sig Dispense Refill   amLODipine (NORVASC) 10 MG tablet Take 1 tablet (10 mg total) by mouth every morning. 90 tablet 3   aspirin EC 81 MG tablet Take 1 tablet (81 mg total) by mouth daily. Swallow whole. 90 tablet 3   atorvastatin (LIPITOR) 80 MG tablet Take 1 tablet (80 mg total) by mouth daily. 90 tablet 3   carvedilol (COREG) 6.25 MG tablet Take 1 tablet (6.25 mg total) by mouth 2 (two) times daily with a meal. 180 tablet 3   hydrochlorothiazide (HYDRODIURIL) 12.5 MG tablet Take 12.5 mg by mouth daily.     ipratropium-albuterol (DUONEB) 0.5-2.5 (3) MG/3ML SOLN Take 3 mLs by nebulization every 6 (six) hours as needed. 360 mL 0   lisinopril (ZESTRIL) 20 MG tablet Take 1 tablet (20 mg total) by mouth daily. 90 tablet 3   meclizine (ANTIVERT) 25 MG tablet Take 1 tablet (25 mg total) by mouth  3 (three) times daily as needed for dizziness. 12 tablet 0   nitroGLYCERIN (NITROSTAT) 0.4 MG SL tablet Place 1 tablet (0.4 mg total) under the tongue every 5 (five) minutes as needed for chest pain. 25 tablet 3   No current facility-administered medications for this visit.    Allergies:   Patient has no known allergies.    Social History:  The patient  reports that she has quit smoking. Her smoking use included cigarettes. She has a 17.5 pack-year smoking history. She has never used smokeless  tobacco. She reports current alcohol use. She reports that she does not use drugs.   Family History:  The patient's family history includes Kidney disease in her mother; Thyroid cancer in her sister.    ROS:  Please see the history of present illness.   Otherwise, review of systems are positive for none.   All other systems are reviewed and negative.    PHYSICAL EXAM: VS:  BP 100/70 (BP Location: Left Arm, Patient Position: Sitting, Cuff Size: Large)   Pulse 84   Ht 5\' 1"  (1.549 m)   Wt 212 lb 2 oz (96.2 kg)   SpO2 98%   BMI 40.08 kg/m  , BMI Body mass index is 40.08 kg/m. GEN: Well nourished, well developed, in no acute distress  HEENT: normal  Neck: no JVD, carotid bruits, or masses Cardiac: RRR; no murmurs, rubs, or gallops,no edema  Respiratory:  clear to auscultation bilaterally, normal work of breathing GI: soft, nontender, nondistended, + BS MS: no deformity or atrophy  Skin: warm and dry, no rash Neuro:  Strength and sensation are intact Psych: euthymic mood, full affect   EKG:  EKG is ordered today. The ekg ordered today demonstrates : Normal sinus rhythm Cannot rule out Anterior infarct (cited on or before 12-Jun-2023) When compared with ECG of 12-Jun-2023 07:10, No significant change was found    Recent Labs: 01/15/2023: B Natriuretic Peptide 30.1; TSH 1.444 01/17/2023: Magnesium 2.1 05/17/2023: ALT 14 06/12/2023: BUN 15; Creatinine, Ser 0.94; Hemoglobin 13.2; Platelets 283; Potassium 3.7; Sodium 136    Lipid Panel    Component Value Date/Time   CHOL 341 (H) 05/17/2023 0837   TRIG 184 (H) 05/17/2023 0837   HDL 47 05/17/2023 0837   CHOLHDL 7.3 (H) 05/17/2023 0837   CHOLHDL 8.6 01/15/2023 1639   VLDL 37 01/15/2023 1639   LDLCALC 257 (H) 05/17/2023 0837      Wt Readings from Last 3 Encounters:  09/15/23 212 lb 2 oz (96.2 kg)  08/22/23 195 lb 15.8 oz (88.9 kg)  06/12/23 196 lb (88.9 kg)           No data to display            ASSESSMENT AND  PLAN:  1.  Hypertensive heart disease without heart failure: He is doing well overall and appears to be stable since her blood pressure is controlled.  2.  Essential hypertension: Blood pressure is controlled on current medications.  If anything, her blood pressure is in the low side but she denies any dizziness.  3.  Hyperlipidemia: Previous LDL was 257.  Most recent LDL in November showed improvement to 136.  Continue atorvastatin 80 mg daily and will consider adding ezetimibe.    Disposition:   FU with me in 6 months  Signed,  Lorine Bears, MD  09/15/2023 4:29 PM    [facility]

## 2023-09-22 IMAGING — CR DG CHEST 2V
2 series · 2 of 2 positions shown · non-contrast
Comparison: 01/08/2021

CLINICAL DATA: Chest pain

EXAM:
CHEST - 2 VIEW

[chest pa]
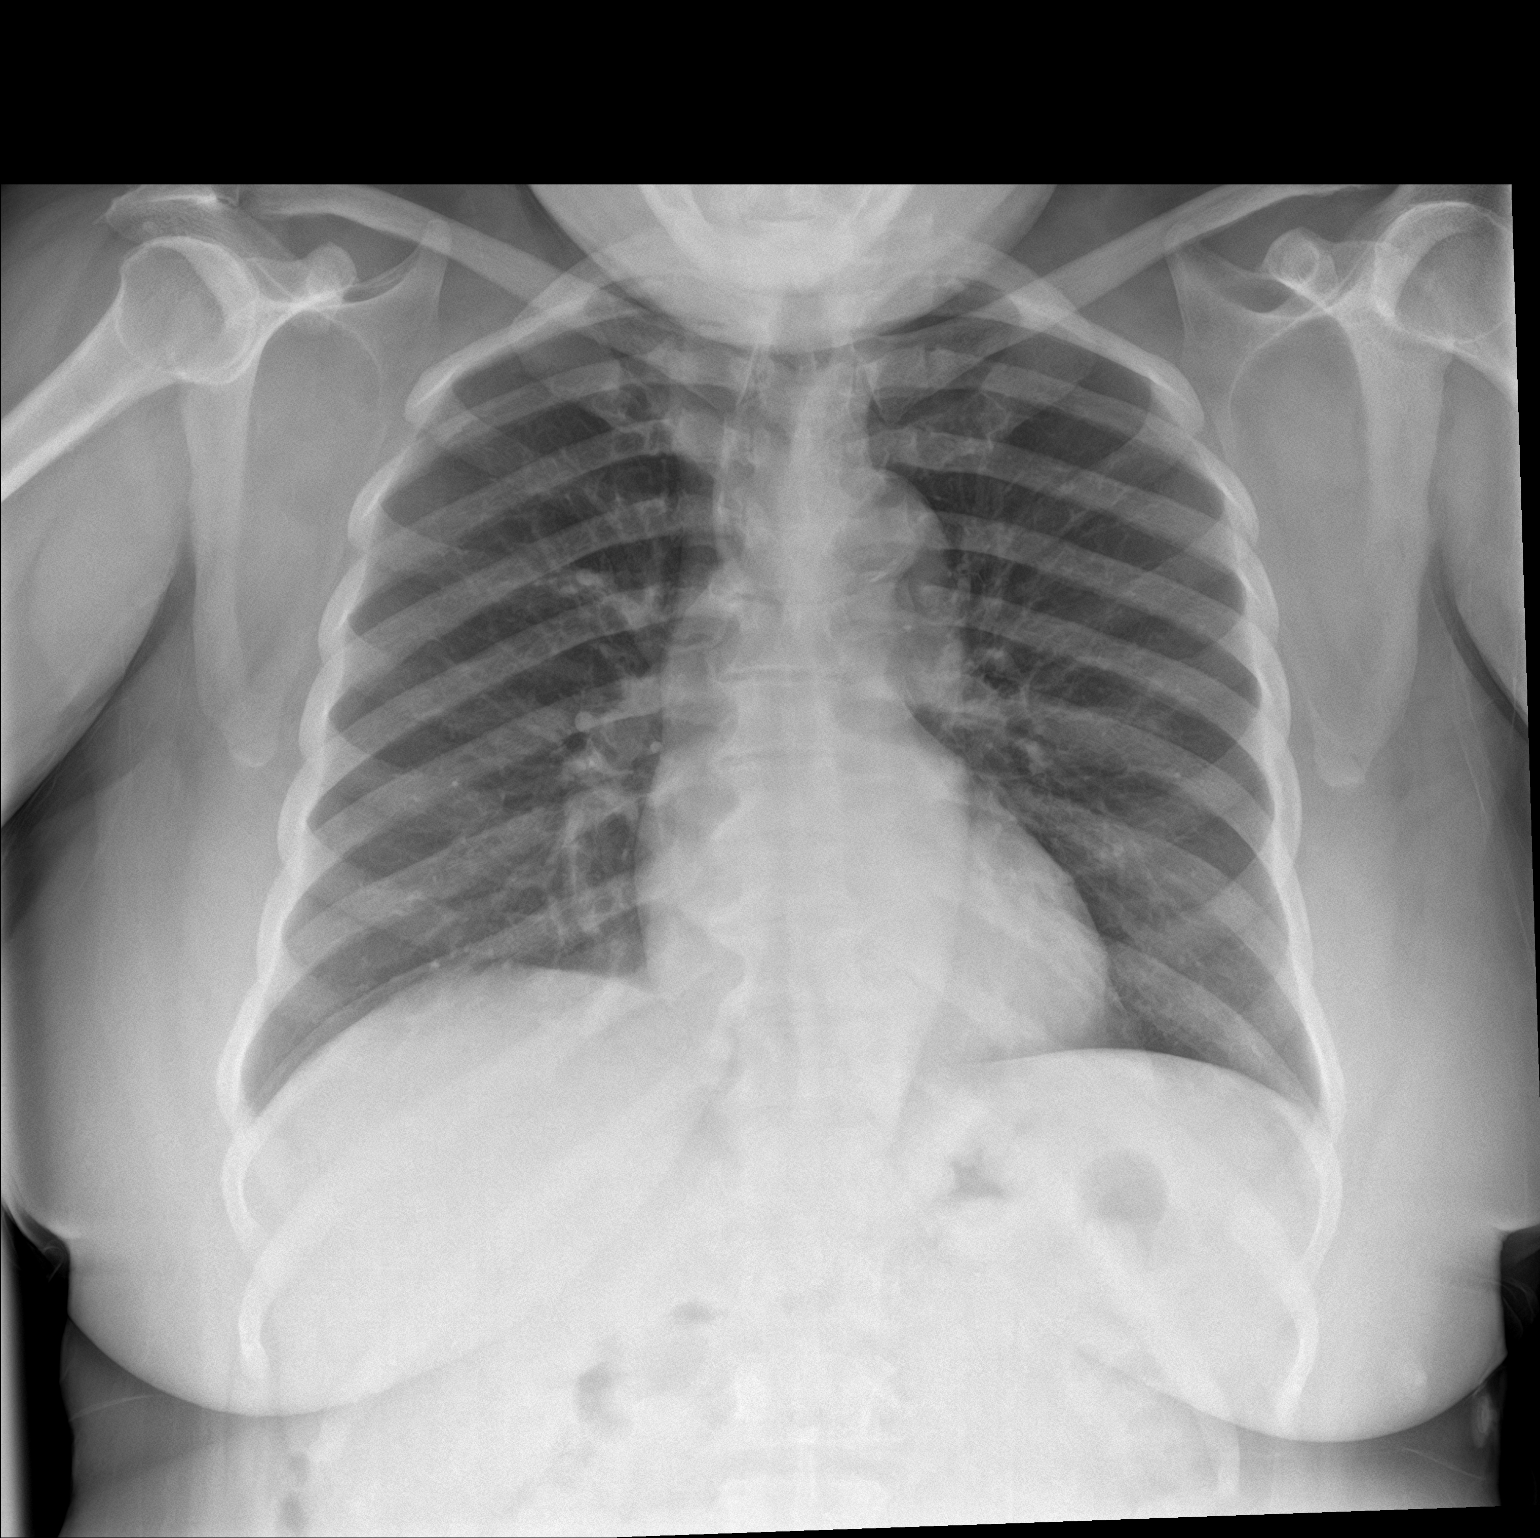

[chest lat]
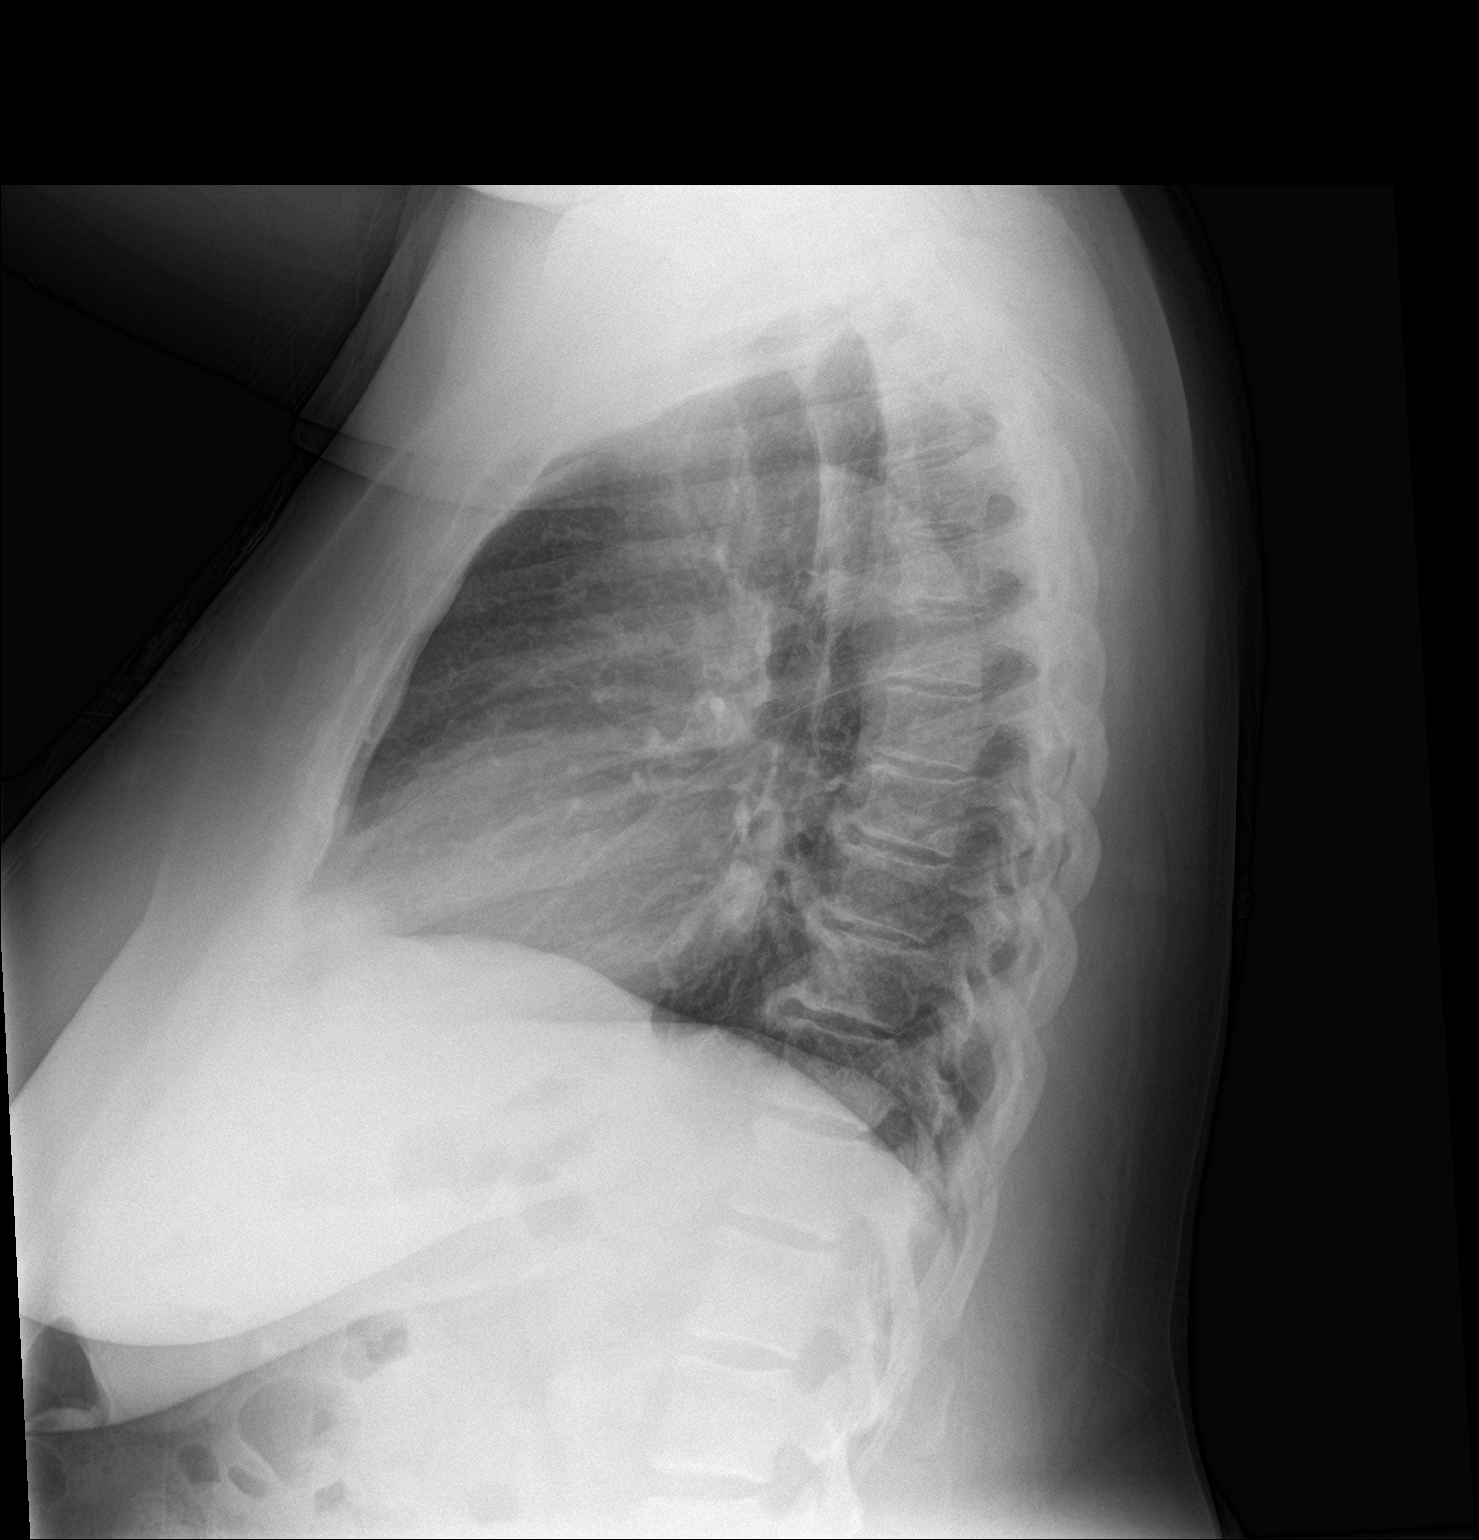

[2 of 2 positions shown; findings below may reference images not displayed]

FINDINGS: The heart size and mediastinal contours are within normal limits.
Both lungs are clear. Disc degenerative disease of the thoracic
spine.
IMPRESSION: No acute abnormality of the lungs.

## 2023-11-22 ENCOUNTER — Other Ambulatory Visit: Payer: Self-pay

## 2023-11-22 ENCOUNTER — Emergency Department

## 2023-11-22 ENCOUNTER — Emergency Department
Admission: EM | Admit: 2023-11-22 | Discharge: 2023-11-22 | Disposition: A | Discharge status: Home / Self Care | Attending: Emergency Medicine | Admitting: Emergency Medicine

## 2023-11-22 DIAGNOSIS — M25511 Pain in right shoulder: Secondary | ICD-10-CM | POA: Diagnosis present

## 2023-11-22 DIAGNOSIS — I10 Essential (primary) hypertension: Secondary | ICD-10-CM | POA: Diagnosis not present

## 2023-11-22 DIAGNOSIS — M7581 Other shoulder lesions, right shoulder: Secondary | ICD-10-CM

## 2023-11-22 DIAGNOSIS — M75101 Unspecified rotator cuff tear or rupture of right shoulder, not specified as traumatic: Secondary | ICD-10-CM | POA: Insufficient documentation

## 2023-11-22 DIAGNOSIS — E119 Type 2 diabetes mellitus without complications: Secondary | ICD-10-CM | POA: Diagnosis not present

## 2023-11-22 DIAGNOSIS — Z79899 Other long term (current) drug therapy: Secondary | ICD-10-CM | POA: Diagnosis not present

## 2023-11-22 DIAGNOSIS — Z7982 Long term (current) use of aspirin: Secondary | ICD-10-CM | POA: Diagnosis not present

## 2023-11-22 DIAGNOSIS — M5412 Radiculopathy, cervical region: Secondary | ICD-10-CM | POA: Diagnosis not present

## 2023-11-22 DIAGNOSIS — J449 Chronic obstructive pulmonary disease, unspecified: Secondary | ICD-10-CM | POA: Insufficient documentation

## 2023-11-22 MED ORDER — ONDANSETRON 4 MG PO TBDP
4.0000 mg | ORAL_TABLET | Freq: Once | ORAL | Status: AC
  Administered 2023-11-22: 4 mg via ORAL
  Filled 2023-11-22: qty 1

## 2023-11-22 MED ORDER — DEXAMETHASONE SODIUM PHOSPHATE 10 MG/ML IJ SOLN
10.0000 mg | Freq: Once | INTRAMUSCULAR | Status: AC
  Administered 2023-11-22: 10 mg via INTRAMUSCULAR
  Filled 2023-11-22: qty 1

## 2023-11-22 MED ORDER — HYDROCODONE-ACETAMINOPHEN 5-325 MG PO TABS: 1.0000 | ORAL_TABLET | Freq: Four times a day (QID) | ORAL | 0 refills | Status: AC | PRN

## 2023-11-22 MED ORDER — OXYCODONE-ACETAMINOPHEN 5-325 MG PO TABS
1.0000 | ORAL_TABLET | Freq: Once | ORAL | Status: AC
  Administered 2023-11-22: 1 via ORAL
  Filled 2023-11-22: qty 1

## 2023-11-22 MED ORDER — PREDNISONE 10 MG PO TABS: 10.0000 mg | ORAL_TABLET | Freq: Every day | ORAL | 0 refills | Status: AC

## 2023-11-22 NOTE — Discharge Instructions (Addendum)
 Please take medications as prescribed.  Return to the ER for any worsening symptoms or any urgent changes in your health.  Call orthopedic office to schedule follow-up appointment to recheck your neck and right shoulder.

## 2023-11-22 NOTE — ED Triage Notes (Signed)
 Pt to ED for L neck, shoulder and arm pain since Sunday (2d). No known injury. Pt grimacing.

## 2023-11-22 NOTE — ED Provider Notes (Signed)
 [place] EMERGENCY DEPARTMENT AT [facility] Provider Note   CSN: [id number] Arrival date & time: 11/22/23  1434     History  Chief Complaint  Patient presents with   Neck Pain   Arm Pain   Shoulder Pain    Kim Munoz is a 68 y.o. female.  With history of hypertension, hyperlipidemia, diabetes, COPD presents to the emergency department for evaluation of right shoulder pain, numbness, tingling.  She has had symptoms for 2 days.  She describes numbness and tingling going down her right shoulder and into the right thumb.  She also has pain with shoulder range of motion and neck range of motion.  No trauma or injury.  No fevers or chills.  No chest pain or shortness of breath.  She does not take any medications for her symptoms.  HPI     Home Medications Prior to Admission medications   Medication Sig Start Date End Date Taking? Authorizing Provider  HYDROcodone-acetaminophen (NORCO/VICODIN) 5-325 MG tablet Take 1 tablet by mouth every 6 (six) hours as needed for moderate pain (pain score 4-6). 11/22/23  Yes Evon Slack, PA-C  predniSONE (DELTASONE) 10 MG tablet Take 1 tablet (10 mg total) by mouth daily. 6,5,4,3,2,1 six day taper 11/22/23  Yes Evon Slack, PA-C  amLODipine (NORVASC) 10 MG tablet Take 1 tablet (10 mg total) by mouth every morning. 05/17/23   Furth, Cadence H, PA-C  aspirin EC 81 MG tablet Take 1 tablet (81 mg total) by mouth daily. Swallow whole. 02/03/23   Furth, Cadence H, PA-C  atorvastatin (LIPITOR) 80 MG tablet Take 1 tablet (80 mg total) by mouth daily. 05/17/23 09/15/23  Furth, Cadence H, PA-C  carvedilol (COREG) 6.25 MG tablet Take 1 tablet (6.25 mg total) by mouth 2 (two) times daily with a meal. 05/17/23   Furth, Cadence H, PA-C  hydrochlorothiazide (HYDRODIURIL) 12.5 MG tablet Take 12.5 mg by mouth daily.    [provider]  ipratropium-albuterol (DUONEB) 0.5-2.5 (3) MG/3ML SOLN Take 3 mLs by nebulization every 6 (six) hours as needed.  06/04/22   Burnadette Pop, MD  lisinopril (ZESTRIL) 20 MG tablet Take 1 tablet (20 mg total) by mouth daily. 05/17/23 09/15/23  Furth, Cadence H, PA-C  meclizine (ANTIVERT) 25 MG tablet Take 1 tablet (25 mg total) by mouth 3 (three) times daily as needed for dizziness. 06/12/23   Chesley Noon, MD  nitroGLYCERIN (NITROSTAT) 0.4 MG SL tablet Place 1 tablet (0.4 mg total) under the tongue every 5 (five) minutes as needed for chest pain. 05/17/23 09/15/23  Furth, Cadence H, PA-C      Allergies    Patient has no known allergies.    Review of Systems   Review of Systems  Physical Exam Updated Vital Signs BP 132/75   Pulse 68   Temp 98.4 F (36.9 C) (Oral)   Resp 18   Ht 5\' 1"  (1.549 m)   Wt 89.4 kg   SpO2 100%   BMI 37.22 kg/m  Physical Exam Constitutional:      Appearance: She is well-developed.  HENT:     Head: Normocephalic and atraumatic.  Eyes:     Conjunctiva/sclera: Conjunctivae normal.  Cardiovascular:     Rate and Rhythm: Normal rate.  Pulmonary:     Effort: Pulmonary effort is normal. No respiratory distress.  Musculoskeletal:        General: Normal range of motion.     Cervical back: Normal range of motion.     Comments: Normal  sensation throughout the right upper extremity.  Grip strength 5 out of 5.  She has 4-5 bicep and tricep strength.  Normal sensation and 2+ radial pulse.  She is nontender along the cervical spinous process.  She has normal neck range of motion but does have a positive Spurling's test to the right.  She has painful right shoulder external rotation as well as internal rotation.  She has limited external rotation of the right shoulder.  Positive Hawkins and impingement test right shoulder.  Limited active and passive range of motion with abduction and flexion past 90 degrees.  Skin:    General: Skin is warm.     Capillary Refill: Capillary refill takes less than 2 seconds.     Findings: No rash.  Neurological:     General: No focal deficit  present.     Mental Status: She is alert and oriented to person, place, and time.     Cranial Nerves: No cranial nerve deficit.     Motor: No weakness.     Gait: Gait normal.  Psychiatric:        Behavior: Behavior normal.        Thought Content: Thought content normal.     ED Results / Procedures / Treatments   Labs (all labs ordered are listed, but only abnormal results are displayed) Labs Reviewed - No data to display  EKG None  Radiology DG Cervical Spine 2-3 Views Result Date: 11/22/2023 CLINICAL DATA:  Left-sided neck pain EXAM: CERVICAL SPINE - 3 VIEW COMPARISON:  None Available. FINDINGS: There is no evidence of cervical spine fracture or prevertebral soft tissue swelling. Alignment is normal. Degenerative changes of the cervical spine characterized by intervertebral disc space narrowing and anterior disc osteophyte formation involving all levels. IMPRESSION: Multilevel degenerative changes of the cervical spine. Electronically Signed   By: Agustin Cree M.D.   On: 11/22/2023 18:57   DG Shoulder Right Result Date: 11/22/2023 CLINICAL DATA:  Pain without trauma EXAM: RIGHT SHOULDER - 2+ VIEW COMPARISON:  None Available. FINDINGS: Calcification in the rotator cuff tendon. Degenerative changes of the acromioclavicular joint. Visualized portion of the right hemithorax is normal. IMPRESSION: No acute osseous abnormality. Chronic calcific tendinitis of the rotator cuff. Electronically Signed   By: Jeronimo Greaves M.D.   On: 11/22/2023 18:57    Procedures Procedures    Medications Ordered in ED Medications  dexamethasone (DECADRON) injection 10 mg (10 mg Intramuscular Given 11/22/23 1658)  oxyCODONE-acetaminophen (PERCOCET/ROXICET) 5-325 MG per tablet 1 tablet (1 tablet Oral Given 11/22/23 1657)  ondansetron (ZOFRAN-ODT) disintegrating tablet 4 mg (4 mg Oral Given 11/22/23 1836)    ED Course/ Medical Decision Making/ A&P                                 Medical Decision Making Amount  and/or Complexity of Data Reviewed Radiology: ordered.  Risk Prescription drug management.   68 year old female with right shoulder pain.  Nontraumatic.  She has numbness tingling and burning pain going down her right shoulder into the thumb.  Symptoms have been gradual for the last few days.  Her vital signs are stable, afebrile.  She has no neurological deficit.  She has stiffness with the shoulder with internal and external rotation, some concern for adhesive capsulitis.  Positive Spurling's test to the right.  X-rays negative for any acute bony abnormality of the cervical spine, no significant spondylolisthesis.  Right shoulder x-rays negative  for fracture dislocation but does have some calcifications along the rotator cuff.  Patient is given prescription for Norco and prednisone.  She will follow-up with orthopedics.  She is given a note to remain out of work.  She understands signs symptoms return to the ED for. Final Clinical Impression(s) / ED Diagnoses Final diagnoses:  Right cervical radiculopathy  Rotator cuff tendinitis, right    Rx / DC Orders ED Discharge Orders          Ordered    predniSONE (DELTASONE) 10 MG tablet  Daily        11/22/23 1808    HYDROcodone-acetaminophen (NORCO/VICODIN) 5-325 MG tablet  Every 6 hours PRN        11/22/23 1808              Evon Slack, PA-C 11/22/23 1904    Corena Herter, MD 11/23/23 0009

## 2024-03-13 ENCOUNTER — Encounter: Payer: Self-pay | Admitting: Cardiovascular Disease

## 2024-03-13 ENCOUNTER — Ambulatory Visit: Payer: Medicare Other | Attending: Cardiovascular Disease | Admitting: Cardiovascular Disease

## 2024-03-13 VITALS — BP 110/78 | HR 80 | Ht 61.0 in | Wt 202.0 lb

## 2024-03-13 DIAGNOSIS — I10 Essential (primary) hypertension: Secondary | ICD-10-CM | POA: Diagnosis not present

## 2024-03-13 DIAGNOSIS — I119 Hypertensive heart disease without heart failure: Secondary | ICD-10-CM

## 2024-03-13 DIAGNOSIS — E7801 Familial hypercholesterolemia: Secondary | ICD-10-CM

## 2024-03-13 MED ORDER — EZETIMIBE 10 MG PO TABS: 10.0000 mg | ORAL_TABLET | Freq: Every day | ORAL | 3 refills | Status: AC

## 2024-03-13 NOTE — Patient Instructions (Signed)
 Medication Instructions:  Your physician recommends the following medication changes.  START TAKING: Zetia 10 mg once daily  *If you need a refill on your cardiac medications before your next appointment, please call your pharmacy*  Lab Work: None ordered at this time  If you have labs (blood work) drawn today and your tests are completely normal, you will receive your results only by: MyChart Message (if you have MyChart) OR A paper copy in the mail If you have any lab test that is abnormal or we need to change your treatment, we will call you to review the results.  Testing/Procedures: None ordered at this time   Follow-Up: At [facility], you and your health needs are our priority.  As part of our continuing mission to provide you with exceptional heart care, our providers are all part of one team.  This team includes your primary Cardiologist (physician) and Advanced Practice Providers or APPs (Physician Assistants and Nurse Practitioners) who all work together to provide you with the care you need, when you need it.  Your next appointment:   6 month(s)  Provider:   You will see one of the following Advanced Practice Providers on your designated Care Team:   Lonni Meager, NP Lesley Maffucci, PA-C Bernardino Bring, PA-C Cadence Humboldt, PA-C Tylene Lunch, NP Barnie Hila, NP We recommend signing up for the patient portal called MyChart.  Sign up information is provided on this After Visit Summary.  MyChart is used to connect with patients for Virtual Visits (Telemedicine).  Patients are able to view lab/test results, encounter notes, upcoming appointments, etc.  Non-urgent messages can be sent to your provider as well.   To learn more about what you can do with MyChart, go to [email or web address].

## 2024-03-13 NOTE — Progress Notes (Signed)
 Cardiology Office Note   Date:  03/13/2024   ID:  Kim Munoz, DOB 1955/10/23, MRN [id number]  PCP:  Alla Amis, MD  Cardiologist:   Deatrice Cage, MD   Chief Complaint  Patient presents with   Follow-up    6 month f/u no complaints today. Meds reviewed verbally with pt.      History of Present Illness: Kim Munoz is a 68 y.o. female who presents for a follow-up visit regarding hypertensive heart disease.   She has known history of essential hypertension, type 2 diabetes, obesity, previous tobacco use, COPD and hyperlipidemia. She was hospitalized in April of 2024 with chest pain in the setting of severely elevated blood pressure.  Troponin was mildly elevated.  Cardiac catheterization showed minimal nonobstructive coronary artery disease.  Echocardiogram showed normal LV systolic function.  Her symptoms improved with blood pressure control.  She quit smoking.  Her blood pressure has been well-controlled on medications.  She has been doing very well with no chest pain, shortness of breath or palpitations.  She improved her lifestyle significantly and was able to lose 12 pounds.    Past Medical History:  Diagnosis Date   Complication of anesthesia    nausea after colonoscopy   Family history of adverse reaction to anesthesia    sister - nausea aafter surgery   Hypertension    NSTEMI (non-ST elevated myocardial infarction) [facility]) 2024   summer of 2024   Wears dentures    full upper and lower    Past Surgical History:  Procedure Laterality Date   ABDOMINAL HYSTERECTOMY     COLONOSCOPY     COLONOSCOPY WITH PROPOFOL  N/A 01/13/2021   Procedure: COLONOSCOPY WITH PROPOFOL ;  Surgeon: Unk Corinn Skiff, MD;  Location: [facility] ENDOSCOPY;  Service: Gastroenterology;  Laterality: N/A;   GIVENS CAPSULE STUDY N/A 01/27/2021   Procedure: GIVENS CAPSULE STUDY;  Surgeon: Unk Corinn Skiff, MD;  Location: [facility] ENDOSCOPY;  Service: Gastroenterology;  Laterality: N/A;   HALLUX  VALGUS LAPIDUS Right 04/14/2016   Procedure: HALLUX VALGUS LAPIDUS CORRECTION RIGHT FOOT;  Surgeon: Eva Gay, DPM;  Location:  [facility];  Service: Podiatry;  Laterality: Right;  WITH POPLITEAL BLOCK   HALLUX VALGUS LAPIDUS Right 08/20/2016   Procedure: HALLUX VALGUS LAPIDUS;  Surgeon: Eva Gay, DPM;  Location: [facility] ORS;  Service: Podiatry;  Laterality: Right;   LEFT HEART CATH AND CORONARY ANGIOGRAPHY N/A 01/17/2023   Procedure: LEFT HEART CATH AND CORONARY ANGIOGRAPHY;  Surgeon: Cage Deatrice LABOR, MD;  Location: [facility] INVASIVE CV LAB;  Service: Cardiovascular;  Laterality: N/A;     Current Outpatient Medications  Medication Sig Dispense Refill   amLODipine  (NORVASC ) 10 MG tablet Take 1 tablet (10 mg total) by mouth every morning. 90 tablet 3   aspirin  EC 81 MG tablet Take 1 tablet (81 mg total) by mouth daily. Swallow whole. 90 tablet 3   atorvastatin  (LIPITOR ) 80 MG tablet Take 1 tablet (80 mg total) by mouth daily. 90 tablet 3   carvedilol  (COREG ) 6.25 MG tablet Take 1 tablet (6.25 mg total) by mouth 2 (two) times daily with a meal. 180 tablet 3   hydrochlorothiazide  (HYDRODIURIL ) 12.5 MG tablet Take 12.5 mg by mouth daily.     ipratropium-albuterol  (DUONEB) 0.5-2.5 (3) MG/3ML SOLN Take 3 mLs by nebulization every 6 (six) hours as needed. 360 mL 0   lisinopril  (ZESTRIL ) 20 MG tablet Take 1 tablet (20 mg total) by mouth daily. 90 tablet 3   meclizine  (ANTIVERT ) 25 MG tablet Take  1 tablet (25 mg total) by mouth 3 (three) times daily as needed for dizziness. 12 tablet 0   HYDROcodone -acetaminophen  (NORCO/VICODIN) 5-325 MG tablet Take 1 tablet by mouth every 6 (six) hours as needed for moderate pain (pain score 4-6). 15 tablet 0   nitroGLYCERIN  (NITROSTAT ) 0.4 MG SL tablet Place 1 tablet (0.4 mg total) under the tongue every 5 (five) minutes as needed for chest pain. (Patient not taking: Reported on 03/13/2024) 25 tablet 3   predniSONE  (DELTASONE ) 10 MG tablet Take 1 tablet (10 mg  total) by mouth daily. 6,5,4,3,2,1 six day taper 21 tablet 0   No current facility-administered medications for this visit.    Allergies:   Patient has no known allergies.    Social History:  The patient  reports that she has quit smoking. Her smoking use included cigarettes. She has a 17.5 pack-year smoking history. She has never used smokeless tobacco. She reports current alcohol use. She reports that she does not use drugs.   Family History:  The patient's family history includes Kidney disease in her mother; Thyroid  cancer in her sister.    ROS:  Please see the history of present illness.   Otherwise, review of systems are positive for none.   All other systems are reviewed and negative.    PHYSICAL EXAM: VS:  BP 110/78 (BP Location: Left Arm, Patient Position: Sitting, Cuff Size: Large)   Pulse 80   Ht 5' 1 (1.549 m)   Wt 202 lb (91.6 kg)   SpO2 98%   BMI 38.17 kg/m  , BMI Body mass index is 38.17 kg/m. GEN: Well nourished, well developed, in no acute distress  HEENT: normal  Neck: no JVD, carotid bruits, or masses Cardiac: RRR; no murmurs, rubs, or gallops,no edema  Respiratory:  clear to auscultation bilaterally, normal work of breathing GI: soft, nontender, nondistended, + BS MS: no deformity or atrophy  Skin: warm and dry, no rash Neuro:  Strength and sensation are intact Psych: euthymic mood, full affect   EKG:  EKG is ordered today. The ekg ordered today demonstrates : Normal sinus rhythm with sinus arrhythmia Cannot rule out Anterior infarct (cited on or before 12-Jun-2023) When compared with ECG of 15-Sep-2023 16:22, Nonspecific T wave abnormality no longer evident in Lateral leads    Recent Labs: 05/17/2023: ALT 14 06/12/2023: BUN 15; Creatinine, Ser 0.94; Hemoglobin 13.2; Platelets 283; Potassium 3.7; Sodium 136    Lipid Panel    Component Value Date/Time   CHOL 341 (H) 05/17/2023 0837   TRIG 184 (H) 05/17/2023 0837   HDL 47 05/17/2023 0837    CHOLHDL 7.3 (H) 05/17/2023 0837   CHOLHDL 8.6 01/15/2023 1639   VLDL 37 01/15/2023 1639   LDLCALC 257 (H) 05/17/2023 0837      Wt Readings from Last 3 Encounters:  03/13/24 202 lb (91.6 kg)  11/22/23 197 lb (89.4 kg)  09/15/23 212 lb 2 oz (96.2 kg)           No data to display            ASSESSMENT AND PLAN:  1.  Hypertensive heart disease without heart failure: He is doing well overall and appears to be stable since her blood pressure is controlled.  2.  Essential hypertension: Blood pressure is controlled on current medications.    3.  Hyperlipidemia: Previous LDL was 257.  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  She has been on atorvastatin  80 mg once daily and most recent lipid  profile showed an LDL of 150.  Given that she is diabetic, recommend a target LDL of less than 70.      Disposition:   FU with me in 6 months  Signed,  Deatrice Cage, MD  03/13/2024 3:07 PM    [facility]

## 2024-05-17 ENCOUNTER — Other Ambulatory Visit: Payer: Self-pay | Admitting: Medical
